# Patient Record
Sex: Female | Born: 1960 | Race: Black or African American | Hispanic: No | Marital: Married | Smoking: Never smoker
Health system: Southern US, Community
[De-identification: ages and names within clinical notes are randomized; demographics above are authoritative.]

## PROBLEM LIST (undated history)

## (undated) DIAGNOSIS — J45909 Unspecified asthma, uncomplicated: Secondary | ICD-10-CM

## (undated) DIAGNOSIS — K219 Gastro-esophageal reflux disease without esophagitis: Secondary | ICD-10-CM

## (undated) DIAGNOSIS — M1990 Unspecified osteoarthritis, unspecified site: Secondary | ICD-10-CM

## (undated) DIAGNOSIS — I10 Essential (primary) hypertension: Secondary | ICD-10-CM

## (undated) HISTORY — PX: ABDOMINAL HYSTERECTOMY: SHX81

## (undated) HISTORY — DX: Unspecified osteoarthritis, unspecified site: M19.90

---

## 1998-09-07 ENCOUNTER — Emergency Department (HOSPITAL_COMMUNITY): Admission: EM | Admit: 1998-09-07 | Discharge: 1998-09-07 | Payer: Self-pay | Admitting: Emergency Medicine

## 1999-07-11 ENCOUNTER — Inpatient Hospital Stay (HOSPITAL_COMMUNITY): Admission: AD | Admit: 1999-07-11 | Discharge: 1999-07-11 | Payer: Self-pay | Admitting: Obstetrics

## 1999-07-14 ENCOUNTER — Inpatient Hospital Stay (HOSPITAL_COMMUNITY): Admission: AD | Admit: 1999-07-14 | Discharge: 1999-07-14 | Payer: Self-pay | Admitting: *Deleted

## 1999-07-20 ENCOUNTER — Other Ambulatory Visit: Admission: RE | Admit: 1999-07-20 | Discharge: 1999-07-20 | Payer: Self-pay | Admitting: Obstetrics

## 1999-11-20 ENCOUNTER — Inpatient Hospital Stay (HOSPITAL_COMMUNITY): Admission: EM | Admit: 1999-11-20 | Discharge: 1999-11-20 | Payer: Self-pay | Admitting: Obstetrics

## 2000-02-20 ENCOUNTER — Inpatient Hospital Stay (HOSPITAL_COMMUNITY): Admission: AD | Admit: 2000-02-20 | Discharge: 2000-02-22 | Payer: Self-pay | Admitting: Obstetrics

## 2003-07-29 ENCOUNTER — Emergency Department (HOSPITAL_COMMUNITY): Admission: EM | Admit: 2003-07-29 | Discharge: 2003-07-29 | Payer: Self-pay | Admitting: Emergency Medicine

## 2006-04-25 ENCOUNTER — Emergency Department (HOSPITAL_COMMUNITY): Admission: EM | Admit: 2006-04-25 | Discharge: 2006-04-25 | Payer: Self-pay | Admitting: *Deleted

## 2006-12-01 ENCOUNTER — Ambulatory Visit (HOSPITAL_COMMUNITY): Admission: RE | Admit: 2006-12-01 | Discharge: 2006-12-01 | Payer: Self-pay | Admitting: Obstetrics

## 2007-02-10 ENCOUNTER — Emergency Department (HOSPITAL_COMMUNITY): Admission: EM | Admit: 2007-02-10 | Discharge: 2007-02-10 | Payer: Self-pay | Admitting: Emergency Medicine

## 2008-10-03 ENCOUNTER — Emergency Department (HOSPITAL_COMMUNITY): Admission: EM | Admit: 2008-10-03 | Discharge: 2008-10-03 | Payer: Self-pay | Admitting: Emergency Medicine

## 2008-12-27 ENCOUNTER — Emergency Department (HOSPITAL_BASED_OUTPATIENT_CLINIC_OR_DEPARTMENT_OTHER): Admission: EM | Admit: 2008-12-27 | Discharge: 2008-12-27 | Payer: Self-pay | Admitting: Emergency Medicine

## 2009-01-30 ENCOUNTER — Emergency Department (HOSPITAL_COMMUNITY): Admission: EM | Admit: 2009-01-30 | Discharge: 2009-01-30 | Payer: Self-pay | Admitting: Emergency Medicine

## 2009-08-25 ENCOUNTER — Emergency Department (HOSPITAL_BASED_OUTPATIENT_CLINIC_OR_DEPARTMENT_OTHER): Admission: EM | Admit: 2009-08-25 | Discharge: 2009-08-25 | Payer: Self-pay | Admitting: Emergency Medicine

## 2009-09-23 ENCOUNTER — Emergency Department (HOSPITAL_COMMUNITY): Admission: EM | Admit: 2009-09-23 | Discharge: 2009-09-23 | Payer: Self-pay | Admitting: Emergency Medicine

## 2009-10-14 ENCOUNTER — Emergency Department (HOSPITAL_BASED_OUTPATIENT_CLINIC_OR_DEPARTMENT_OTHER): Admission: EM | Admit: 2009-10-14 | Discharge: 2009-10-14 | Payer: Self-pay | Admitting: Emergency Medicine

## 2009-10-18 ENCOUNTER — Emergency Department (HOSPITAL_BASED_OUTPATIENT_CLINIC_OR_DEPARTMENT_OTHER): Admission: EM | Admit: 2009-10-18 | Discharge: 2009-10-18 | Payer: Self-pay | Admitting: Emergency Medicine

## 2009-10-25 ENCOUNTER — Encounter: Payer: Self-pay | Admitting: Emergency Medicine

## 2009-10-25 ENCOUNTER — Ambulatory Visit: Payer: Self-pay | Admitting: Diagnostic Radiology

## 2009-10-25 ENCOUNTER — Inpatient Hospital Stay (HOSPITAL_COMMUNITY): Admission: AD | Admit: 2009-10-25 | Discharge: 2009-10-25 | Payer: Self-pay | Admitting: Obstetrics & Gynecology

## 2009-10-28 ENCOUNTER — Emergency Department (HOSPITAL_COMMUNITY): Admission: EM | Admit: 2009-10-28 | Discharge: 2009-10-28 | Payer: Self-pay | Admitting: Family Medicine

## 2009-10-30 ENCOUNTER — Ambulatory Visit: Payer: Self-pay | Admitting: Obstetrics & Gynecology

## 2009-10-30 LAB — CONVERTED CEMR LAB
CA 125: 10.5 units/mL (ref 0.0–30.2)
Pap Smear: NEGATIVE

## 2009-12-15 ENCOUNTER — Ambulatory Visit: Payer: Self-pay | Admitting: Obstetrics & Gynecology

## 2009-12-15 ENCOUNTER — Inpatient Hospital Stay (HOSPITAL_COMMUNITY): Admission: RE | Admit: 2009-12-15 | Discharge: 2009-12-17 | Payer: Self-pay | Admitting: Obstetrics & Gynecology

## 2009-12-15 ENCOUNTER — Encounter: Payer: Self-pay | Admitting: Obstetrics & Gynecology

## 2010-01-02 ENCOUNTER — Inpatient Hospital Stay (HOSPITAL_COMMUNITY): Admission: AD | Admit: 2010-01-02 | Discharge: 2010-01-02 | Payer: Self-pay | Admitting: Obstetrics & Gynecology

## 2010-01-06 ENCOUNTER — Emergency Department (HOSPITAL_COMMUNITY): Admission: EM | Admit: 2010-01-06 | Discharge: 2010-01-06 | Payer: Self-pay | Admitting: Emergency Medicine

## 2010-02-13 ENCOUNTER — Inpatient Hospital Stay (HOSPITAL_COMMUNITY): Admission: AD | Admit: 2010-02-13 | Discharge: 2010-02-13 | Payer: Self-pay | Admitting: Obstetrics & Gynecology

## 2010-02-17 ENCOUNTER — Emergency Department (HOSPITAL_BASED_OUTPATIENT_CLINIC_OR_DEPARTMENT_OTHER): Admission: EM | Admit: 2010-02-17 | Discharge: 2010-02-17 | Payer: Self-pay | Admitting: Emergency Medicine

## 2010-05-26 ENCOUNTER — Ambulatory Visit: Payer: Self-pay | Admitting: Internal Medicine

## 2010-05-26 ENCOUNTER — Encounter (INDEPENDENT_AMBULATORY_CARE_PROVIDER_SITE_OTHER): Payer: Self-pay | Admitting: Family Medicine

## 2010-05-26 LAB — CONVERTED CEMR LAB: Microalb, Ur: 3.64 mg/dL — ABNORMAL HIGH (ref 0.00–1.89)

## 2010-06-16 ENCOUNTER — Encounter (INDEPENDENT_AMBULATORY_CARE_PROVIDER_SITE_OTHER): Payer: Self-pay | Admitting: Family Medicine

## 2010-06-16 LAB — CONVERTED CEMR LAB
ALT: 8 units/L (ref 0–35)
AST: 14 units/L (ref 0–37)
Albumin: 4 g/dL (ref 3.5–5.2)
Alkaline Phosphatase: 104 units/L (ref 39–117)
BUN: 11 mg/dL (ref 6–23)
Basophils Absolute: 0 10*3/uL (ref 0.0–0.1)
Basophils Relative: 0 % (ref 0–1)
CO2: 26 meq/L (ref 19–32)
Calcium: 9.5 mg/dL (ref 8.4–10.5)
Chloride: 103 meq/L (ref 96–112)
Cholesterol: 181 mg/dL (ref 0–200)
Creatinine, Ser: 0.55 mg/dL (ref 0.40–1.20)
Eosinophils Absolute: 0.5 10*3/uL (ref 0.0–0.7)
Eosinophils Relative: 6 % — ABNORMAL HIGH (ref 0–5)
Glucose, Bld: 84 mg/dL (ref 70–99)
HCT: 36.7 % (ref 36.0–46.0)
HDL: 51 mg/dL (ref >39)
Hemoglobin: 11.2 g/dL — ABNORMAL LOW (ref 12.0–15.0)
LDL Cholesterol: 103 mg/dL — ABNORMAL HIGH (ref 0–99)
Lymphocytes Relative: 50 % — ABNORMAL HIGH (ref 12–46)
Lymphs Abs: 4.2 10*3/uL — ABNORMAL HIGH (ref 0.7–4.0)
MCHC: 30.5 g/dL (ref 30.0–36.0)
MCV: 80.3 fL (ref 78.0–100.0)
Monocytes Absolute: 0.4 10*3/uL (ref 0.1–1.0)
Monocytes Relative: 5 % (ref 3–12)
Neutro Abs: 3.2 10*3/uL (ref 1.7–7.7)
Neutrophils Relative %: 39 % — ABNORMAL LOW (ref 43–77)
Platelets: 282 10*3/uL (ref 150–400)
Potassium: 4.1 meq/L (ref 3.5–5.3)
RBC: 4.57 M/uL (ref 3.87–5.11)
RDW: 19 % — ABNORMAL HIGH (ref 11.5–15.5)
Sodium: 143 meq/L (ref 135–145)
Total Bilirubin: 0.5 mg/dL (ref 0.3–1.2)
Total CHOL/HDL Ratio: 3.5
Total Protein: 7.4 g/dL (ref 6.0–8.3)
Triglycerides: 137 mg/dL (ref <150)
VLDL: 27 mg/dL (ref 0–40)
WBC: 8.3 10*3/uL (ref 4.0–10.5)

## 2010-07-01 ENCOUNTER — Encounter (INDEPENDENT_AMBULATORY_CARE_PROVIDER_SITE_OTHER): Payer: Self-pay | Admitting: *Deleted

## 2010-07-01 LAB — CONVERTED CEMR LAB: Ferritin: 7 ng/mL — ABNORMAL LOW (ref 10–291)

## 2010-07-21 ENCOUNTER — Encounter (INDEPENDENT_AMBULATORY_CARE_PROVIDER_SITE_OTHER): Payer: Self-pay | Admitting: Family Medicine

## 2010-07-21 LAB — CONVERTED CEMR LAB
BUN: 10 mg/dL (ref 6–23)
CO2: 29 meq/L (ref 19–32)
Calcium: 9.8 mg/dL (ref 8.4–10.5)
Chloride: 103 meq/L (ref 96–112)
Creatinine, Ser: 0.57 mg/dL (ref 0.40–1.20)
Glucose, Bld: 104 mg/dL — ABNORMAL HIGH (ref 70–99)
Potassium: 4 meq/L (ref 3.5–5.3)
Sodium: 142 meq/L (ref 135–145)

## 2010-08-05 ENCOUNTER — Ambulatory Visit: Payer: Self-pay | Admitting: Internal Medicine

## 2010-09-04 ENCOUNTER — Inpatient Hospital Stay (HOSPITAL_COMMUNITY)
Admission: AD | Admit: 2010-09-04 | Discharge: 2010-09-04 | Payer: Self-pay | Source: Home / Self Care | Attending: Obstetrics & Gynecology | Admitting: Obstetrics & Gynecology

## 2010-09-29 ENCOUNTER — Encounter
Admission: RE | Admit: 2010-09-29 | Discharge: 2010-09-29 | Payer: Self-pay | Source: Home / Self Care | Attending: Family Medicine | Admitting: Family Medicine

## 2010-10-01 ENCOUNTER — Encounter (INDEPENDENT_AMBULATORY_CARE_PROVIDER_SITE_OTHER): Payer: Self-pay | Admitting: Family Medicine

## 2010-10-01 ENCOUNTER — Ambulatory Visit (HOSPITAL_COMMUNITY)
Admission: RE | Admit: 2010-10-01 | Discharge: 2010-10-01 | Payer: Self-pay | Source: Home / Self Care | Attending: Family Medicine | Admitting: Family Medicine

## 2010-11-24 ENCOUNTER — Encounter (INDEPENDENT_AMBULATORY_CARE_PROVIDER_SITE_OTHER): Payer: Self-pay | Admitting: *Deleted

## 2010-11-24 LAB — CONVERTED CEMR LAB
BUN: 12 mg/dL (ref 6–23)
CO2: 30 meq/L (ref 19–32)
Calcium: 9.5 mg/dL (ref 8.4–10.5)
Chloride: 101 meq/L (ref 96–112)
Creatinine, Ser: 0.67 mg/dL (ref 0.40–1.20)
Glucose, Bld: 124 mg/dL — ABNORMAL HIGH (ref 70–99)
Potassium: 3.1 meq/L — ABNORMAL LOW (ref 3.5–5.3)
Sodium: 142 meq/L (ref 135–145)

## 2010-11-26 ENCOUNTER — Inpatient Hospital Stay (HOSPITAL_COMMUNITY)
Admission: AD | Admit: 2010-11-26 | Discharge: 2010-11-26 | Disposition: A | Discharge status: Home / Self Care | Payer: Self-pay | Source: Ambulatory Visit | Attending: Obstetrics & Gynecology | Admitting: Obstetrics & Gynecology

## 2010-11-26 DIAGNOSIS — N644 Mastodynia: Secondary | ICD-10-CM

## 2010-12-06 LAB — STOOL CULTURE

## 2010-12-06 LAB — COMPREHENSIVE METABOLIC PANEL
ALT: 9 U/L (ref 0–35)
Alkaline Phosphatase: 111 U/L (ref 39–117)
CO2: 31 mEq/L (ref 19–32)
Chloride: 101 mEq/L (ref 96–112)
GFR calc non Af Amer: >60 mL/min (ref >60)
Glucose, Bld: 95 mg/dL (ref 70–99)
Potassium: 3.2 mEq/L — ABNORMAL LOW (ref 3.5–5.1)
Sodium: 144 mEq/L (ref 135–145)
Total Bilirubin: 0.4 mg/dL (ref 0.3–1.2)

## 2010-12-06 LAB — CBC
HCT: 35.2 % — ABNORMAL LOW (ref 36.0–46.0)
Hemoglobin: 11.3 g/dL — ABNORMAL LOW (ref 12.0–15.0)
MCHC: 32 g/dL (ref 30.0–36.0)
MCV: 80.4 fL (ref 78.0–100.0)
Platelets: 327 10*3/uL (ref 150–400)
RBC: 4.38 MIL/uL (ref 3.87–5.11)
RDW: 16.5 % — ABNORMAL HIGH (ref 11.5–15.5)
WBC: 10.4 10*3/uL (ref 4.0–10.5)

## 2010-12-06 LAB — DIFFERENTIAL
Basophils Absolute: 0 10*3/uL (ref 0.0–0.1)
Basophils Relative: 0 % (ref 0–1)
Eosinophils Absolute: 0.6 10*3/uL (ref 0.0–0.7)
Monocytes Relative: 4 % (ref 3–12)
Neutrophils Relative %: 47 % (ref 43–77)

## 2010-12-06 LAB — CLOSTRIDIUM DIFFICILE EIA

## 2010-12-07 LAB — URINE MICROSCOPIC-ADD ON

## 2010-12-07 LAB — COMPREHENSIVE METABOLIC PANEL
AST: 17 U/L (ref 0–37)
Albumin: 3.6 g/dL (ref 3.5–5.2)
Calcium: 9 mg/dL (ref 8.4–10.5)
Creatinine, Ser: 0.56 mg/dL (ref 0.4–1.2)
GFR calc Af Amer: >60 mL/min (ref >60)
Total Protein: 7.2 g/dL (ref 6.0–8.3)

## 2010-12-07 LAB — URINALYSIS, ROUTINE W REFLEX MICROSCOPIC
Glucose, UA: NEGATIVE mg/dL
Leukocytes, UA: NEGATIVE
Protein, ur: NEGATIVE mg/dL
Urobilinogen, UA: 0.2 mg/dL (ref 0.0–1.0)

## 2010-12-07 LAB — CBC
MCHC: 32.7 g/dL (ref 30.0–36.0)
MCV: 79.3 fL (ref 78.0–100.0)
Platelets: 256 10*3/uL (ref 150–400)
WBC: 9.7 10*3/uL (ref 4.0–10.5)

## 2010-12-07 LAB — URINE CULTURE: Colony Count: 35000

## 2010-12-08 LAB — URINALYSIS, ROUTINE W REFLEX MICROSCOPIC
Ketones, ur: NEGATIVE mg/dL
Leukocytes, UA: NEGATIVE
Nitrite: NEGATIVE
Protein, ur: NEGATIVE mg/dL

## 2010-12-08 LAB — CBC
HCT: 32.7 % — ABNORMAL LOW (ref 36.0–46.0)
MCHC: 31.9 g/dL (ref 30.0–36.0)
MCV: 79.2 fL (ref 78.0–100.0)
RBC: 4.13 MIL/uL (ref 3.87–5.11)

## 2010-12-09 LAB — DIFFERENTIAL
Eosinophils Absolute: 0.6 10*3/uL (ref 0.0–0.7)
Eosinophils Relative: 7 % — ABNORMAL HIGH (ref 0–5)
Lymphocytes Relative: 41 % (ref 12–46)
Lymphs Abs: 3.7 10*3/uL (ref 0.7–4.0)
Monocytes Absolute: 0.6 10*3/uL (ref 0.1–1.0)
Monocytes Relative: 7 % (ref 3–12)

## 2010-12-09 LAB — BASIC METABOLIC PANEL
BUN: 7 mg/dL (ref 6–23)
Chloride: 103 mEq/L (ref 96–112)
GFR calc Af Amer: >60 mL/min (ref >60)
GFR calc non Af Amer: >60 mL/min (ref >60)
Potassium: 3.8 mEq/L (ref 3.5–5.1)
Sodium: 145 mEq/L (ref 135–145)

## 2010-12-09 LAB — CBC
HCT: 33.3 % — ABNORMAL LOW (ref 36.0–46.0)
Hemoglobin: 11 g/dL — ABNORMAL LOW (ref 12.0–15.0)
MCV: 79.6 fL (ref 78.0–100.0)
RBC: 4.18 MIL/uL (ref 3.87–5.11)
WBC: 8.9 10*3/uL (ref 4.0–10.5)

## 2010-12-09 LAB — URINALYSIS, ROUTINE W REFLEX MICROSCOPIC
Bilirubin Urine: NEGATIVE
Glucose, UA: NEGATIVE mg/dL
Ketones, ur: NEGATIVE mg/dL
Leukocytes, UA: NEGATIVE
Protein, ur: NEGATIVE mg/dL

## 2010-12-09 LAB — URINE MICROSCOPIC-ADD ON

## 2010-12-11 LAB — WET PREP, GENITAL
Clue Cells Wet Prep HPF POC: NONE SEEN
Trich, Wet Prep: NONE SEEN

## 2010-12-11 LAB — POCT PREGNANCY, URINE: Preg Test, Ur: NEGATIVE

## 2010-12-14 LAB — COMPREHENSIVE METABOLIC PANEL
AST: 16 U/L (ref 0–37)
Albumin: 3.7 g/dL (ref 3.5–5.2)
Alkaline Phosphatase: 102 U/L (ref 39–117)
BUN: 5 mg/dL — ABNORMAL LOW (ref 6–23)
Chloride: 101 mEq/L (ref 96–112)
Creatinine, Ser: 0.57 mg/dL (ref 0.4–1.2)
GFR calc Af Amer: >60 mL/min (ref >60)
Potassium: 3.5 mEq/L (ref 3.5–5.1)
Total Bilirubin: 0.4 mg/dL (ref 0.3–1.2)
Total Protein: 8.1 g/dL (ref 6.0–8.3)

## 2010-12-14 LAB — CBC
HCT: 27.3 % — ABNORMAL LOW (ref 36.0–46.0)
HCT: 34.5 % — ABNORMAL LOW (ref 36.0–46.0)
Hemoglobin: 8.9 g/dL — ABNORMAL LOW (ref 12.0–15.0)
MCV: 79.7 fL (ref 78.0–100.0)
MCV: 80.5 fL (ref 78.0–100.0)
Platelets: 260 10*3/uL (ref 150–400)
Platelets: 295 10*3/uL (ref 150–400)
RBC: 3.39 MIL/uL — ABNORMAL LOW (ref 3.87–5.11)
RBC: 3.58 MIL/uL — ABNORMAL LOW (ref 3.87–5.11)
RDW: 18.1 % — ABNORMAL HIGH (ref 11.5–15.5)
WBC: 15.1 10*3/uL — ABNORMAL HIGH (ref 4.0–10.5)
WBC: 9.4 10*3/uL (ref 4.0–10.5)
WBC: 9.7 10*3/uL (ref 4.0–10.5)

## 2010-12-14 LAB — TYPE AND SCREEN
ABO/RH(D): O NEG
Antibody Screen: NEGATIVE

## 2010-12-14 LAB — ABO/RH: ABO/RH(D): O NEG

## 2010-12-17 ENCOUNTER — Encounter: Payer: Self-pay | Admitting: Internal Medicine

## 2010-12-17 LAB — CONVERTED CEMR LAB
Potassium: 3.4 meq/L — ABNORMAL LOW (ref 3.5–5.3)
Sodium: 142 meq/L (ref 135–145)

## 2010-12-24 ENCOUNTER — Other Ambulatory Visit (HOSPITAL_COMMUNITY): Payer: Self-pay | Admitting: *Deleted

## 2010-12-24 ENCOUNTER — Other Ambulatory Visit (HOSPITAL_COMMUNITY): Payer: Self-pay | Admitting: Cardiology

## 2010-12-24 DIAGNOSIS — Z0389 Encounter for observation for other suspected diseases and conditions ruled out: Secondary | ICD-10-CM

## 2010-12-24 DIAGNOSIS — R079 Chest pain, unspecified: Secondary | ICD-10-CM

## 2010-12-24 DIAGNOSIS — I10 Essential (primary) hypertension: Secondary | ICD-10-CM

## 2010-12-24 DIAGNOSIS — C762 Malignant neoplasm of abdomen: Secondary | ICD-10-CM

## 2010-12-29 LAB — URINALYSIS, ROUTINE W REFLEX MICROSCOPIC
Glucose, UA: NEGATIVE mg/dL
Leukocytes, UA: NEGATIVE
Protein, ur: NEGATIVE mg/dL
pH: 5.5 (ref 5.0–8.0)

## 2010-12-29 LAB — CBC
HCT: 37 % (ref 36.0–46.0)
MCHC: 32.1 g/dL (ref 30.0–36.0)
Platelets: 262 10*3/uL (ref 150–400)
RDW: 17.4 % — ABNORMAL HIGH (ref 11.5–15.5)

## 2010-12-29 LAB — COMPREHENSIVE METABOLIC PANEL
CO2: 28 mEq/L (ref 19–32)
Calcium: 9.5 mg/dL (ref 8.4–10.5)
Creatinine, Ser: 0.61 mg/dL (ref 0.4–1.2)
GFR calc non Af Amer: >60 mL/min (ref >60)
Glucose, Bld: 105 mg/dL — ABNORMAL HIGH (ref 70–99)

## 2010-12-29 LAB — POCT PREGNANCY, URINE: Preg Test, Ur: NEGATIVE

## 2010-12-29 LAB — URINE MICROSCOPIC-ADD ON

## 2010-12-29 LAB — LIPASE, BLOOD: Lipase: 21 U/L (ref 11–59)

## 2011-01-04 LAB — URINALYSIS, ROUTINE W REFLEX MICROSCOPIC
Leukocytes, UA: NEGATIVE
Protein, ur: NEGATIVE mg/dL
Urobilinogen, UA: 0.2 mg/dL (ref 0.0–1.0)

## 2011-01-04 LAB — URINE MICROSCOPIC-ADD ON

## 2011-01-07 ENCOUNTER — Encounter (HOSPITAL_COMMUNITY)
Admission: RE | Admit: 2011-01-07 | Discharge: 2011-01-07 | Disposition: A | Discharge status: Home / Self Care | Payer: Self-pay | Source: Ambulatory Visit | Attending: Cardiology | Admitting: Cardiology

## 2011-01-07 ENCOUNTER — Ambulatory Visit (HOSPITAL_COMMUNITY): Payer: Self-pay

## 2011-01-07 DIAGNOSIS — E669 Obesity, unspecified: Secondary | ICD-10-CM | POA: Insufficient documentation

## 2011-01-07 DIAGNOSIS — Z0389 Encounter for observation for other suspected diseases and conditions ruled out: Secondary | ICD-10-CM

## 2011-01-07 DIAGNOSIS — I10 Essential (primary) hypertension: Secondary | ICD-10-CM | POA: Insufficient documentation

## 2011-01-07 DIAGNOSIS — R011 Cardiac murmur, unspecified: Secondary | ICD-10-CM | POA: Insufficient documentation

## 2011-01-07 DIAGNOSIS — R079 Chest pain, unspecified: Secondary | ICD-10-CM | POA: Insufficient documentation

## 2011-01-07 MED ORDER — TECHNETIUM TC 99M TETROFOSMIN IV KIT
10.0000 | PACK | Freq: Once | INTRAVENOUS | Status: AC | PRN
  Administered 2011-01-07: 10 via INTRAVENOUS

## 2011-01-07 MED ORDER — TECHNETIUM TC 99M TETROFOSMIN IV KIT
30.0000 | PACK | Freq: Once | INTRAVENOUS | Status: AC | PRN
  Administered 2011-01-07: 30 via INTRAVENOUS

## 2011-08-23 ENCOUNTER — Other Ambulatory Visit (HOSPITAL_COMMUNITY): Payer: Self-pay | Admitting: Family Medicine

## 2011-08-23 DIAGNOSIS — Z1231 Encounter for screening mammogram for malignant neoplasm of breast: Secondary | ICD-10-CM

## 2011-09-04 IMAGING — CR DG NECK SOFT TISSUE
1 series · 1 of 1 positions shown · non-contrast
Comparison: None

CLINICAL DATA: Sore throat.

NECK SOFT TISSUES - 1+ VIEW

[w soft tissue neck *]
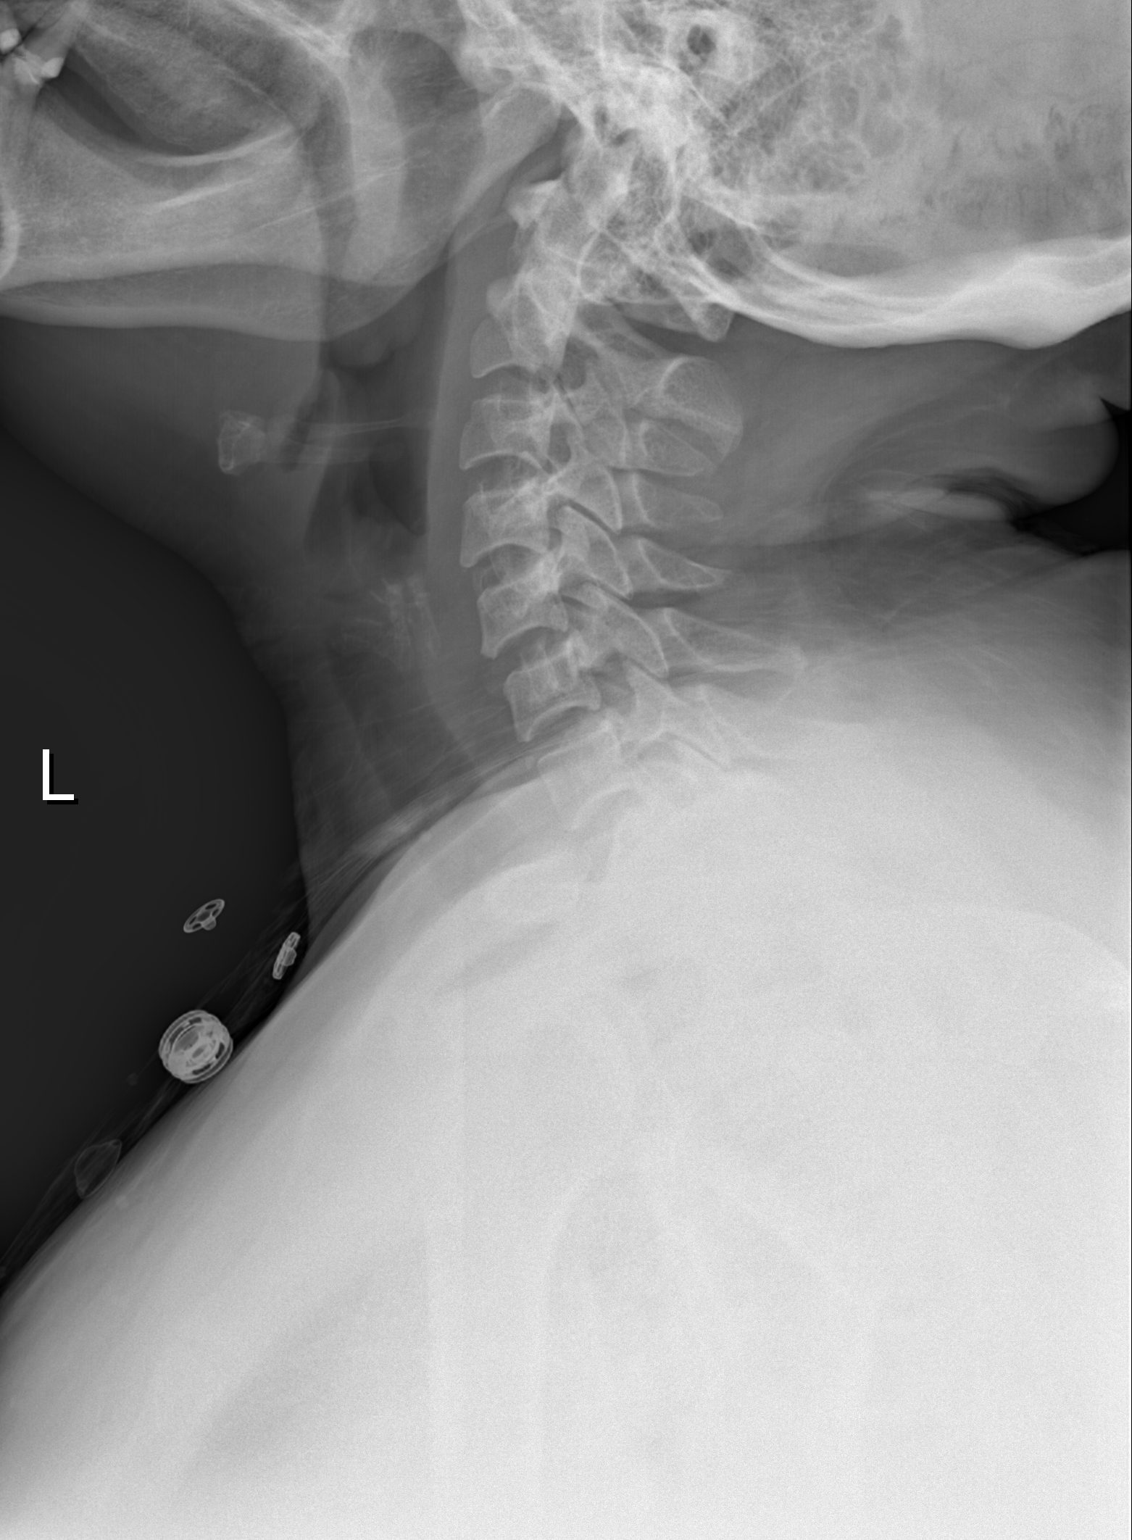

[1 of 1 positions shown; findings below may reference images not displayed]

FINDINGS: There is no abnormal prevertebral soft tissue swelling.
The epiglottis and base of the tongue are normal.  There is slight
prominence of the tonsils.

The bony structures of the cervical spine appear normal.
IMPRESSION: Slightly prominent tonsils.  Otherwise normal exam.

## 2011-10-04 ENCOUNTER — Ambulatory Visit (HOSPITAL_COMMUNITY)
Admission: RE | Admit: 2011-10-04 | Discharge: 2011-10-04 | Disposition: A | Discharge status: Home / Self Care | Payer: Self-pay | Source: Ambulatory Visit | Attending: Family Medicine | Admitting: Family Medicine

## 2011-10-04 DIAGNOSIS — Z1231 Encounter for screening mammogram for malignant neoplasm of breast: Secondary | ICD-10-CM | POA: Insufficient documentation

## 2012-02-16 ENCOUNTER — Other Ambulatory Visit: Payer: Self-pay | Admitting: Family Medicine

## 2012-02-16 ENCOUNTER — Ambulatory Visit (HOSPITAL_COMMUNITY)
Admission: RE | Admit: 2012-02-16 | Discharge: 2012-02-16 | Disposition: A | Discharge status: Home / Self Care | Payer: Self-pay | Source: Ambulatory Visit | Attending: Family Medicine | Admitting: Family Medicine

## 2012-02-16 DIAGNOSIS — M25579 Pain in unspecified ankle and joints of unspecified foot: Secondary | ICD-10-CM | POA: Insufficient documentation

## 2012-02-16 DIAGNOSIS — R200 Anesthesia of skin: Secondary | ICD-10-CM

## 2012-02-16 DIAGNOSIS — R52 Pain, unspecified: Secondary | ICD-10-CM

## 2012-02-16 DIAGNOSIS — M4120 Other idiopathic scoliosis, site unspecified: Secondary | ICD-10-CM | POA: Insufficient documentation

## 2012-02-16 DIAGNOSIS — R209 Unspecified disturbances of skin sensation: Secondary | ICD-10-CM | POA: Insufficient documentation

## 2012-02-16 DIAGNOSIS — M546 Pain in thoracic spine: Secondary | ICD-10-CM | POA: Insufficient documentation

## 2012-02-16 DIAGNOSIS — M7730 Calcaneal spur, unspecified foot: Secondary | ICD-10-CM | POA: Insufficient documentation

## 2012-05-25 ENCOUNTER — Encounter (HOSPITAL_BASED_OUTPATIENT_CLINIC_OR_DEPARTMENT_OTHER): Payer: Self-pay | Admitting: Emergency Medicine

## 2012-05-25 ENCOUNTER — Emergency Department (HOSPITAL_BASED_OUTPATIENT_CLINIC_OR_DEPARTMENT_OTHER)
Admission: EM | Admit: 2012-05-25 | Discharge: 2012-05-25 | Disposition: A | Discharge status: Home / Self Care | Payer: Self-pay | Attending: Emergency Medicine | Admitting: Emergency Medicine

## 2012-05-25 DIAGNOSIS — I10 Essential (primary) hypertension: Secondary | ICD-10-CM | POA: Insufficient documentation

## 2012-05-25 DIAGNOSIS — Z760 Encounter for issue of repeat prescription: Secondary | ICD-10-CM | POA: Insufficient documentation

## 2012-05-25 DIAGNOSIS — E119 Type 2 diabetes mellitus without complications: Secondary | ICD-10-CM | POA: Insufficient documentation

## 2012-05-25 DIAGNOSIS — R51 Headache: Secondary | ICD-10-CM | POA: Insufficient documentation

## 2012-05-25 HISTORY — DX: Essential (primary) hypertension: I10

## 2012-05-25 MED ORDER — IBUPROFEN 200 MG PO TABS
600.0000 mg | ORAL_TABLET | Freq: Once | ORAL | Status: AC
  Administered 2012-05-25: 600 mg via ORAL
  Filled 2012-05-25: qty 1

## 2012-05-25 MED ORDER — ATENOLOL-CHLORTHALIDONE 50-25 MG PO TABS: 1.0000 | ORAL_TABLET | Freq: Every day | ORAL | Status: DC

## 2012-05-25 MED ORDER — METFORMIN HCL 500 MG PO TABS: 500.0000 mg | ORAL_TABLET | Freq: Two times a day (BID) | ORAL | Status: DC

## 2012-05-25 NOTE — ED Notes (Addendum)
Pt c/o HA since yesterday- out of BP meds x 2 days- primary care was w/ [facility]

## 2012-05-27 NOTE — ED Provider Notes (Signed)
History     CSN: [id number]  Arrival date & time 05/25/12  1616   First MD Initiated Contact with Patient 05/25/12 1641      Chief Complaint  Patient presents with  . Headache    (Consider location/radiation/quality/duration/timing/severity/associated sxs/prior treatment) HPI Patient is a 51 year old female who presents today for evaluation of headache which began yesterday as well as refill of her chronic medications for diabetes and blood pressure. Patient reports her pain is only mild. She denies any numbness, tingling, paresthesias, nausea, vomiting, photophobia. She has not taken anything for her headache. Patient denies any head injury. Blood pressure is not significantly elevated today to suggest this being a result of hypertensive emergency. Patient mainly appears to be her today for medication refill. Headache is reported as a 1/10 on a pain level. It is an ache, bilateral, frontal, without associated fevers, rash, or neck stiffness. There no other associated or modifying factors. Past Medical History  Diagnosis Date  . Hypertension   . Diabetes mellitus     History reviewed. No pertinent past surgical history.  History reviewed. No pertinent family history.  History  Substance Use Topics  . Smoking status: Never Smoker   . Smokeless tobacco: Not on file  . Alcohol Use: No    OB History    Grav Para Term Preterm Abortions TAB SAB Ect Mult Living                  Review of Systems  Constitutional: Negative.   Eyes: Negative.   Respiratory: Negative.   Cardiovascular: Negative.   Gastrointestinal: Negative.   Genitourinary: Negative.   Musculoskeletal: Negative.   Skin: Negative.   Neurological: Positive for headaches.  Hematological: Negative.   Psychiatric/Behavioral: Negative.   All other systems reviewed and are negative.    Allergies  Review of patient's allergies indicates no known allergies.  Home Medications   Current Outpatient Rx  Name  Route Sig Dispense Refill  . ATENOLOL-CHLORTHALIDONE 50-25 MG PO TABS Oral Take 1 tablet by mouth daily.    Marland Kitchen METFORMIN HCL 500 MG PO TABS Oral Take 500 mg by mouth 2 (two) times daily with a meal.    . ATENOLOL-CHLORTHALIDONE 50-25 MG PO TABS Oral Take 1 tablet by mouth daily. 30 tablet 2  . METFORMIN HCL 500 MG PO TABS Oral Take 1 tablet (500 mg total) by mouth 2 (two) times daily with a meal. 60 tablet 2    BP 126/80  Pulse 62  Temp 98.6 F (37 C) (Oral)  Resp 16  SpO2 100%  Physical Exam  Nursing note and vitals reviewed. GEN: Well-developed, well-nourished female in no distress HEENT: Atraumatic, normocephalic. Oropharynx clear without erythema EYES: PERRLA BL, no scleral icterus. NECK: Trachea midline, no meningismus CV: regular rate and rhythm. No murmurs, rubs, or gallops PULM: No respiratory distress.  No crackles, wheezes, or rales. GI: soft, non-tender. No guarding, rebound, or tenderness. + bowel sounds  GU: deferred Neuro: cranial nerves grossly 2-12 intact, no abnormalities of strength or sensation, A and O x 3, no pronator drift, no dysmetria on finger to nose task bilaterally MSK: Patient moves all 4 extremities symmetrically, no deformity, edema, or injury noted Skin: No rashes petechiae, purpura, or jaundice Psych: no abnormality of mood   ED Course  Procedures (including critical care time)  Labs Reviewed - No data to display No results found.   1. Cephalgia   2. Medication refill       MDM  Patient  was evaluated by myself. Based on completely normal neurologic exam and very mild headache patient was treated with ibuprofen. She was given a prescription for both her metformin as well as her Tenoretic. 2 months of refills are provided as the [facility]. Resource list was provided for patient to seek out a new primary care physician. Patient was discharged in good condition. I no concern for any other life-threatening  or more serious cause of headache today including meningitis, intracranial process such as mass or bleeding, or migraine.        Cyndra Numbers, MD 05/27/12 [phone]

## 2012-07-30 ENCOUNTER — Emergency Department (HOSPITAL_BASED_OUTPATIENT_CLINIC_OR_DEPARTMENT_OTHER)
Admission: EM | Admit: 2012-07-30 | Discharge: 2012-07-30 | Disposition: A | Discharge status: Home / Self Care | Payer: Self-pay | Attending: Emergency Medicine | Admitting: Emergency Medicine

## 2012-07-30 ENCOUNTER — Emergency Department (HOSPITAL_BASED_OUTPATIENT_CLINIC_OR_DEPARTMENT_OTHER): Payer: Self-pay

## 2012-07-30 ENCOUNTER — Encounter (HOSPITAL_BASED_OUTPATIENT_CLINIC_OR_DEPARTMENT_OTHER): Payer: Self-pay | Admitting: *Deleted

## 2012-07-30 DIAGNOSIS — R0789 Other chest pain: Secondary | ICD-10-CM

## 2012-07-30 DIAGNOSIS — I10 Essential (primary) hypertension: Secondary | ICD-10-CM | POA: Insufficient documentation

## 2012-07-30 DIAGNOSIS — Z79899 Other long term (current) drug therapy: Secondary | ICD-10-CM | POA: Insufficient documentation

## 2012-07-30 DIAGNOSIS — R071 Chest pain on breathing: Secondary | ICD-10-CM | POA: Insufficient documentation

## 2012-07-30 DIAGNOSIS — E119 Type 2 diabetes mellitus without complications: Secondary | ICD-10-CM | POA: Insufficient documentation

## 2012-07-30 LAB — COMPREHENSIVE METABOLIC PANEL
Albumin: 4.1 g/dL (ref 3.5–5.2)
BUN: 11 mg/dL (ref 6–23)
Chloride: 99 mEq/L (ref 96–112)
Creatinine, Ser: 0.6 mg/dL (ref 0.50–1.10)
GFR calc Af Amer: >90 mL/min (ref >90)
GFR calc non Af Amer: >90 mL/min (ref >90)
Glucose, Bld: 101 mg/dL — ABNORMAL HIGH (ref 70–99)
Total Bilirubin: 0.3 mg/dL (ref 0.3–1.2)

## 2012-07-30 LAB — CBC
HCT: 39.1 % (ref 36.0–46.0)
Hemoglobin: 13.2 g/dL (ref 12.0–15.0)
MCV: 84.4 fL (ref 78.0–100.0)
RDW: 14.3 % (ref 11.5–15.5)
WBC: 9.8 10*3/uL (ref 4.0–10.5)

## 2012-07-30 LAB — TROPONIN I: Troponin I: <0.3 ng/mL (ref <0.30)

## 2012-07-30 MED ORDER — IBUPROFEN 800 MG PO TABS: 800.0000 mg | ORAL_TABLET | Freq: Three times a day (TID) | ORAL | Status: DC

## 2012-07-30 MED ORDER — HYDROCODONE-ACETAMINOPHEN 5-325 MG PO TABS
2.0000 | ORAL_TABLET | Freq: Once | ORAL | Status: AC
  Administered 2012-07-30: 2 via ORAL
  Filled 2012-07-30: qty 2

## 2012-07-30 MED ORDER — HYDROCODONE-ACETAMINOPHEN 5-325 MG PO TABS: 2.0000 | ORAL_TABLET | ORAL | Status: DC | PRN

## 2012-07-30 NOTE — ED Notes (Signed)
Pt presents to ED today with left sided chest pain that started over a month ago.  Pt does not speak english and husband translates stating that pain has worsened over the last 2 days.

## 2012-07-30 NOTE — ED Provider Notes (Signed)
History     CSN: [id number]  Arrival date & time 07/30/12  1147   First MD Initiated Contact with Patient 07/30/12 1207      Chief Complaint  Patient presents with  . Chest Pain    (Consider location/radiation/quality/duration/timing/severity/associated sxs/prior treatment) Patient is a 51 y.o. female presenting with chest pain. The history is provided by the patient and a relative. No language interpreter was used.  Chest Pain The chest pain began more  than 1 month ago. Chest pain occurs constantly. The chest pain is unchanged. The pain is associated with exertion. The severity of the pain is moderate. The quality of the pain is described as aching. The pain does not radiate. Chest pain is worsened by deep breathing and exertion.  Pertinent negatives for associated symptoms include no diaphoresis, no lower extremity edema, no numbness and no weakness. She tried nothing for the symptoms. There are no known risk factors.  Her past medical history is significant for diabetes and hypertension.   Pt reports chest pain   Past Medical History  Diagnosis Date  . Hypertension   . Diabetes mellitus     History reviewed. No pertinent past surgical history.  History reviewed. No pertinent family history.  History  Substance Use Topics  . Smoking status: Never Smoker   . Smokeless tobacco: Not on file  . Alcohol Use: No    OB History    Grav Para Term Preterm Abortions TAB SAB Ect Mult Living                  Review of Systems  Constitutional: Negative for diaphoresis.  Cardiovascular: Positive for chest pain.  Neurological: Negative for weakness and numbness.  All other systems reviewed and are negative.    Allergies  Review of patient's allergies indicates no known allergies.  Home Medications   Current Outpatient Rx  Name  Route  Sig  Dispense  Refill  . ATENOLOL-CHLORTHALIDONE 50-25 MG PO TABS   Oral   Take 1 tablet by mouth daily.         .  ATENOLOL-CHLORTHALIDONE 50-25 MG PO TABS   Oral   Take 1 tablet by mouth daily.   30 tablet   2   . METFORMIN HCL 500 MG PO TABS   Oral   Take 500 mg by mouth 2 (two) times daily with a meal.         . METFORMIN HCL 500 MG PO TABS   Oral   Take 1 tablet (500 mg total) by mouth 2 (two) times daily with a meal.   60 tablet   2     BP 156/79  Pulse 66  Temp 98.1 F (36.7 C) (Oral)  Resp 18  SpO2 100%  Physical Exam  Nursing note and vitals reviewed. Constitutional: She is oriented to person, place, and time. She appears well-developed and well-nourished.  HENT:  Head: Normocephalic and atraumatic.  Right Ear: External ear normal.  Left Ear: External ear normal.  Nose: Nose normal.  Mouth/Throat: Oropharynx is clear and moist.  Eyes: Pupils are equal, round, and reactive to light.  Neck: Normal range of motion. Neck supple.  Cardiovascular: Normal rate.   Pulmonary/Chest: Effort normal.  Abdominal: Soft.  Musculoskeletal: Normal range of motion.  Neurological: She is alert and oriented to person, place, and time. She has normal reflexes.  Skin: Skin is warm.  Psychiatric: She has a normal mood and affect.    ED Course  Procedures (including critical  care time)   Labs Reviewed  CBC  COMPREHENSIVE METABOLIC PANEL  TROPONIN I   No results found.   No diagnosis found.    MDM   Results for orders placed during the hospital encounter of 07/30/12  CBC      Component Value Range   WBC 9.8  4.0 - 10.5 K/uL   RBC 4.63  3.87 - 5.11 MIL/uL   Hemoglobin 13.2  12.0 - 15.0 g/dL   HCT 16.1  09.6 - 04.5 %   MCV 84.4  78.0 - 100.0 fL   MCH 28.5  26.0 - 34.0 pg   MCHC 33.8  30.0 - 36.0 g/dL   RDW 40.9  81.1 - 91.4 %   Platelets 260  150 - 400 K/uL  COMPREHENSIVE METABOLIC PANEL      Component Value Range   Sodium 141  135 - 145 mEq/L   Potassium 3.1 (*) 3.5 - 5.1 mEq/L   Chloride 99  96 - 112 mEq/L   CO2 29  19 - 32 mEq/L   Glucose, Bld 101 (*) 70 - 99  mg/dL   BUN 11  6 - 23 mg/dL   Creatinine, Ser 7.82  0.50 - 1.10 mg/dL   Calcium 95.6  8.4 - 21.3 mg/dL   Total Protein 8.6 (*) 6.0 - 8.3 g/dL   Albumin 4.1  3.5 - 5.2 g/dL   AST 23  0 - 37 U/L   ALT 16  0 - 35 U/L   Alkaline Phosphatase 114  39 - 117 U/L   Total Bilirubin 0.3  0.3 - 1.2 mg/dL   GFR calc non Af Amer >90  >90 mL/min   GFR calc Af Amer >90  >90 mL/min  TROPONIN I      Component Value Range   Troponin I <0.30  <0.30 ng/mL   Dg Chest 2 View  07/30/2012  *RADIOLOGY REPORT*  Clinical Data: Left-sided chest pain for 1 month.  History of hypertension and diabetes.  CHEST - 2 VIEW  Comparison: Thoracic spine study 02/16/2012  Findings: Heart is enlarged.  The lungs are free of focal consolidations and pleural effusions.  Degenerative changes are seen in the spine.  IMPRESSION: Cardiomegaly without pulmonary edema.   Original Report Authenticated By: Norva Pavlov, M.D.     Date: 07/30/2012  Rate: 64  Rhythm: normal sinus rhythm  QRS Axis: normal  Intervals: normal  ST/T Wave abnormalities: normal and nonspecific ST changes  Conduction Disutrbances:none  Narrative Interpretation:   Old EKG Reviewed: unchanged    Pt given 2 hydrocodone for pain.   Pain is reproduced by using arm and with pressure on her chest.   I will treat with ibuprofen and hydrocodone.   Pt advised to see her primary care MD for recheck.      Lonia Skinner [place], [place] 07/30/12 [phone]

## 2012-10-04 ENCOUNTER — Encounter (HOSPITAL_BASED_OUTPATIENT_CLINIC_OR_DEPARTMENT_OTHER): Payer: Self-pay | Admitting: Family Medicine

## 2012-10-04 ENCOUNTER — Emergency Department (HOSPITAL_BASED_OUTPATIENT_CLINIC_OR_DEPARTMENT_OTHER)
Admission: EM | Admit: 2012-10-04 | Discharge: 2012-10-04 | Disposition: A | Discharge status: Home / Self Care | Payer: No Typology Code available for payment source | Attending: Emergency Medicine | Admitting: Emergency Medicine

## 2012-10-04 DIAGNOSIS — Z760 Encounter for issue of repeat prescription: Secondary | ICD-10-CM | POA: Insufficient documentation

## 2012-10-04 DIAGNOSIS — E119 Type 2 diabetes mellitus without complications: Secondary | ICD-10-CM | POA: Insufficient documentation

## 2012-10-04 DIAGNOSIS — I10 Essential (primary) hypertension: Secondary | ICD-10-CM | POA: Insufficient documentation

## 2012-10-04 LAB — RPR: RPR Ser Ql: NONREACTIVE

## 2012-10-04 MED ORDER — METFORMIN HCL 500 MG PO TABS: 500.0000 mg | ORAL_TABLET | Freq: Two times a day (BID) | ORAL | Status: DC

## 2012-10-04 MED ORDER — ATENOLOL-CHLORTHALIDONE 50-25 MG PO TABS: 1.0000 | ORAL_TABLET | Freq: Every day | ORAL | Status: DC

## 2012-10-04 NOTE — ED Notes (Addendum)
Pt sts she needs refill for BP and DM meds. Pt sts she has been out of med x 2 days. Pt sts she checked [facility] last night and it was it 150.

## 2012-10-04 NOTE — ED Provider Notes (Signed)
History     CSN: [id number]  Arrival date & time 10/04/12  1047   First MD Initiated Contact with Patient 10/04/12 1257      Chief Complaint  Patient presents with  . Medication Refill    (Consider location/radiation/quality/duration/timing/severity/associated sxs/prior treatment) The history is provided by the patient.   patient presents with the need for medication refill. She's been out of her blood pressure medicines and metformin for a few days. She's been seen at all [facility], but was not one particular doctor since she left. She is without symptoms. No chest pain. No urinary frequency.  Past Medical History  Diagnosis Date  . Hypertension   . Diabetes mellitus     Past Surgical History  Procedure Date  . Abdominal hysterectomy     No family history on file.  History  Substance Use Topics  . Smoking status: Never Smoker   . Smokeless tobacco: Not on file  . Alcohol Use: No    OB History    Grav Para Term Preterm Abortions TAB SAB Ect Mult Living                  Review of Systems  Constitutional: Negative for fever, chills and appetite change.  Respiratory: Negative for chest tightness and shortness of breath.   Cardiovascular: Negative for leg swelling.  Genitourinary: Negative for frequency.  Musculoskeletal: Negative for back pain.    Allergies  Review of patient's allergies indicates no known allergies.  Home Medications   Current Outpatient Rx  Name  Route  Sig  Dispense  Refill  . ATENOLOL-CHLORTHALIDONE 50-25 MG PO TABS   Oral   Take 1 tablet by mouth daily.         . ATENOLOL-CHLORTHALIDONE 50-25 MG PO TABS   Oral   Take 1 tablet by mouth daily.   30 tablet   2   . HYDROCODONE-ACETAMINOPHEN 5-325 MG PO TABS   Oral   Take 2 tablets by mouth every 4 (four) hours as needed for pain.   10 tablet   0   . IBUPROFEN 800 MG PO TABS   Oral   Take 1 tablet (800 mg total) by mouth 3 (three) times daily.   21 tablet   0   .  METFORMIN HCL 500 MG PO TABS   Oral   Take 500 mg by mouth 2 (two) times daily with a meal.         . METFORMIN HCL 500 MG PO TABS   Oral   Take 1 tablet (500 mg total) by mouth 2 (two) times daily with a meal.   60 tablet   2     BP 117/68  Pulse 72  Temp 97.7 F (36.5 C) (Oral)  Resp 16  Ht 5\' 6"  (1.676 m)  Wt 225 lb (102.059 kg)  BMI 36.32 kg/m2  SpO2 100%  Physical Exam  Constitutional: She appears well-developed.  HENT:  Head: Normocephalic.  Cardiovascular: Normal rate and regular rhythm.   Pulmonary/Chest: No respiratory distress.  Abdominal: There is no tenderness.    ED Course  Procedures (including critical care time)   Labs Reviewed  GLUCOSE, CAPILLARY  RPR   No results found.   1. Encounter for medication refill       MDM  Patient shows up for medication refill. Sugar is normal. She be discharged home.        Juliet Rude. Rubin Payor, MD 10/04/12 1308

## 2012-11-14 NOTE — ED Provider Notes (Signed)
Medical screening examination/treatment/procedure(s) were performed by non-physician practitioner and as supervising physician I was immediately available for consultation/collaboration.   Loren Racer, MD 11/14/12 [phone]

## 2012-11-16 ENCOUNTER — Other Ambulatory Visit (HOSPITAL_COMMUNITY): Payer: Self-pay | Admitting: Internal Medicine

## 2012-11-24 ENCOUNTER — Ambulatory Visit (HOSPITAL_COMMUNITY): Payer: No Typology Code available for payment source

## 2012-11-30 ENCOUNTER — Ambulatory Visit (HOSPITAL_COMMUNITY)
Admission: RE | Admit: 2012-11-30 | Discharge: 2012-11-30 | Disposition: A | Discharge status: Home / Self Care | Payer: No Typology Code available for payment source | Source: Ambulatory Visit | Attending: Internal Medicine | Admitting: Internal Medicine

## 2012-11-30 DIAGNOSIS — Z1231 Encounter for screening mammogram for malignant neoplasm of breast: Secondary | ICD-10-CM | POA: Insufficient documentation

## 2013-10-11 ENCOUNTER — Other Ambulatory Visit (HOSPITAL_COMMUNITY): Payer: Self-pay | Admitting: Internal Medicine

## 2013-10-11 ENCOUNTER — Ambulatory Visit (HOSPITAL_COMMUNITY)
Admission: RE | Admit: 2013-10-11 | Discharge: 2013-10-11 | Disposition: A | Discharge status: Home / Self Care | Payer: No Typology Code available for payment source | Source: Ambulatory Visit | Attending: Internal Medicine | Admitting: Internal Medicine

## 2013-10-11 DIAGNOSIS — R079 Chest pain, unspecified: Secondary | ICD-10-CM | POA: Insufficient documentation

## 2013-10-11 DIAGNOSIS — R52 Pain, unspecified: Secondary | ICD-10-CM

## 2013-10-20 ENCOUNTER — Encounter: Payer: Self-pay | Admitting: *Deleted

## 2013-10-20 DIAGNOSIS — I10 Essential (primary) hypertension: Secondary | ICD-10-CM | POA: Insufficient documentation

## 2013-10-30 ENCOUNTER — Other Ambulatory Visit (HOSPITAL_COMMUNITY): Payer: Self-pay | Admitting: Internal Medicine

## 2014-01-30 ENCOUNTER — Other Ambulatory Visit (HOSPITAL_COMMUNITY): Payer: Self-pay | Admitting: Nurse Practitioner

## 2014-01-30 DIAGNOSIS — Z1231 Encounter for screening mammogram for malignant neoplasm of breast: Secondary | ICD-10-CM

## 2014-02-05 ENCOUNTER — Ambulatory Visit (HOSPITAL_COMMUNITY): Payer: Self-pay

## 2014-02-06 ENCOUNTER — Ambulatory Visit (HOSPITAL_COMMUNITY): Payer: Self-pay

## 2014-02-08 ENCOUNTER — Ambulatory Visit (HOSPITAL_COMMUNITY)
Admission: RE | Admit: 2014-02-08 | Discharge: 2014-02-08 | Disposition: A | Discharge status: Home / Self Care | Payer: No Typology Code available for payment source | Source: Ambulatory Visit | Attending: Nurse Practitioner | Admitting: Nurse Practitioner

## 2014-02-08 DIAGNOSIS — Z1231 Encounter for screening mammogram for malignant neoplasm of breast: Secondary | ICD-10-CM | POA: Insufficient documentation

## 2014-05-17 ENCOUNTER — Emergency Department (HOSPITAL_BASED_OUTPATIENT_CLINIC_OR_DEPARTMENT_OTHER)
Admission: EM | Admit: 2014-05-17 | Discharge: 2014-05-17 | Disposition: A | Discharge status: Home / Self Care | Payer: No Typology Code available for payment source | Attending: Emergency Medicine | Admitting: Emergency Medicine

## 2014-05-17 ENCOUNTER — Emergency Department (HOSPITAL_BASED_OUTPATIENT_CLINIC_OR_DEPARTMENT_OTHER): Payer: No Typology Code available for payment source

## 2014-05-17 ENCOUNTER — Encounter (HOSPITAL_BASED_OUTPATIENT_CLINIC_OR_DEPARTMENT_OTHER): Payer: Self-pay | Admitting: Emergency Medicine

## 2014-05-17 DIAGNOSIS — Z79899 Other long term (current) drug therapy: Secondary | ICD-10-CM | POA: Insufficient documentation

## 2014-05-17 DIAGNOSIS — R1084 Generalized abdominal pain: Secondary | ICD-10-CM

## 2014-05-17 DIAGNOSIS — Z791 Long term (current) use of non-steroidal anti-inflammatories (NSAID): Secondary | ICD-10-CM | POA: Insufficient documentation

## 2014-05-17 DIAGNOSIS — Z90710 Acquired absence of both cervix and uterus: Secondary | ICD-10-CM | POA: Insufficient documentation

## 2014-05-17 DIAGNOSIS — E876 Hypokalemia: Secondary | ICD-10-CM

## 2014-05-17 DIAGNOSIS — K1370 Unspecified lesions of oral mucosa: Secondary | ICD-10-CM | POA: Insufficient documentation

## 2014-05-17 DIAGNOSIS — E119 Type 2 diabetes mellitus without complications: Secondary | ICD-10-CM | POA: Insufficient documentation

## 2014-05-17 DIAGNOSIS — I10 Essential (primary) hypertension: Secondary | ICD-10-CM | POA: Insufficient documentation

## 2014-05-17 DIAGNOSIS — J029 Acute pharyngitis, unspecified: Secondary | ICD-10-CM | POA: Insufficient documentation

## 2014-05-17 LAB — COMPREHENSIVE METABOLIC PANEL
ALBUMIN: 3.9 g/dL (ref 3.5–5.2)
ALT: 21 U/L (ref 0–35)
ANION GAP: 15 (ref 5–15)
AST: 26 U/L (ref 0–37)
Alkaline Phosphatase: 105 U/L (ref 39–117)
BUN: 9 mg/dL (ref 6–23)
CO2: 29 mEq/L (ref 19–32)
CREATININE: 0.6 mg/dL (ref 0.50–1.10)
Calcium: 10.7 mg/dL — ABNORMAL HIGH (ref 8.4–10.5)
Chloride: 99 mEq/L (ref 96–112)
GFR calc Af Amer: >90 mL/min (ref >90)
GFR calc non Af Amer: >90 mL/min (ref >90)
Glucose, Bld: 135 mg/dL — ABNORMAL HIGH (ref 70–99)
Potassium: 2.8 mEq/L — CL (ref 3.7–5.3)
Sodium: 143 mEq/L (ref 137–147)
TOTAL PROTEIN: 8.6 g/dL — AB (ref 6.0–8.3)
Total Bilirubin: 0.4 mg/dL (ref 0.3–1.2)

## 2014-05-17 LAB — URINE MICROSCOPIC-ADD ON

## 2014-05-17 LAB — CBC WITH DIFFERENTIAL/PLATELET
BASOS PCT: 0 % (ref 0–1)
Basophils Absolute: 0 10*3/uL (ref 0.0–0.1)
EOS ABS: 0.5 10*3/uL (ref 0.0–0.7)
Eosinophils Relative: 4 % (ref 0–5)
HEMATOCRIT: 38.9 % (ref 36.0–46.0)
HEMOGLOBIN: 13.2 g/dL (ref 12.0–15.0)
Lymphocytes Relative: 41 % (ref 12–46)
Lymphs Abs: 4.6 10*3/uL — ABNORMAL HIGH (ref 0.7–4.0)
MCH: 29.1 pg (ref 26.0–34.0)
MCHC: 33.9 g/dL (ref 30.0–36.0)
MCV: 85.7 fL (ref 78.0–100.0)
MONO ABS: 0.5 10*3/uL (ref 0.1–1.0)
MONOS PCT: 5 % (ref 3–12)
NEUTROS PCT: 50 % (ref 43–77)
Neutro Abs: 5.6 10*3/uL (ref 1.7–7.7)
Platelets: 242 10*3/uL (ref 150–400)
RBC: 4.54 MIL/uL (ref 3.87–5.11)
RDW: 14.4 % (ref 11.5–15.5)
WBC: 11.2 10*3/uL — ABNORMAL HIGH (ref 4.0–10.5)

## 2014-05-17 LAB — URINALYSIS, ROUTINE W REFLEX MICROSCOPIC
BILIRUBIN URINE: NEGATIVE
Glucose, UA: NEGATIVE mg/dL
Ketones, ur: NEGATIVE mg/dL
Leukocytes, UA: NEGATIVE
NITRITE: NEGATIVE
Protein, ur: NEGATIVE mg/dL
SPECIFIC GRAVITY, URINE: 1.007 (ref 1.005–1.030)
UROBILINOGEN UA: 0.2 mg/dL (ref 0.0–1.0)
pH: 7 (ref 5.0–8.0)

## 2014-05-17 LAB — LIPASE, BLOOD: LIPASE: 29 U/L (ref 11–59)

## 2014-05-17 MED ORDER — IOHEXOL 300 MG/ML  SOLN
50.0000 mL | Freq: Once | INTRAMUSCULAR | Status: AC | PRN
  Administered 2014-05-17: 50 mL via ORAL

## 2014-05-17 MED ORDER — POTASSIUM CHLORIDE CRYS ER 20 MEQ PO TBCR: 20.0000 meq | EXTENDED_RELEASE_TABLET | Freq: Two times a day (BID) | ORAL | Status: DC

## 2014-05-17 MED ORDER — IOHEXOL 300 MG/ML  SOLN
100.0000 mL | Freq: Once | INTRAMUSCULAR | Status: AC | PRN
  Administered 2014-05-17: 100 mL via INTRAVENOUS

## 2014-05-17 MED ORDER — ONDANSETRON HCL 4 MG/2ML IJ SOLN
4.0000 mg | Freq: Once | INTRAMUSCULAR | Status: DC
  Filled 2014-05-17: qty 2

## 2014-05-17 MED ORDER — POTASSIUM CHLORIDE 20 MEQ/15ML (10%) PO LIQD
60.0000 meq | Freq: Once | ORAL | Status: AC
  Administered 2014-05-17: 60 meq via ORAL
  Filled 2014-05-17: qty 45

## 2014-05-17 MED ORDER — SODIUM CHLORIDE 0.9 % IV BOLUS (SEPSIS)
1000.0000 mL | Freq: Once | INTRAVENOUS | Status: AC
Start: 2014-05-17 — End: 2014-05-17
  Administered 2014-05-17: 1000 mL via INTRAVENOUS

## 2014-05-17 NOTE — Discharge Instructions (Signed)
Please recheck her potassium and her primary care provider next week. There are also  multiple minor abnormalities seen on CT scan, please discuss this with your provider to decide if more assessment is needed as an outpatient  Abdominal Pain Many things can cause abdominal pain. Usually, abdominal pain is not caused by a disease and will improve without treatment. It can often be observed and treated at home. Your health care provider will do a physical exam and possibly order blood tests and X-rays to help determine the seriousness of your pain. However, in many cases, more time must pass before a clear cause of the pain can be found. Before that point, your health care provider may not know if you need more testing or further treatment. HOME CARE INSTRUCTIONS  Monitor your abdominal pain for any changes. The following actions may help to alleviate any discomfort you are experiencing:  Only take over-the-counter or prescription medicines as directed by your health care provider.  Do not take laxatives unless directed to do so by your health care provider.  Try a clear liquid diet (broth, tea, or water) as directed by your health care provider. Slowly move to a bland diet as tolerated. SEEK MEDICAL CARE IF:  You have unexplained abdominal pain.  You have abdominal pain associated with nausea or diarrhea.  You have pain when you urinate or have a bowel movement.  You experience abdominal pain that wakes you in the night.  You have abdominal pain that is worsened or improved by eating food.  You have abdominal pain that is worsened with eating fatty foods.  You have a fever. SEEK IMMEDIATE MEDICAL CARE IF:   Your pain does not go away within 2 hours.  You keep throwing up (vomiting).  Your pain is felt only in portions of the abdomen, such as the right side or the left lower portion of the abdomen.  You pass bloody or black tarry stools. MAKE SURE YOU:  Understand these  instructions.   Will watch your condition.   Will get help right away if you are not doing well or get worse.  Document Released: 06/16/2005 Document Revised: 09/11/2013 Document Reviewed: 05/16/2013 [facility] Patient Information 2015 [place], [place]. This information is not intended to replace advice given to you by your health care provider. Make sure you discuss any questions you have with your health care provider.

## 2014-05-17 NOTE — ED Notes (Signed)
Critical Value called from lab.  Potassium 2.8 Dr Jeanell Sparrow notified

## 2014-05-17 NOTE — ED Provider Notes (Addendum)
CSN: [id number]     Arrival date & time 05/17/14  1058 History   First MD Initiated Contact with Patient 05/17/14 1123     Chief Complaint  Patient presents with  . Abdominal Pain    x 2weeks  . Mouth Lesions    top of mouth is red x 3 days     (Consider location/radiation/quality/duration/timing/severity/associated sxs/prior Treatment) HPI 53 y.o. Female with history of hypertension, diabetes mellitus, who presents with complaints of abdominal pain for one week.  Pain is crampy and diffuse.  Pain comes and goes but patient is unable to cite factors causing or relieving pain.  She reports normal bm, appetite, and urination.  She has not had similar pain before.  She has previously had an abdominal hysterectomy.  Her bs have been normal.  Past Medical History  Diagnosis Date  . Hypertension   . Diabetes mellitus    Past Surgical History  Procedure Laterality Date  . Abdominal hysterectomy     History reviewed. No pertinent family history. History  Substance Use Topics  . Smoking status: Never Smoker   . Smokeless tobacco: Not on file  . Alcohol Use: No   OB History   Grav Para Term Preterm Abortions TAB SAB Ect Mult Living                 Review of Systems  All other systems reviewed and are negative.     Allergies  Review of patient's allergies indicates no known allergies.  Home Medications   Prior to Admission medications   Medication Sig Start Date End Date Taking? Authorizing Provider  atenolol-chlorthalidone (TENORETIC) 50-25 MG per tablet Take 1 tablet by mouth daily.    Historical Provider, MD  HYDROcodone-acetaminophen (NORCO/VICODIN) 5-325 MG per tablet Take 2 tablets by mouth every 4 (four) hours as needed for pain. 07/30/12   Fransico Meadow, PA-C  ibuprofen (ADVIL,MOTRIN) 800 MG tablet Take 1 tablet (800 mg total) by mouth 3 (three) times daily. 07/30/12   Fransico Meadow, PA-C  metFORMIN (GLUCOPHAGE) 500 MG tablet Take 500 mg by mouth 2 (two) times daily  with a meal.    Historical Provider, MD  metFORMIN (GLUCOPHAGE) 500 MG tablet Take 1 tablet (500 mg total) by mouth 2 (two) times daily with a meal. 10/04/12 10/04/13  Jasper Riling. Pickering, MD   BP 143/88  Pulse 65  Temp(Src) 98.8 F (37.1 C) (Oral)  Resp 18  Ht 5\' 4"  (1.626 m)  Wt 220 lb (99.791 kg)  BMI 37.74 kg/m2  SpO2 100% Physical Exam  Nursing note and vitals reviewed. Constitutional: She is oriented to person, place, and time. She appears well-developed and well-nourished.  HENT:  Head: Normocephalic and atraumatic.  Right Ear: External ear normal.  Left Ear: External ear normal.  Nose: Nose normal.  Erythema pharynx  Eyes: Conjunctivae and EOM are normal. Pupils are equal, round, and reactive to light.  Neck: Normal range of motion. Neck supple.  Cardiovascular: Normal rate, regular rhythm, normal heart sounds and intact distal pulses.   Pulmonary/Chest: Effort normal and breath sounds normal.  Abdominal: Soft. Bowel sounds are normal. There is tenderness.  Mild diffuse ttp  Musculoskeletal: Normal range of motion. She exhibits no edema.  Neurological: She is alert and oriented to person, place, and time. She has normal reflexes.  Skin: Skin is warm and dry.  Psychiatric: She has a normal mood and affect. Her behavior is normal. Judgment and thought content normal.    ED  Course  Procedures (including critical care time) Labs Review Labs Reviewed  CBC WITH DIFFERENTIAL - Abnormal; Notable for the following:    WBC 11.2 (*)    Lymphs Abs 4.6 (*)    All other components within normal limits  COMPREHENSIVE METABOLIC PANEL - Abnormal; Notable for the following:    Potassium 2.8 (*)    Glucose, Bld 135 (*)    Calcium 10.7 (*)    Total Protein 8.6 (*)    All other components within normal limits  LIPASE, BLOOD  URINALYSIS, ROUTINE W REFLEX MICROSCOPIC    Imaging Review Ct Abdomen Pelvis W Contrast  05/17/2014   CLINICAL DATA:  Abdominal pain  EXAM: CT ABDOMEN AND  PELVIS WITH CONTRAST  TECHNIQUE: Multidetector CT imaging of the abdomen and pelvis was performed using the standard protocol following bolus administration of intravenous contrast.  CONTRAST:  33mL OMNIPAQUE IOHEXOL 300 MG/ML SOLN, 121mL OMNIPAQUE IOHEXOL 300 MG/ML SOLN  COMPARISON:  CT 02/13/2010  FINDINGS: Visualization the lower thorax demonstrates minimal dependent atelectasis. Normal heart size.  Liver is diffusely low in attenuation, compatible with hepatic steatosis. Portal vein is patent. Stable probable flash filling hemangioma within the right hepatic lobe. Gallbladder is unremarkable.  Spleen, pancreas and bilateral adrenal glands are unremarkable. Grossly stable bilateral low-attenuation renal lesions. Simple cyst within the inferior pole of the right kidney. No hydronephrosis.  Normal caliber abdominal aorta. No retroperitoneal lymphadenopathy urinary bladder is unremarkable. Uterus is unremarkable. No abnormal bowel wall thickening or evidence for bowel obstruction. No free fluid or free intraperitoneal air. Mild nonspecific thickening of the distal esophagus.  Lower lumbar spine degenerative change. No aggressive or acute appearing osseous lesions.  IMPRESSION: No cause for acute abdominal pain identified.  Hepatic steatosis.  Nonspecific distal esophageal wall thickening, potentially secondary to reflux and/or esophagitis. Consider correlation with endoscopy as clinically indicated.   Electronically Signed   By: Lovey Newcomer M.D.   On: 05/17/2014 13:13     EKG Interpretation   Date/Time:  Friday May 17 2014 13:00:59 EDT Ventricular Rate:  61 PR Interval:  172 QRS Duration: 94 QT Interval:  412 QTC Calculation: 414 R Axis:   12 Text Interpretation:  Normal sinus rhythm Normal ECG Confirmed by Jody Aguinaga MD,  Andee Poles [phone]) on 05/17/2014 2:49:23 PM      MDM   53 year old woman presents with nonspecific abdominal pain and pharyngitis. No specific cause abdominal pain is seen the patient  appears stable here. I discussed the findings with her and need for followup. The pharyngitis appears to likely be viral in etiology. She was hypokalemic here for potassium of 2.8 and asymptomatic. EKG reviewed does not show any acute changes. She is having oral potassium repletion. This will continue to palpation she is advised regarding followup to  1 abdominal pain nonspecific pain and no definitive etiology found 2 hypokalemia oral repletion psyllium patient have a oral repletion outpatient and have it rechecked for primary care physician. 3 distal esophageal l wall thickening seen on distal esophagus on CT. Patient referred to followup with GI. 4 hepatic steatosis 5 cyst right kidney    Shaune Pollack, MD 05/17/14 [place], MD 05/17/14 1452

## 2014-05-17 NOTE — ED Notes (Signed)
Pt returned from CT °

## 2014-05-17 NOTE — ED Notes (Signed)
Pt transported to CT ?

## 2014-07-03 ENCOUNTER — Other Ambulatory Visit: Payer: Self-pay | Admitting: *Deleted

## 2014-07-05 ENCOUNTER — Ambulatory Visit
Admission: RE | Admit: 2014-07-05 | Discharge: 2014-07-05 | Disposition: A | Discharge status: Home / Self Care | Payer: No Typology Code available for payment source | Source: Ambulatory Visit | Attending: *Deleted | Admitting: *Deleted

## 2014-07-05 ENCOUNTER — Other Ambulatory Visit: Payer: Self-pay

## 2014-07-05 ENCOUNTER — Other Ambulatory Visit: Payer: Self-pay | Admitting: *Deleted

## 2014-07-05 DIAGNOSIS — M549 Dorsalgia, unspecified: Secondary | ICD-10-CM

## 2014-11-05 ENCOUNTER — Emergency Department (HOSPITAL_BASED_OUTPATIENT_CLINIC_OR_DEPARTMENT_OTHER)
Admission: EM | Admit: 2014-11-05 | Discharge: 2014-11-05 | Disposition: A | Discharge status: Home / Self Care | Payer: No Typology Code available for payment source | Attending: Emergency Medicine | Admitting: Emergency Medicine

## 2014-11-05 ENCOUNTER — Encounter (HOSPITAL_BASED_OUTPATIENT_CLINIC_OR_DEPARTMENT_OTHER): Payer: Self-pay | Admitting: *Deleted

## 2014-11-05 DIAGNOSIS — I10 Essential (primary) hypertension: Secondary | ICD-10-CM | POA: Insufficient documentation

## 2014-11-05 DIAGNOSIS — E119 Type 2 diabetes mellitus without complications: Secondary | ICD-10-CM | POA: Insufficient documentation

## 2014-11-05 DIAGNOSIS — Z90710 Acquired absence of both cervix and uterus: Secondary | ICD-10-CM | POA: Insufficient documentation

## 2014-11-05 DIAGNOSIS — R1084 Generalized abdominal pain: Secondary | ICD-10-CM | POA: Insufficient documentation

## 2014-11-05 DIAGNOSIS — Z79899 Other long term (current) drug therapy: Secondary | ICD-10-CM | POA: Insufficient documentation

## 2014-11-05 LAB — COMPREHENSIVE METABOLIC PANEL
ALBUMIN: 4.2 g/dL (ref 3.5–5.2)
ALK PHOS: 98 U/L (ref 39–117)
ALT: 25 U/L (ref 0–35)
AST: 30 U/L (ref 0–37)
Anion gap: 9 (ref 5–15)
BILIRUBIN TOTAL: 0.5 mg/dL (ref 0.3–1.2)
BUN: 11 mg/dL (ref 6–23)
CHLORIDE: 101 mmol/L (ref 96–112)
CO2: 31 mmol/L (ref 19–32)
Calcium: 9.7 mg/dL (ref 8.4–10.5)
Creatinine, Ser: 0.55 mg/dL (ref 0.50–1.10)
GFR calc non Af Amer: >90 mL/min (ref >90)
GLUCOSE: 116 mg/dL — AB (ref 70–99)
Potassium: 2.9 mmol/L — ABNORMAL LOW (ref 3.5–5.1)
SODIUM: 141 mmol/L (ref 135–145)
TOTAL PROTEIN: 8.6 g/dL — AB (ref 6.0–8.3)

## 2014-11-05 LAB — URINALYSIS, ROUTINE W REFLEX MICROSCOPIC
BILIRUBIN URINE: NEGATIVE
GLUCOSE, UA: NEGATIVE mg/dL
Ketones, ur: NEGATIVE mg/dL
Leukocytes, UA: NEGATIVE
Nitrite: NEGATIVE
Protein, ur: NEGATIVE mg/dL
SPECIFIC GRAVITY, URINE: 1.013 (ref 1.005–1.030)
Urobilinogen, UA: 1 mg/dL (ref 0.0–1.0)
pH: 6 (ref 5.0–8.0)

## 2014-11-05 LAB — CBC WITH DIFFERENTIAL/PLATELET
Basophils Absolute: 0 10*3/uL (ref 0.0–0.1)
Basophils Relative: 0 % (ref 0–1)
EOS ABS: 0.2 10*3/uL (ref 0.0–0.7)
Eosinophils Relative: 2 % (ref 0–5)
HCT: 39.8 % (ref 36.0–46.0)
HEMOGLOBIN: 13.2 g/dL (ref 12.0–15.0)
LYMPHS ABS: 4.4 10*3/uL — AB (ref 0.7–4.0)
LYMPHS PCT: 44 % (ref 12–46)
MCH: 28.1 pg (ref 26.0–34.0)
MCHC: 33.2 g/dL (ref 30.0–36.0)
MCV: 84.9 fL (ref 78.0–100.0)
Monocytes Absolute: 0.4 10*3/uL (ref 0.1–1.0)
Monocytes Relative: 4 % (ref 3–12)
NEUTROS ABS: 5.1 10*3/uL (ref 1.7–7.7)
NEUTROS PCT: 50 % (ref 43–77)
Platelets: 256 10*3/uL (ref 150–400)
RBC: 4.69 MIL/uL (ref 3.87–5.11)
RDW: 14.5 % (ref 11.5–15.5)
WBC: 10.1 10*3/uL (ref 4.0–10.5)

## 2014-11-05 LAB — LIPASE, BLOOD: Lipase: 29 U/L (ref 11–59)

## 2014-11-05 LAB — URINE MICROSCOPIC-ADD ON

## 2014-11-05 MED ORDER — POTASSIUM CHLORIDE CRYS ER 20 MEQ PO TBCR
40.0000 meq | EXTENDED_RELEASE_TABLET | Freq: Once | ORAL | Status: AC
  Administered 2014-11-05: 40 meq via ORAL
  Filled 2014-11-05: qty 2

## 2014-11-05 MED ORDER — OMEPRAZOLE 20 MG PO CPDR: 20.0000 mg | DELAYED_RELEASE_CAPSULE | Freq: Every day | ORAL | Status: DC

## 2014-11-05 MED ORDER — GI COCKTAIL ~~LOC~~
30.0000 mL | Freq: Once | ORAL | Status: AC
  Administered 2014-11-05: 30 mL via ORAL
  Filled 2014-11-05: qty 30

## 2014-11-05 NOTE — ED Notes (Signed)
Abdominal pain for 2 weeks. Feels like something is moving in her abdomen especially at night.

## 2014-11-05 NOTE — ED Provider Notes (Signed)
CSN: [id number]     Arrival date & time 11/05/14  1248 History   First MD Initiated Contact with Patient 11/05/14 1504     Chief Complaint  Patient presents with  . Abdominal Pain     HPI  Patient presents with ongoing abdominal pain. Pain is present for a long time, the past weeks has been more frequently present. The pain is diffuse, with a sensation of movement inside her abdomen, pressure. There are no clear precipitating, relieving, exacerbating factors. No nausea, vomiting, but diarrhea or changes in bowel movements. No fever, chills, chest pain, dyspnea. Patient was seen last year, has not followed up with outpatient providers since that visit. History is provided by the patient with assistance of a translator.  Patient speaks Arabic.  Past Medical History  Diagnosis Date  . Hypertension   . Diabetes mellitus    Past Surgical History  Procedure Laterality Date  . Abdominal hysterectomy     No family history on file. History  Substance Use Topics  . Smoking status: Never Smoker   . Smokeless tobacco: Not on file  . Alcohol Use: No   OB History    No data available     Review of Systems  Constitutional:       Per HPI, otherwise negative  HENT:       Per HPI, otherwise negative  Respiratory:       Per HPI, otherwise negative  Cardiovascular:       Per HPI, otherwise negative  Gastrointestinal: Negative for vomiting.  Endocrine:       Negative aside from HPI  Genitourinary:       Neg aside from HPI   Musculoskeletal:       Per HPI, otherwise negative  Skin: Negative.   Neurological: Negative for syncope.      Allergies  Review of patient's allergies indicates no known allergies.  Home Medications   Prior to Admission medications   Medication Sig Start Date End Date Taking? Authorizing Provider  atenolol-chlorthalidone (TENORETIC) 50-25 MG per tablet Take 1 tablet by mouth daily.    Historical Provider, MD  HYDROcodone-acetaminophen  (NORCO/VICODIN) 5-325 MG per tablet Take 2 tablets by mouth every 4 (four) hours as needed for pain. 07/30/12   Fransico Meadow, PA-C  ibuprofen (ADVIL,MOTRIN) 800 MG tablet Take 1 tablet (800 mg total) by mouth 3 (three) times daily. 07/30/12   Fransico Meadow, PA-C  metFORMIN (GLUCOPHAGE) 500 MG tablet Take 500 mg by mouth 2 (two) times daily with a meal.    Historical Provider, MD  metFORMIN (GLUCOPHAGE) 500 MG tablet Take 1 tablet (500 mg total) by mouth 2 (two) times daily with a meal. 10/04/12 10/04/13  Jasper Riling. Pickering, MD  potassium chloride SA (K-DUR,KLOR-CON) 20 MEQ tablet Take 1 tablet (20 mEq total) by mouth 2 (two) times daily. 05/17/14   Shaune Pollack, MD   BP 147/86 mmHg  Pulse 75  Temp(Src) 98.4 F (36.9 C) (Oral)  Resp 18  Ht 5\' 4"  (1.626 m)  Wt 220 lb 12.8 oz (100.154 kg)  BMI 37.88 kg/m2  SpO2 99% Physical Exam  Constitutional: She is oriented to person, place, and time. She appears well-developed and well-nourished. No distress.  Patient resting in gurney, no distress, smiling, interacting appropriately  HENT:  Head: Normocephalic and atraumatic.  Eyes: Conjunctivae and EOM are normal.  Cardiovascular: Normal rate and regular rhythm.   Pulmonary/Chest: Effort normal and breath sounds normal. No stridor. No respiratory distress.  Abdominal: She exhibits no distension. There is no tenderness. There is no rigidity and no guarding.  Patient denies current pain  Musculoskeletal: She exhibits no edema.  Neurological: She is alert and oriented to person, place, and time. No cranial nerve deficit.  Skin: Skin is warm and dry.  Psychiatric: She has a normal mood and affect.  Nursing note and vitals reviewed.   ED Course  Procedures (including critical care time) Labs Review Labs Reviewed  URINALYSIS, ROUTINE W REFLEX MICROSCOPIC - Abnormal; Notable for the following:    Hgb urine dipstick SMALL (*)    All other components within normal limits  URINE MICROSCOPIC-ADD ON  - Abnormal; Notable for the following:    Bacteria, UA FEW (*)    All other components within normal limits  COMPREHENSIVE METABOLIC PANEL - Abnormal; Notable for the following:    Potassium 2.9 (*)    Glucose, Bld 116 (*)    Total Protein 8.6 (*)    All other components within normal limits  CBC WITH DIFFERENTIAL/PLATELET - Abnormal; Notable for the following:    Lymphs Abs 4.4 (*)    All other components within normal limits  LIPASE, BLOOD       I reviewed the patient's chart, including visit to the emergency department August 2 015.  Patient was diagnosed with hypokalemia, distal esophageal changes, told to follow-up with GI.  Patient has not followed up with gastrointestinal physician  On repeat exam the patient is in no distress.  I discussed all findings with her. Patient will start PPI therapy, was encouraged to follow-up with GI.   MDM   Patient presents with ongoing abdominal pain, though none in the emergency department. Patient's labs here are largely reassuring though there is mild hypokalemia. Patient received repletion for this. Review of her chart demonstrates prior evidence for esophageal irritation, which the patient has not had evaluated by a gastrin relatives.  Nor has she started PPI or H2 blockers. Patient has no evidence for acute processes currently, no peritoneal findings, no evidence for bacteremia.  Patient was started on PPI, provided follow-up information, discharged in stable condition.     Carmin Muskrat, MD 11/05/14 [phone]

## 2014-11-05 NOTE — Discharge Instructions (Signed)
As discussed, your evaluation today has been largely reassuring.  But, it is important that you monitor your condition carefully, and do not hesitate to return to the ED if you develop new, or concerning changes in your condition.  Please be sure to take all medication as directed, follow up with both a gastroenterologist and your primary care physician within the week for further evaluation and management.   Abdominal Pain Many things can cause abdominal pain. Usually, abdominal pain is not caused by a disease and will improve without treatment. It can often be observed and treated at home. Your health care provider will do a physical exam and possibly order blood tests and X-rays to help determine the seriousness of your pain. However, in many cases, more time must pass before a clear cause of the pain can be found. Before that point, your health care provider may not know if you need more testing or further treatment. HOME CARE INSTRUCTIONS  Monitor your abdominal pain for any changes. The following actions may help to alleviate any discomfort you are experiencing:  Only take over-the-counter or prescription medicines as directed by your health care provider.  Do not take laxatives unless directed to do so by your health care provider.  Try a clear liquid diet (broth, tea, or water) as directed by your health care provider. Slowly move to a bland diet as tolerated. SEEK MEDICAL CARE IF:  You have unexplained abdominal pain.  You have abdominal pain associated with nausea or diarrhea.  You have pain when you urinate or have a bowel movement.  You experience abdominal pain that wakes you in the night.  You have abdominal pain that is worsened or improved by eating food.  You have abdominal pain that is worsened with eating fatty foods.  You have a fever. SEEK IMMEDIATE MEDICAL CARE IF:   Your pain does not go away within 2 hours.  You keep throwing up (vomiting).  Your pain is  felt only in portions of the abdomen, such as the right side or the left lower portion of the abdomen.  You pass bloody or black tarry stools. MAKE SURE YOU:  Understand these instructions.   Will watch your condition.   Will get help right away if you are not doing well or get worse.  Document Released: 06/16/2005 Document Revised: 09/11/2013 Document Reviewed: 05/16/2013 [facility] Patient Information 2015 [place], [place]. This information is not intended to replace advice given to you by your health care provider. Make sure you discuss any questions you have with your health care provider.

## 2015-11-03 ENCOUNTER — Encounter (HOSPITAL_COMMUNITY): Payer: Self-pay | Admitting: *Deleted

## 2015-11-05 NOTE — Progress Notes (Signed)
Confirmed email  Received -that Heidi Davis to interpret for pt 11-06-15 procedure.

## 2015-11-06 ENCOUNTER — Encounter (HOSPITAL_COMMUNITY): Payer: Self-pay

## 2015-11-06 ENCOUNTER — Ambulatory Visit (HOSPITAL_COMMUNITY)
Admission: RE | Admit: 2015-11-06 | Discharge: 2015-11-06 | Disposition: A | Discharge status: Home / Self Care | Payer: Self-pay | Source: Ambulatory Visit | Attending: Gastroenterology | Admitting: Gastroenterology

## 2015-11-06 ENCOUNTER — Ambulatory Visit (HOSPITAL_COMMUNITY): Payer: Self-pay | Admitting: Anesthesiology

## 2015-11-06 ENCOUNTER — Encounter (HOSPITAL_COMMUNITY)
Admission: RE | Disposition: A | Discharge status: Home / Self Care | Payer: Self-pay | Source: Ambulatory Visit | Attending: Gastroenterology

## 2015-11-06 ENCOUNTER — Other Ambulatory Visit: Payer: Self-pay | Admitting: Gastroenterology

## 2015-11-06 DIAGNOSIS — Z1211 Encounter for screening for malignant neoplasm of colon: Secondary | ICD-10-CM | POA: Insufficient documentation

## 2015-11-06 DIAGNOSIS — K219 Gastro-esophageal reflux disease without esophagitis: Secondary | ICD-10-CM | POA: Insufficient documentation

## 2015-11-06 DIAGNOSIS — Z7984 Long term (current) use of oral hypoglycemic drugs: Secondary | ICD-10-CM | POA: Insufficient documentation

## 2015-11-06 DIAGNOSIS — I10 Essential (primary) hypertension: Secondary | ICD-10-CM | POA: Insufficient documentation

## 2015-11-06 DIAGNOSIS — E119 Type 2 diabetes mellitus without complications: Secondary | ICD-10-CM | POA: Insufficient documentation

## 2015-11-06 DIAGNOSIS — Z79899 Other long term (current) drug therapy: Secondary | ICD-10-CM | POA: Insufficient documentation

## 2015-11-06 DIAGNOSIS — Z79891 Long term (current) use of opiate analgesic: Secondary | ICD-10-CM | POA: Insufficient documentation

## 2015-11-06 DIAGNOSIS — Z6837 Body mass index (BMI) 37.0-37.9, adult: Secondary | ICD-10-CM | POA: Insufficient documentation

## 2015-11-06 DIAGNOSIS — J45909 Unspecified asthma, uncomplicated: Secondary | ICD-10-CM | POA: Insufficient documentation

## 2015-11-06 DIAGNOSIS — K581 Irritable bowel syndrome with constipation: Secondary | ICD-10-CM | POA: Insufficient documentation

## 2015-11-06 DIAGNOSIS — E669 Obesity, unspecified: Secondary | ICD-10-CM | POA: Insufficient documentation

## 2015-11-06 HISTORY — DX: Gastro-esophageal reflux disease without esophagitis: K21.9

## 2015-11-06 HISTORY — PX: COLONOSCOPY WITH PROPOFOL: SHX5780

## 2015-11-06 HISTORY — DX: Unspecified asthma, uncomplicated: J45.909

## 2015-11-06 LAB — GLUCOSE, CAPILLARY: Glucose-Capillary: 109 mg/dL — ABNORMAL HIGH (ref 65–99)

## 2015-11-06 SURGERY — COLONOSCOPY WITH PROPOFOL
Anesthesia: Monitor Anesthesia Care

## 2015-11-06 MED ORDER — PROPOFOL 10 MG/ML IV BOLUS
INTRAVENOUS | Status: AC
Start: 2015-11-06 — End: 2015-11-06
  Filled 2015-11-06: qty 40

## 2015-11-06 MED ORDER — SODIUM CHLORIDE 0.9 % IV SOLN: INTRAVENOUS | Status: DC

## 2015-11-06 MED ORDER — PROPOFOL 10 MG/ML IV BOLUS
INTRAVENOUS | Status: DC | PRN
  Administered 2015-11-06 (×2): 20 mg via INTRAVENOUS

## 2015-11-06 MED ORDER — PROPOFOL 500 MG/50ML IV EMUL
INTRAVENOUS | Status: DC | PRN
  Administered 2015-11-06: 140 ug/kg/min via INTRAVENOUS

## 2015-11-06 MED ORDER — LACTATED RINGERS IV SOLN
INTRAVENOUS | Status: DC
Start: 2015-11-06 — End: 2015-11-06
  Administered 2015-11-06: 1000 mL via INTRAVENOUS

## 2015-11-06 SURGICAL SUPPLY — 21 items

## 2015-11-06 NOTE — Anesthesia Preprocedure Evaluation (Addendum)
Anesthesia Evaluation  Patient identified by MRN, date of birth, ID band Patient awake    Reviewed: Allergy & Precautions, NPO status , Patient's Chart, lab work & pertinent test results  Airway Mallampati: II  TM Distance: >3 FB Neck ROM: Full    Dental no notable dental hx.    Pulmonary neg pulmonary ROS,    Pulmonary exam normal breath sounds clear to auscultation       Cardiovascular hypertension, Pt. on medications and Pt. on home beta blockers Normal cardiovascular exam Rhythm:Regular Rate:Normal     Neuro/Psych negative neurological ROS  negative psych ROS   GI/Hepatic Neg liver ROS, GERD  ,  Endo/Other  diabetes, Type 2, Oral Hypoglycemic Agents  Renal/GU negative Renal ROS  negative genitourinary   Musculoskeletal negative musculoskeletal ROS (+)   Abdominal (+) + obese,   Peds negative pediatric ROS (+)  Hematology negative hematology ROS (+)   Anesthesia Other Findings   Reproductive/Obstetrics negative OB ROS                             Anesthesia Physical Anesthesia Plan  ASA: II  Anesthesia Plan: MAC   Post-op Pain Management:    Induction: Intravenous  Airway Management Planned: Natural Airway  Additional Equipment:   Intra-op Plan:   Post-operative Plan:   Informed Consent: I have reviewed the patients History and Physical, chart, labs and discussed the procedure including the risks, benefits and alternatives for the proposed anesthesia with the patient or authorized representative who has indicated his/her understanding and acceptance.   Dental advisory given  Plan Discussed with: CRNA  Anesthesia Plan Comments:         Anesthesia Quick Evaluation

## 2015-11-06 NOTE — Op Note (Signed)
[facility] [place], [address]   COLONOSCOPY PROCEDURE REPORT  PATIENT: Heidi Davis, Heidi Davis  MR#: [id number] BIRTHDATE: [birth year] , 67  yrs. old GENDER: female ENDOSCOPIST: Ronald Lobo, MD REFERRED BY:  [facility] PROCEDURE DATE:  Nov 20, 2015 PROCEDURE:   colonoscopy ASA CLASS:   II INDICATIONS:initial screening exam in this 55 year old black immigrant (speaks Arabic) who also has constipation predominant IBS MEDICATIONS: propofol, with Mac per anesthesia  DESCRIPTION OF PROCEDURE:   After the risks and benefits and of the procedure were explained, informed consent was obtained. The patient came as an outpatient to the [facility] long endoscopy unit. Digital exam was unremarkable.         The [id number] adult colonoscope was introduced through the anus and advanced to the cecum, as confirmed by visualization of the ileal cecal valve, the absence of further lumen, and a typical "crow's foot" confluence of folds .Advancement was not difficult except getting the tip of the scope to enter the base of the cecum, which required placing the patient into the supine position and applying external abdominal pressure.  The quality of the prep was very good      . The instrument was then slowly withdrawn as the colon was fully examined. Estimated blood loss is zero unless otherwise noted in this procedure report.   This was a normal exam.  Specifically, there was no evidence of polyps, cancer, colitis, vascular malformations, or diverticulosis.  Retroflexion in the rectum and reinspection of the rectum were unremarkable. No biopsies were obtained.  the patient tolerated the procedure well.          The scope was then withdrawn from the patient and the procedure completed.  WITHDRAWAL TIME: 13 minutes  COMPLICATIONS: There were no immediate complications.  ENDOSCOPIC IMPRESSION: normal initial screening  colonoscopy.  RECOMMENDATIONS: repeat colonoscopy for screening in 10 years Continue current management for IBS symptoms  REPEAT EXAM: 10 years  cc:  _______________________________ eSignedRonald Lobo, MD 11/20/15 11:49 AM   CPT CODES: ICD CODES:  The ICD and CPT codes recommended by this software are interpretations from the data that the clinical staff has captured with the software.  The verification of the translation of this report to the ICD and CPT codes and modifiers is the sole responsibility of the health care institution and practicing physician where this report was generated.  [employer] will not be held responsible for the validity of the ICD and CPT codes included on this report.  AMA assumes no liability for data contained or not contained herein. CPT is a Designer, television/film set of the [employer].

## 2015-11-06 NOTE — Anesthesia Postprocedure Evaluation (Signed)
Anesthesia Post Note  Patient: Heidi Davis  Procedure(s) Performed: Procedure(s) (LRB): COLONOSCOPY WITH PROPOFOL (N/A)  Patient location during evaluation: PACU Anesthesia Type: MAC Level of consciousness: awake and alert Pain management: pain level controlled Vital Signs Assessment: post-procedure vital signs reviewed and stable Respiratory status: spontaneous breathing, nonlabored ventilation, respiratory function stable and patient connected to nasal cannula oxygen Cardiovascular status: stable and blood pressure returned to baseline Anesthetic complications: no    Last Vitals:  Filed Vitals:   11/06/15 1150 11/06/15 1210  BP: 112/76 124/86  Pulse:    Temp:    Resp: 26 20    Last Pain: There were no vitals filed for this visit.               Katherinne Mofield J

## 2015-11-06 NOTE — Discharge Instructions (Addendum)
Resume previous medicines.  Call us if pain or fever following procedure.   Colonoscopy, Care After These instructions give you information on caring for yourself after your procedure. Your doctor may also give you more specific instructions. Call your doctor if you have any problems or questions after your procedure. HOME CARE  Do not drive for 24 hours.  Do not sign important papers or use machinery for 24 hours.  You may shower.  You may go back to your usual activities, but go slower for the first 24 hours.  Take rest breaks often during the first 24 hours.  Walk around or use warm packs on your belly (abdomen) if you have belly cramping or gas.  Drink enough fluids to keep your pee (urine) clear or pale yellow.  Resume your normal diet. Avoid heavy or fried foods.  Avoid drinking alcohol for 24 hours or as told by your doctor.  Only take medicines as told by your doctor. If a tissue sample (biopsy) was taken during the procedure:   Do not take aspirin or blood thinners for 7 days, or as told by your doctor.  Do not drink alcohol for 7 days, or as told by your doctor.  Eat soft foods for the first 24 hours. GET HELP IF: You still have a small amount of blood in your poop (stool) 2-3 days after the procedure. GET HELP RIGHT AWAY IF:  You have more than a small amount of blood in your poop.  You see clumps of tissue (blood clots) in your poop.  Your belly is puffy (swollen).  You feel sick to your stomach (nauseous) or throw up (vomit).  You have a fever.  You have belly pain that gets worse and medicine does not help. MAKE SURE YOU:  Understand these instructions.  Will watch your condition  Will get help right away if you are not doing well or get worse.   This information is not intended to replace advice given to you by your health care provider. Make sure you discuss any questions you have with your health care provider.   Document Released:  10/09/2010 Document Revised: 09/11/2013 Document Reviewed: 05/14/2013 Elsevier Interactive Patient Education 2016 [place] Monitored anesthesia care is an anesthesia service for a medical procedure. Anesthesia is the loss of the ability to feel pain. It is produced by medicines called anesthetics. It may affect a small area of your body (local anesthesia), a large area of your body (regional anesthesia), or your entire body (general anesthesia). The need for monitored anesthesia care depends your procedure, your condition, and the potential need for regional or general anesthesia. It is often provided during procedures where:   General anesthesia may be needed if there are complications. This is because you need special care when you are under general anesthesia.   You will be under local or regional anesthesia. This is so that you are able to have higher levels of anesthesia if needed.   You will receive calming medicines (sedatives). This is especially the case if sedatives are given to put you in a semi-conscious state of relaxation (deep sedation). This is because the amount of sedative needed to produce this state can be hard to predict. Too much of a sedative can produce general anesthesia. Monitored anesthesia care is performed by one or more health care providers who have special training in all types of anesthesia. You will need to meet with these health care providers before your  procedure. During this meeting, they will ask you about your medical history. They will also give you instructions to follow. (For example, you will need to stop eating and drinking before your procedure. You may also need to stop or change medicines you are taking.) During your procedure, your health care providers will stay with you. They will:   Watch your condition. This includes watching your blood pressure, breathing, and level of pain.   Diagnose and treat problems that  occur.   Give medicines if they are needed. These may include calming medicines (sedatives) and anesthetics.   Make sure you are comfortable.  Having monitored anesthesia care does not necessarily mean that you will be under anesthesia. It does mean that your health care providers will be able to manage anesthesia if you need it or if it occurs. It also means that you will be able to have a different type of anesthesia than you are having if you need it. When your procedure is complete, your health care providers will continue to watch your condition. They will make sure any medicines wear off before you are allowed to go home.    This information is not intended to replace advice given to you by your health care provider. Make sure you discuss any questions you have with your health care provider.   Document Released: 06/02/2005 Document Revised: 09/27/2014 Document Reviewed: 10/18/2012 Elsevier Interactive Patient Education [employer].

## 2015-11-06 NOTE — Transfer of Care (Signed)
Immediate Anesthesia Transfer of Care Note  Patient: Heidi Davis  Procedure(s) Performed: Procedure(s): COLONOSCOPY WITH PROPOFOL (N/A)  Patient Location: Endoscopy Unit  Anesthesia Type:MAC  Level of Consciousness: awake and alert   Airway & Oxygen Therapy: Patient Spontanous Breathing and Patient connected to face mask oxygen  Post-op Assessment: Report given to RN and Post -op Vital signs reviewed and stable  Post vital signs: Reviewed and stable  Last Vitals:  Filed Vitals:   11/06/15 1027  BP: 150/99  Pulse: 62  Temp: 37.3 C  Resp: 20    Complications: No apparent anesthesia complications

## 2015-11-06 NOTE — H&P (Signed)
Heidi Davis is an 55 y.o. female.   Chief Complaint: Need for colon cancer screening HPI: This patient, who only speaks Arabic, has a history of constipation predominant IBS with some lower abdominal discomfort and irregularity of bowel habit. She has had a negative CT with respect to any lower abdominal processes. She has never had screening colonoscopy.  Past Medical History  Diagnosis Date  . Hypertension   . Diabetes mellitus   . Asthma     long times ago- 12 yrs ago. no meds  . GERD (gastroesophageal reflux disease)     Past Surgical History  Procedure Laterality Date  . Abdominal hysterectomy      History reviewed. No pertinent family history. Social History:  reports that she has never smoked. She does not have any smokeless tobacco history on file. She reports that she does not drink alcohol or use illicit drugs.  Allergies: No Known Allergies  Medications Prior to Admission  Medication Sig Dispense Refill  . atenolol-chlorthalidone (TENORETIC) 50-25 MG per tablet Take 1 tablet by mouth daily.    Marland Kitchen HYDROcodone-acetaminophen (NORCO/VICODIN) 5-325 MG per tablet Take 2 tablets by mouth every 4 (four) hours as needed for pain. 10 tablet 0  . metFORMIN (GLUCOPHAGE) 500 MG tablet Take 500 mg by mouth 2 (two) times daily with a meal.    . omeprazole (PRILOSEC) 20 MG capsule Take 1 capsule (20 mg total) by mouth daily. 30 capsule 0  . potassium chloride SA (K-DUR,KLOR-CON) 20 MEQ tablet Take 1 tablet (20 mEq total) by mouth 2 (two) times daily. 10 tablet 0  . metFORMIN (GLUCOPHAGE) 500 MG tablet Take 1 tablet (500 mg total) by mouth 2 (two) times daily with a meal. 60 tablet 0    No results found for this or any previous visit (from the past 48 hour(s)). No results found.  ROS negative for asthma, COPD sleep apnea, or history of MI  Blood pressure 150/99, pulse 62, temperature 99.1 F (37.3 C), temperature source Oral, resp. rate 20, height 5\' 4"  (1.626 m), weight 99.791  kg (220 lb), SpO2 97 %. Physical Exam  Pleasant, comfortable appearing, fairly severely overweight black female. Chest clear. Heart without murmur or arrhythmia. Abdomen obese, but nontender. Oropharynx benign.   Assessment/Plan 1. Need for colon cancer screening. Patient at "standard risk", with no family history of colon cancer and no worrisome symptoms. 2. Constipation predominant IBS  Will proceed to colonoscopic evaluation, the nature, purpose, and risks of which had been reviewed with the patient and her daughter as an outpatient.  [name], MD 11/06/2015, 10:51 AM

## 2015-11-07 ENCOUNTER — Encounter (HOSPITAL_COMMUNITY): Payer: Self-pay | Admitting: Gastroenterology

## 2016-01-04 ENCOUNTER — Emergency Department (HOSPITAL_BASED_OUTPATIENT_CLINIC_OR_DEPARTMENT_OTHER): Payer: No Typology Code available for payment source

## 2016-01-04 ENCOUNTER — Encounter (HOSPITAL_BASED_OUTPATIENT_CLINIC_OR_DEPARTMENT_OTHER): Payer: Self-pay | Admitting: Emergency Medicine

## 2016-01-04 ENCOUNTER — Emergency Department (HOSPITAL_BASED_OUTPATIENT_CLINIC_OR_DEPARTMENT_OTHER)
Admission: EM | Admit: 2016-01-04 | Discharge: 2016-01-04 | Disposition: A | Discharge status: Home / Self Care | Payer: No Typology Code available for payment source | Attending: Emergency Medicine | Admitting: Emergency Medicine

## 2016-01-04 DIAGNOSIS — Z7984 Long term (current) use of oral hypoglycemic drugs: Secondary | ICD-10-CM | POA: Insufficient documentation

## 2016-01-04 DIAGNOSIS — J45909 Unspecified asthma, uncomplicated: Secondary | ICD-10-CM | POA: Insufficient documentation

## 2016-01-04 DIAGNOSIS — K219 Gastro-esophageal reflux disease without esophagitis: Secondary | ICD-10-CM | POA: Insufficient documentation

## 2016-01-04 DIAGNOSIS — E119 Type 2 diabetes mellitus without complications: Secondary | ICD-10-CM | POA: Insufficient documentation

## 2016-01-04 DIAGNOSIS — J069 Acute upper respiratory infection, unspecified: Secondary | ICD-10-CM

## 2016-01-04 DIAGNOSIS — Z79899 Other long term (current) drug therapy: Secondary | ICD-10-CM | POA: Insufficient documentation

## 2016-01-04 DIAGNOSIS — I10 Essential (primary) hypertension: Secondary | ICD-10-CM | POA: Insufficient documentation

## 2016-01-04 MED ORDER — AZITHROMYCIN 250 MG PO TABS: 250.0000 mg | ORAL_TABLET | Freq: Every day | ORAL | Status: DC

## 2016-01-04 MED ORDER — HYDROCODONE-HOMATROPINE 5-1.5 MG/5ML PO SYRP: 5.0000 mL | ORAL_SOLUTION | Freq: Four times a day (QID) | ORAL | Status: DC | PRN

## 2016-01-04 NOTE — ED Notes (Signed)
Interpreter phone line declined by pt and pt's son. Pt received discharge instructions and responded appropriately during discharge teaching. Son at bedside to clarify instructions if necessary.

## 2016-01-04 NOTE — Discharge Instructions (Signed)
Upper Respiratory Infection, Adult Most upper respiratory infections (URIs) are a viral infection of the air passages leading to the lungs. A URI affects the nose, throat, and upper air passages. The most common type of URI is nasopharyngitis and is typically referred to as "the common cold." URIs run their course and usually go away on their own. Most of the time, a URI does not require medical attention, but sometimes a bacterial infection in the upper airways can follow a viral infection. This is called a secondary infection. Sinus and middle ear infections are common types of secondary upper respiratory infections. Bacterial pneumonia can also complicate a URI. A URI can worsen asthma and chronic obstructive pulmonary disease (COPD). Sometimes, these complications can require emergency medical care and may be life threatening.  CAUSES Almost all URIs are caused by viruses. A virus is a type of germ and can spread from one person to another.  RISKS FACTORS You may be at risk for a URI if:   You smoke.   You have chronic heart or lung disease.  You have a weakened defense (immune) system.   You are very young or very old.   You have nasal allergies or asthma.  You work in crowded or poorly ventilated areas.  You work in health care facilities or schools. SIGNS AND SYMPTOMS  Symptoms typically develop 2-3 days after you come in contact with a cold virus. Most viral URIs last 7-10 days. However, viral URIs from the influenza virus (flu virus) can last 14-18 days and are typically more severe. Symptoms may include:   Runny or stuffy (congested) nose.   Sneezing.   Cough.   Sore throat.   Headache.   Fatigue.   Fever.   Loss of appetite.   Pain in your forehead, behind your eyes, and over your cheekbones (sinus pain).  Muscle aches.  DIAGNOSIS  Your health care provider may diagnose a URI by:  Physical exam.  Tests to check that your symptoms are not due to  another condition such as:  Strep throat.  Sinusitis.  Pneumonia.  Asthma. TREATMENT  A URI goes away on its own with time. It cannot be cured with medicines, but medicines may be prescribed or recommended to relieve symptoms. Medicines may help:  Reduce your fever.  Reduce your cough.  Relieve nasal congestion. HOME CARE INSTRUCTIONS   Take medicines only as directed by your health care provider.   Gargle warm saltwater or take cough drops to comfort your throat as directed by your health care provider.  Use a warm mist humidifier or inhale steam from a shower to increase air moisture. This may make it easier to breathe.  Drink enough fluid to keep your urine clear or pale yellow.   Eat soups and other clear broths and maintain good nutrition.   Rest as needed.   Return to work when your temperature has returned to normal or as your health care provider advises. You may need to stay home longer to avoid infecting others. You can also use a face mask and careful hand washing to prevent spread of the virus.  Increase the usage of your inhaler if you have asthma.   Do not use any tobacco products, including cigarettes, chewing tobacco, or electronic cigarettes. If you need help quitting, ask your health care provider. PREVENTION  The best way to protect yourself from getting a cold is to practice good hygiene.   Avoid oral or hand contact with people with cold   symptoms.   Wash your hands often if contact occurs.  There is no clear evidence that vitamin C, vitamin E, echinacea, or exercise reduces the chance of developing a cold. However, it is always recommended to get plenty of rest, exercise, and practice good nutrition.  SEEK MEDICAL CARE IF:   You are getting worse rather than better.   Your symptoms are not controlled by medicine.   You have chills.  You have worsening shortness of breath.  You have brown or red mucus.  You have yellow or brown nasal  discharge.  You have pain in your face, especially when you bend forward.  You have a fever.  You have swollen neck glands.  You have pain while swallowing.  You have white areas in the back of your throat. SEEK IMMEDIATE MEDICAL CARE IF:   You have severe or persistent:  Headache.  Ear pain.  Sinus pain.  Chest pain.  You have chronic lung disease and any of the following:  Wheezing.  Prolonged cough.  Coughing up blood.  A change in your usual mucus.  You have a stiff neck.  You have changes in your:  Vision.  Hearing.  Thinking.  Mood. MAKE SURE YOU:   Understand these instructions.  Will watch your condition.  Will get help right away if you are not doing well or get worse.   This information is not intended to replace advice given to you by your health care provider. Make sure you discuss any questions you have with your health care provider.   Document Released: 03/02/2001 Document Revised: 01/21/2015 Document Reviewed: 12/12/2013 Elsevier Interactive Patient Education 2016 [employer]  

## 2016-01-04 NOTE — ED Provider Notes (Signed)
CSN: [id number]     Arrival date & time 01/04/16  1405 History   By signing my name below, I, Nicole Kindred, attest that this documentation has been prepared under the direction and in the presence of Blanchie Dessert, MD.   Electronically Signed: Nicole Kindred, ED Scribe. 01/04/2016. 3:02 PM  Chief Complaint  Patient presents with  . Cough  . Fever    The history is provided by the patient and the spouse. No language interpreter was used.   HPI Comments: Heidi Davis is a 55 y.o. female who presents to the Emergency Department complaining of gradual onset, nonproductive cough, ongoing for four days. Pt's relative reports associated fever, and chills. No sick contact noted. No other associated symptoms noted. Pt took mucinex PTA with minimal relief to symptoms. No other worsening or alleviating factors noted. Relative denies shortness of breath, recent travel, vomiting, diarrhea, sore throat, ear pain, or any other pertinent symptoms. Her PCP is Kevan Ny.   Past Medical History  Diagnosis Date  . Hypertension   . Diabetes mellitus   . Asthma     long times ago- 12 yrs ago. no meds  . GERD (gastroesophageal reflux disease)    Past Surgical History  Procedure Laterality Date  . Abdominal hysterectomy    . Colonoscopy with propofol N/A 11/06/2015    Procedure: COLONOSCOPY WITH PROPOFOL;  Surgeon: Ronald Lobo, MD;  Location: [facility];  Service: Endoscopy;  Laterality: N/A;   History reviewed. No pertinent family history. Social History  Substance Use Topics  . Smoking status: Never Smoker   . Smokeless tobacco: None  . Alcohol Use: No   OB History    No data available     Review of Systems A complete 10 system review of systems was obtained and all systems are negative except as noted in the HPI and PMH.   Allergies  Review of patient's allergies indicates no known allergies.  Home Medications   Prior to Admission medications   Medication Sig Start  Date End Date Taking? Authorizing Provider  atenolol-chlorthalidone (TENORETIC) 50-25 MG per tablet Take 1 tablet by mouth daily.    Historical Provider, MD  HYDROcodone-acetaminophen (NORCO/VICODIN) 5-325 MG per tablet Take 2 tablets by mouth every 4 (four) hours as needed for pain. 07/30/12   Fransico Meadow, PA-C  metFORMIN (GLUCOPHAGE) 500 MG tablet Take 500 mg by mouth 2 (two) times daily with a meal.    Historical Provider, MD  metFORMIN (GLUCOPHAGE) 500 MG tablet Take 1 tablet (500 mg total) by mouth 2 (two) times daily with a meal. 10/04/12 10/04/13  Davonna Belling, MD  omeprazole (PRILOSEC) 20 MG capsule Take 1 capsule (20 mg total) by mouth daily. 11/05/14   Carmin Muskrat, MD  potassium chloride SA (K-DUR,KLOR-CON) 20 MEQ tablet Take 1 tablet (20 mEq total) by mouth 2 (two) times daily. 05/17/14   Pattricia Boss, MD   BP 147/85 mmHg  Pulse 65  Temp(Src) 98.5 F (36.9 C) (Oral)  Resp 18  Ht 5\' 4"  (1.626 m)  Wt 220 lb (99.791 kg)  BMI 37.74 kg/m2  SpO2 96% Physical Exam  Constitutional: She is oriented to person, place, and time. She appears well-developed and well-nourished. No distress.  HENT:  Head: Normocephalic and atraumatic.  Right Ear: Tympanic membrane normal.  Left Ear: Tympanic membrane normal.  Nose: Nose normal.  Mouth/Throat: Uvula is midline, oropharynx is clear and moist and mucous membranes are normal.  Eyes: EOM are normal.  Neck: Normal range  of motion.  Cardiovascular: Normal rate, regular rhythm and normal heart sounds.  Exam reveals no friction rub.   No murmur heard. Pulmonary/Chest: Effort normal. No respiratory distress. She has no wheezes. She has no rales.  Mildly decreased breath sounds throughout.   Abdominal: Soft. Bowel sounds are normal. She exhibits no distension and no mass. There is no tenderness. There is no rebound and no guarding.  Musculoskeletal: Normal range of motion.  Neurological: She is alert and oriented to person, place, and time.   Skin: Skin is warm and dry.  Psychiatric: She has a normal mood and affect. Judgment normal.  Nursing note and vitals reviewed.   ED Course  Procedures (including critical care time) DIAGNOSTIC STUDIES: Oxygen Saturation is 96% on RA, normal by my interpretation.    COORDINATION OF CARE: 3:05 PM Discussed treatment plan which includes CXR, zithromax, and hycodan with pt at bedside and pt agreed to plan.  Labs Review Labs Reviewed - No data to display  Imaging Review Dg Chest 2 View  01/04/2016  CLINICAL DATA:  Cough and congestion with fever for 4 days EXAM: CHEST  2 VIEW COMPARISON:  10/11/2013 chest radiograph. FINDINGS: Stable cardiomediastinal silhouette with normal heart size. No pneumothorax. No pleural effusion. Lungs appear clear, with no acute consolidative airspace disease and no pulmonary edema. IMPRESSION: No active cardiopulmonary disease. Electronically Signed   By: Ilona Sorrel M.D.   On: 01/04/2016 14:44   I have personally reviewed and evaluated these images as part of my medical decision-making.   EKG Interpretation None      MDM   Final diagnoses:  URI (upper respiratory infection)   Pt with symptoms consistent with viral URI.  Well appearing here.  No signs of breathing difficulty  No signs of pharyngitis, otitis or abnormal abdominal findings.   CXR wnl and pt to return with any further problems.   I personally performed the services described in this documentation, which was scribed in my presence.  The recorded information has been reviewed and considered.    Blanchie Dessert, MD 01/04/16 [phone]

## 2016-01-04 NOTE — ED Notes (Signed)
Pt in c/o cough and fever x 4 days. Alert, interactive, NAD, cough appreciated.

## 2016-07-23 LAB — GLUCOSE, POCT (MANUAL RESULT ENTRY): POC Glucose: 104 mg/dl — AB (ref 70–99)

## 2017-05-24 ENCOUNTER — Emergency Department (HOSPITAL_BASED_OUTPATIENT_CLINIC_OR_DEPARTMENT_OTHER)
Admission: EM | Admit: 2017-05-24 | Discharge: 2017-05-24 | Disposition: A | Discharge status: Home / Self Care | Payer: Self-pay | Attending: Emergency Medicine | Admitting: Emergency Medicine

## 2017-05-24 ENCOUNTER — Emergency Department (HOSPITAL_BASED_OUTPATIENT_CLINIC_OR_DEPARTMENT_OTHER): Payer: Self-pay

## 2017-05-24 ENCOUNTER — Encounter (HOSPITAL_BASED_OUTPATIENT_CLINIC_OR_DEPARTMENT_OTHER): Payer: Self-pay

## 2017-05-24 DIAGNOSIS — E119 Type 2 diabetes mellitus without complications: Secondary | ICD-10-CM | POA: Insufficient documentation

## 2017-05-24 DIAGNOSIS — Z79899 Other long term (current) drug therapy: Secondary | ICD-10-CM | POA: Insufficient documentation

## 2017-05-24 DIAGNOSIS — Z7984 Long term (current) use of oral hypoglycemic drugs: Secondary | ICD-10-CM | POA: Insufficient documentation

## 2017-05-24 DIAGNOSIS — R1084 Generalized abdominal pain: Secondary | ICD-10-CM

## 2017-05-24 DIAGNOSIS — J45909 Unspecified asthma, uncomplicated: Secondary | ICD-10-CM | POA: Insufficient documentation

## 2017-05-24 DIAGNOSIS — N3000 Acute cystitis without hematuria: Secondary | ICD-10-CM | POA: Insufficient documentation

## 2017-05-24 DIAGNOSIS — I10 Essential (primary) hypertension: Secondary | ICD-10-CM | POA: Insufficient documentation

## 2017-05-24 LAB — URINALYSIS, ROUTINE W REFLEX MICROSCOPIC
BILIRUBIN URINE: NEGATIVE
GLUCOSE, UA: NEGATIVE mg/dL
KETONES UR: 15 mg/dL — AB
Leukocytes, UA: NEGATIVE
Nitrite: NEGATIVE
PH: 6 (ref 5.0–8.0)
Protein, ur: 30 mg/dL — AB
Specific Gravity, Urine: >1.03 — ABNORMAL HIGH (ref 1.005–1.030)

## 2017-05-24 LAB — CBC WITH DIFFERENTIAL/PLATELET
BASOS ABS: 0 10*3/uL (ref 0.0–0.1)
Basophils Relative: 0 %
Eosinophils Absolute: 0.4 10*3/uL (ref 0.0–0.7)
Eosinophils Relative: 4 %
HEMATOCRIT: 38.4 % (ref 36.0–46.0)
HEMOGLOBIN: 12.5 g/dL (ref 12.0–15.0)
LYMPHS PCT: 37 %
Lymphs Abs: 3.7 10*3/uL (ref 0.7–4.0)
MCH: 27.8 pg (ref 26.0–34.0)
MCHC: 32.6 g/dL (ref 30.0–36.0)
MCV: 85.3 fL (ref 78.0–100.0)
MONO ABS: 0.5 10*3/uL (ref 0.1–1.0)
Monocytes Relative: 5 %
NEUTROS ABS: 5.4 10*3/uL (ref 1.7–7.7)
NEUTROS PCT: 54 %
Platelets: 227 10*3/uL (ref 150–400)
RBC: 4.5 MIL/uL (ref 3.87–5.11)
RDW: 15.2 % (ref 11.5–15.5)
WBC: 10 10*3/uL (ref 4.0–10.5)

## 2017-05-24 LAB — COMPREHENSIVE METABOLIC PANEL
ALK PHOS: 113 U/L (ref 38–126)
ALT: 15 U/L (ref 14–54)
AST: 24 U/L (ref 15–41)
Albumin: 3.9 g/dL (ref 3.5–5.0)
Anion gap: 8 (ref 5–15)
BILIRUBIN TOTAL: 0.4 mg/dL (ref 0.3–1.2)
BUN: 14 mg/dL (ref 6–20)
CALCIUM: 9.6 mg/dL (ref 8.9–10.3)
CO2: 28 mmol/L (ref 22–32)
CREATININE: 0.56 mg/dL (ref 0.44–1.00)
Chloride: 103 mmol/L (ref 101–111)
GFR calc Af Amer: >60 mL/min (ref >60)
Glucose, Bld: 233 mg/dL — ABNORMAL HIGH (ref 65–99)
POTASSIUM: 3.1 mmol/L — AB (ref 3.5–5.1)
Sodium: 139 mmol/L (ref 135–145)
TOTAL PROTEIN: 8 g/dL (ref 6.5–8.1)

## 2017-05-24 LAB — URINALYSIS, MICROSCOPIC (REFLEX)

## 2017-05-24 LAB — LIPASE, BLOOD: LIPASE: 30 U/L (ref 11–51)

## 2017-05-24 MED ORDER — HYDROCODONE-ACETAMINOPHEN 5-325 MG PO TABS: 1.0000 | ORAL_TABLET | ORAL | 0 refills | Status: DC | PRN

## 2017-05-24 MED ORDER — SULFAMETHOXAZOLE-TRIMETHOPRIM 800-160 MG PO TABS
1.0000 | ORAL_TABLET | Freq: Once | ORAL | Status: AC
  Administered 2017-05-24: 1 via ORAL
  Filled 2017-05-24: qty 1

## 2017-05-24 MED ORDER — ONDANSETRON HCL 4 MG/2ML IJ SOLN
4.0000 mg | Freq: Once | INTRAMUSCULAR | Status: AC
  Administered 2017-05-24: 4 mg via INTRAVENOUS
  Filled 2017-05-24: qty 2

## 2017-05-24 MED ORDER — SULFAMETHOXAZOLE-TRIMETHOPRIM 800-160 MG PO TABS: 1.0000 | ORAL_TABLET | Freq: Two times a day (BID) | ORAL | 0 refills | Status: AC

## 2017-05-24 MED ORDER — SODIUM CHLORIDE 0.9 % IV BOLUS (SEPSIS)
1000.0000 mL | Freq: Once | INTRAVENOUS | Status: AC
  Administered 2017-05-24: 1000 mL via INTRAVENOUS

## 2017-05-24 MED ORDER — MORPHINE SULFATE (PF) 4 MG/ML IV SOLN
4.0000 mg | Freq: Once | INTRAVENOUS | Status: AC
  Administered 2017-05-24: 4 mg via INTRAVENOUS
  Filled 2017-05-24: qty 1

## 2017-05-24 MED ORDER — IOPAMIDOL (ISOVUE-300) INJECTION 61%
100.0000 mL | Freq: Once | INTRAVENOUS | Status: AC | PRN
  Administered 2017-05-24: 100 mL via INTRAVENOUS

## 2017-05-24 NOTE — ED Triage Notes (Signed)
C/o abd pain since May-blood in urine prompted her to be seen was seen by PCP last week- dx with "abd swelling and given abx that she hasn't started"-NAD-steady gait-daughter interpreting for pt

## 2017-05-24 NOTE — ED Provider Notes (Signed)
[place] [facility] Provider Note   CSN: [id number] Arrival date & time: 05/24/17  1113     History   Chief Complaint Chief Complaint  Patient presents with  . Abdominal Pain    HPI Heidi Davis is a 56 y.o. female.  Pt presents to the ED today with abdominal pain.  Pt has had it intermittently since May.  She has been to the ED for this in the past as well.  The pt denies any n/v.  No f/c.  No diarrhea or constipation.  Pt did see her pcp last week and was rx'd an abx for blood in her urine.  Pt has not taken it.  Pt does speak Arabic.  Her daughter is translating.       Past Medical History:  Diagnosis Date  . Asthma    long times ago- 12 yrs ago. no meds  . Diabetes mellitus   . GERD (gastroesophageal reflux disease)   . Hypertension     Patient Active Problem List   Diagnosis Date Noted  . HTN (hypertension) 10/20/2013  . Morbid obesity ([place]) 10/20/2013    Past Surgical History:  Procedure Laterality Date  . ABDOMINAL HYSTERECTOMY    . COLONOSCOPY WITH PROPOFOL N/A 11/06/2015   Procedure: COLONOSCOPY WITH PROPOFOL;  Surgeon: Ronald Lobo, MD;  Location: [facility];  Service: Endoscopy;  Laterality: N/A;    OB History    No data available       Home Medications    Prior to Admission medications   Medication Sig Start Date End Date Taking? Authorizing Provider  atenolol-chlorthalidone (TENORETIC) 50-25 MG per tablet Take 1 tablet by mouth daily.    [provider]  azithromycin (ZITHROMAX) 250 MG tablet Take 1 tablet (250 mg total) by mouth daily. Take first 2 tablets together, then 1 every day until finished. 01/04/16   Blanchie Dessert, MD  HYDROcodone-acetaminophen (NORCO/VICODIN) 5-325 MG tablet Take 1 tablet by mouth every 4 (four) hours as needed. 05/24/17   Isla Pence, MD  HYDROcodone-homatropine [facility]) 5-1.5 MG/5ML syrup Take 5 mLs by mouth every 6 (six) hours as needed for cough. 01/04/16   Blanchie Dessert, MD    metFORMIN (GLUCOPHAGE) 500 MG tablet Take 500 mg by mouth 2 (two) times daily with a meal.    [provider]  metFORMIN (GLUCOPHAGE) 500 MG tablet Take 1 tablet (500 mg total) by mouth 2 (two) times daily with a meal. 10/04/12 10/04/13  Davonna Belling, MD  omeprazole (PRILOSEC) 20 MG capsule Take 1 capsule (20 mg total) by mouth daily. 11/05/14   Carmin Muskrat, MD  potassium chloride SA (K-DUR,KLOR-CON) 20 MEQ tablet Take 1 tablet (20 mEq total) by mouth 2 (two) times daily. 05/17/14   Pattricia Boss, MD  sulfamethoxazole-trimethoprim (BACTRIM DS,SEPTRA DS) 800-160 MG tablet Take 1 tablet by mouth 2 (two) times daily. 05/24/17 05/31/17  Isla Pence, MD    Family History No family history on file.  Social History Social History  Substance Use Topics  . Smoking status: Never Smoker  . Smokeless tobacco: Never Used  . Alcohol use No     Allergies   Patient has no known allergies.   Review of Systems Review of Systems  Gastrointestinal: Positive for abdominal pain.  All other systems reviewed and are negative.    Physical Exam Updated Vital Signs BP 112/66   Pulse 64   Temp 98.4 F (36.9 C) (Oral)   Resp 20   Ht 5\' 4"  (1.626 m)  Wt 97.1 kg (214 lb)   SpO2 99%   BMI 36.73 kg/m   Physical Exam  Constitutional: She is oriented to person, place, and time. She appears well-developed and well-nourished.  HENT:  Head: Normocephalic and atraumatic.  Right Ear: External ear normal.  Left Ear: External ear normal.  Nose: Nose normal.  Mouth/Throat: Oropharynx is clear and moist.  Eyes: Pupils are equal, round, and reactive to light. Conjunctivae and EOM are normal.  Neck: Normal range of motion. Neck supple.  Cardiovascular: Normal rate, regular rhythm, normal heart sounds and intact distal pulses.   Pulmonary/Chest: Effort normal and breath sounds normal.  Abdominal: There is tenderness in the right lower quadrant.  Musculoskeletal: Normal range of motion.   Neurological: She is alert and oriented to person, place, and time.  Skin: Skin is warm. Capillary refill takes less than 2 seconds.  Psychiatric: She has a normal mood and affect. Her behavior is normal. Judgment and thought content normal.  Nursing note and vitals reviewed.    ED Treatments / Results  Labs (all labs ordered are listed, but only abnormal results are displayed) Labs Reviewed  COMPREHENSIVE METABOLIC PANEL - Abnormal; Notable for the following:       Result Value   Potassium 3.1 (*)    Glucose, Bld 233 (*)    All other components within normal limits  URINALYSIS, ROUTINE W REFLEX MICROSCOPIC - Abnormal; Notable for the following:    APPearance CLOUDY (*)    Specific Gravity, Urine >1.030 (*)    Hgb urine dipstick SMALL (*)    Ketones, ur 15 (*)    Protein, ur 30 (*)    All other components within normal limits  URINALYSIS, MICROSCOPIC (REFLEX) - Abnormal; Notable for the following:    Bacteria, UA MANY (*)    Squamous Epithelial / LPF TOO NUMEROUS TO COUNT (*)    All other components within normal limits  CBC WITH DIFFERENTIAL/PLATELET  LIPASE, BLOOD    EKG  EKG Interpretation None       Radiology Ct Abdomen Pelvis W Contrast  Result Date: 05/24/2017 CLINICAL DATA:  Chronic right-sided abdominal pain. EXAM: CT ABDOMEN AND PELVIS WITH CONTRAST TECHNIQUE: Multidetector CT imaging of the abdomen and pelvis was performed using the standard protocol following bolus administration of intravenous contrast. CONTRAST:  195mL ISOVUE-300 IOPAMIDOL (ISOVUE-300) INJECTION 61% COMPARISON:  CT scan of May 17, 2014. FINDINGS: Lower chest: No acute abnormality. Hepatobiliary: No focal liver abnormality is seen. No gallstones, gallbladder wall thickening, or biliary dilatation. Pancreas: Unremarkable. No pancreatic ductal dilatation or surrounding inflammatory changes. Spleen: Normal in size without focal abnormality. Adrenals/Urinary Tract: Adrenal glands appear normal.  Bilateral renal cysts are noted. No hydronephrosis or renal obstruction is noted. No renal or ureteral calculi are noted. Urinary bladder appears normal. Stomach/Bowel: Stomach is within normal limits. Appendix appears normal. No evidence of bowel wall thickening, distention, or inflammatory changes. Vascular/Lymphatic: No significant vascular findings are present. No enlarged abdominal or pelvic lymph nodes. Reproductive: Uterus and bilateral adnexa are unremarkable. Other: No abdominal wall hernia or abnormality. No abdominopelvic ascites. Musculoskeletal: No acute or significant osseous findings. IMPRESSION: No acute abnormality is noted in the abdomen or pelvis. Electronically Signed   By: Marijo Conception, M.D.   On: 05/24/2017 13:45    Procedures Procedures (including critical care time)  Medications Ordered in ED Medications  sulfamethoxazole-trimethoprim (BACTRIM DS,SEPTRA DS) 800-160 MG per tablet 1 tablet (not administered)  morphine 4 MG/ML injection 4 mg (4 mg Intravenous  Given 05/24/17 1217)  ondansetron (ZOFRAN) injection 4 mg (4 mg Intravenous Given 05/24/17 1210)  sodium chloride 0.9 % bolus 1,000 mL (0 mLs Intravenous Stopped 05/24/17 1309)  iopamidol (ISOVUE-300) 61 % injection 100 mL (100 mLs Intravenous Contrast Given 05/24/17 1315)     Initial Impression / Assessment and Plan / ED Course  I have reviewed the triage vital signs and the nursing notes.  Pertinent labs & imaging results that were available during my care of the patient were reviewed by me and considered in my medical decision making (see chart for details).    Pt is feeling much better.  She will be started on bactrim.  She knows to return if worse.  F/u with pcp.  Final Clinical Impressions(s) / ED Diagnoses   Final diagnoses:  Generalized abdominal pain  Acute cystitis without hematuria    New Prescriptions New Prescriptions   HYDROCODONE-ACETAMINOPHEN (NORCO/VICODIN) 5-325 MG TABLET    Take 1 tablet by  mouth every 4 (four) hours as needed.   SULFAMETHOXAZOLE-TRIMETHOPRIM (BACTRIM DS,SEPTRA DS) 800-160 MG TABLET    Take 1 tablet by mouth 2 (two) times daily.     Isla Pence, MD 05/24/17 1355

## 2017-05-24 NOTE — ED Notes (Signed)
ED Provider at bedside. 

## 2017-05-24 NOTE — ED Notes (Signed)
Patient transported to CT 

## 2017-05-24 NOTE — ED Notes (Signed)
Pt educated about not driving or performing other critical tasks (such as operating heavy machinery, caring for infant/toddler/child) due to sedative nature of medications received in ED. Also warned about risks of consuming alcohol or taking other medications with sedative properties. Pt/caregiver verbalized understanding.

## 2019-06-06 ENCOUNTER — Ambulatory Visit
Admission: RE | Admit: 2019-06-06 | Discharge: 2019-06-06 | Disposition: A | Discharge status: Home / Self Care | Payer: No Typology Code available for payment source | Source: Ambulatory Visit | Attending: Family Medicine | Admitting: Family Medicine

## 2019-06-06 ENCOUNTER — Other Ambulatory Visit: Payer: Self-pay | Admitting: Family Medicine

## 2019-06-06 ENCOUNTER — Other Ambulatory Visit: Payer: Self-pay

## 2019-06-06 DIAGNOSIS — M545 Low back pain, unspecified: Secondary | ICD-10-CM

## 2019-06-06 DIAGNOSIS — R200 Anesthesia of skin: Secondary | ICD-10-CM

## 2019-07-02 ENCOUNTER — Other Ambulatory Visit: Payer: Self-pay

## 2019-07-02 DIAGNOSIS — Z20822 Contact with and (suspected) exposure to covid-19: Secondary | ICD-10-CM

## 2019-07-03 LAB — NOVEL CORONAVIRUS, NAA: SARS-CoV-2, NAA: NOT DETECTED

## 2020-03-15 ENCOUNTER — Other Ambulatory Visit: Payer: Self-pay

## 2020-03-15 DIAGNOSIS — I10 Essential (primary) hypertension: Secondary | ICD-10-CM | POA: Insufficient documentation

## 2020-03-15 DIAGNOSIS — Z79899 Other long term (current) drug therapy: Secondary | ICD-10-CM | POA: Insufficient documentation

## 2020-03-15 DIAGNOSIS — E119 Type 2 diabetes mellitus without complications: Secondary | ICD-10-CM | POA: Insufficient documentation

## 2020-03-15 DIAGNOSIS — R1013 Epigastric pain: Secondary | ICD-10-CM | POA: Insufficient documentation

## 2020-03-15 DIAGNOSIS — Z7984 Long term (current) use of oral hypoglycemic drugs: Secondary | ICD-10-CM | POA: Insufficient documentation

## 2020-03-15 DIAGNOSIS — R1012 Left upper quadrant pain: Secondary | ICD-10-CM | POA: Insufficient documentation

## 2020-03-15 DIAGNOSIS — J45909 Unspecified asthma, uncomplicated: Secondary | ICD-10-CM | POA: Insufficient documentation

## 2020-03-16 ENCOUNTER — Encounter (HOSPITAL_BASED_OUTPATIENT_CLINIC_OR_DEPARTMENT_OTHER): Payer: Self-pay | Admitting: Emergency Medicine

## 2020-03-16 ENCOUNTER — Emergency Department (HOSPITAL_BASED_OUTPATIENT_CLINIC_OR_DEPARTMENT_OTHER)
Admission: EM | Admit: 2020-03-16 | Discharge: 2020-03-16 | Disposition: A | Discharge status: Home / Self Care | Payer: No Typology Code available for payment source | Attending: Emergency Medicine | Admitting: Emergency Medicine

## 2020-03-16 DIAGNOSIS — R1010 Upper abdominal pain, unspecified: Secondary | ICD-10-CM

## 2020-03-16 LAB — COMPREHENSIVE METABOLIC PANEL
ALT: 13 U/L (ref 0–44)
AST: 16 U/L (ref 15–41)
Albumin: 3.8 g/dL (ref 3.5–5.0)
Alkaline Phosphatase: 94 U/L (ref 38–126)
Anion gap: 9 (ref 5–15)
BUN: 12 mg/dL (ref 6–20)
CO2: 26 mmol/L (ref 22–32)
Calcium: 9.2 mg/dL (ref 8.9–10.3)
Chloride: 104 mmol/L (ref 98–111)
Creatinine, Ser: 0.5 mg/dL (ref 0.44–1.00)
GFR calc Af Amer: >60 mL/min (ref >60)
GFR calc non Af Amer: >60 mL/min (ref >60)
Glucose, Bld: 145 mg/dL — ABNORMAL HIGH (ref 70–99)
Potassium: 3.5 mmol/L (ref 3.5–5.1)
Sodium: 139 mmol/L (ref 135–145)
Total Bilirubin: 0.5 mg/dL (ref 0.3–1.2)
Total Protein: 7.7 g/dL (ref 6.5–8.1)

## 2020-03-16 LAB — LIPASE, BLOOD: Lipase: 28 U/L (ref 11–51)

## 2020-03-16 LAB — URINALYSIS, MICROSCOPIC (REFLEX)

## 2020-03-16 LAB — URINALYSIS, ROUTINE W REFLEX MICROSCOPIC
Bilirubin Urine: NEGATIVE
Glucose, UA: NEGATIVE mg/dL
Ketones, ur: NEGATIVE mg/dL
Leukocytes,Ua: NEGATIVE
Nitrite: NEGATIVE
Protein, ur: NEGATIVE mg/dL
Specific Gravity, Urine: >1.03 — ABNORMAL HIGH (ref 1.005–1.030)
pH: 5.5 (ref 5.0–8.0)

## 2020-03-16 LAB — CBC
HCT: 39.7 % (ref 36.0–46.0)
Hemoglobin: 12.8 g/dL (ref 12.0–15.0)
MCH: 28.6 pg (ref 26.0–34.0)
MCHC: 32.2 g/dL (ref 30.0–36.0)
MCV: 88.8 fL (ref 80.0–100.0)
Platelets: 230 10*3/uL (ref 150–400)
RBC: 4.47 MIL/uL (ref 3.87–5.11)
RDW: 14.4 % (ref 11.5–15.5)
WBC: 10.1 10*3/uL (ref 4.0–10.5)
nRBC: 0 % (ref 0.0–0.2)

## 2020-03-16 MED ORDER — ALUM & MAG HYDROXIDE-SIMETH 200-200-20 MG/5ML PO SUSP
30.0000 mL | Freq: Once | ORAL | Status: AC
  Administered 2020-03-16: 30 mL via ORAL
  Filled 2020-03-16: qty 30

## 2020-03-16 MED ORDER — HYOSCYAMINE SULFATE 0.125 MG SL SUBL
0.2500 mg | SUBLINGUAL_TABLET | Freq: Once | SUBLINGUAL | Status: AC
  Administered 2020-03-16: 0.25 mg via SUBLINGUAL
  Filled 2020-03-16: qty 2

## 2020-03-16 MED ORDER — LIDOCAINE VISCOUS HCL 2 % MT SOLN
15.0000 mL | Freq: Once | OROMUCOSAL | Status: AC
  Administered 2020-03-16: 15 mL via ORAL
  Filled 2020-03-16: qty 15

## 2020-03-16 NOTE — ED Provider Notes (Signed)
[facility] EMERGENCY DEPARTMENT Provider Note  CSN: [id number] Arrival date & time: 03/15/20 2356  Chief Complaint(s) Abdominal Pain and Back Pain  HPI Heidi Davis is a 59 y.o. female   The history is provided by the patient.    CC: abd pain  Onset/Duration: 4 days Timing: Intermittent,  Location: Mid and upper abdomen, radiating to the back Quality: Achy/cramping Severity: mild to severe, currently mild Modifying Factors:  Improved by: Resolved spontaneously  Worsened by: Eating Associated Signs/Symptoms:  Pertinent (+): None  Pertinent (-): Fevers, chills, nausea, vomiting, diarrhea, urinary symptoms.  No chest pain or shortness of breath. Context:   Past Medical History Past Medical History:  Diagnosis Date  . Asthma    long times ago- 12 yrs ago. no meds  . Diabetes mellitus   . GERD (gastroesophageal reflux disease)   . Hypertension    Patient Active Problem List   Diagnosis Date Noted  . HTN (hypertension) 10/20/2013  . Morbid obesity ([place]) 10/20/2013   Home Medication(s) Prior to Admission medications   Medication Sig Start Date End Date Taking? Authorizing Provider  atenolol-chlorthalidone (TENORETIC) 50-25 MG per tablet Take 1 tablet by mouth daily.    [provider]  azithromycin (ZITHROMAX) 250 MG tablet Take 1 tablet (250 mg total) by mouth daily. Take first 2 tablets together, then 1 every day until finished. 01/04/16   Blanchie Dessert, MD  HYDROcodone-acetaminophen (NORCO/VICODIN) 5-325 MG tablet Take 1 tablet by mouth every 4 (four) hours as needed. 05/24/17   Isla Pence, MD  HYDROcodone-homatropine [facility]) 5-1.5 MG/5ML syrup Take 5 mLs by mouth every 6 (six) hours as needed for cough. 01/04/16   Blanchie Dessert, MD  metFORMIN (GLUCOPHAGE) 500 MG tablet Take 500 mg by mouth 2 (two) times daily with a meal.    [provider]  metFORMIN (GLUCOPHAGE) 500 MG tablet Take 1 tablet (500 mg total) by mouth 2 (two)  times daily with a meal. 10/04/12 10/04/13  Davonna Belling, MD  omeprazole (PRILOSEC) 20 MG capsule Take 1 capsule (20 mg total) by mouth daily. 11/05/14   Carmin Muskrat, MD  potassium chloride SA (K-DUR,KLOR-CON) 20 MEQ tablet Take 1 tablet (20 mEq total) by mouth 2 (two) times daily. 05/17/14   Pattricia Boss, MD                                                                                                                                    Past Surgical History Past Surgical History:  Procedure Laterality Date  . ABDOMINAL HYSTERECTOMY    . COLONOSCOPY WITH PROPOFOL N/A 11/06/2015   Procedure: COLONOSCOPY WITH PROPOFOL;  Surgeon: Ronald Lobo, MD;  Location: [facility];  Service: Endoscopy;  Laterality: N/A;   Family History History reviewed. No pertinent family history.  Social History Social History   Tobacco Use  . Smoking status: Never Smoker  . Smokeless tobacco: Never Used  Substance Use Topics  .  Alcohol use: No  . Drug use: No   Allergies Patient has no known allergies.  Review of Systems Review of Systems All other systems are reviewed and are negative for acute change except as noted in the HPI  Physical Exam Vital Signs  I have reviewed the triage vital signs BP (!) 153/96 (BP Location: Right Arm)   Pulse (!) 53   Temp 98.8 F (37.1 C) (Oral)   Resp 19   Wt 96.8 kg   SpO2 100%   BMI 36.63 kg/m   Physical Exam Vitals reviewed.  Constitutional:      General: She is not in acute distress.    Appearance: She is well-developed. She is not diaphoretic.  HENT:     Head: Normocephalic and atraumatic.     Right Ear: External ear normal.     Left Ear: External ear normal.     Nose: Nose normal.  Eyes:     General: No scleral icterus.    Conjunctiva/sclera: Conjunctivae normal.  Neck:     Trachea: Phonation normal.  Cardiovascular:     Rate and Rhythm: Normal rate and regular rhythm.  Pulmonary:     Effort: Pulmonary effort is normal. No  respiratory distress.     Breath sounds: No stridor.  Abdominal:     General: There is no distension.     Tenderness: There is abdominal tenderness (mild discomfort) in the epigastric area and left upper quadrant. There is no guarding or rebound.  Musculoskeletal:        General: Normal range of motion.     Cervical back: Normal range of motion.  Neurological:     Mental Status: She is alert and oriented to person, place, and time.  Psychiatric:        Behavior: Behavior normal.     ED Results and Treatments Labs (all labs ordered are listed, but only abnormal results are displayed) Labs Reviewed  URINALYSIS, ROUTINE W REFLEX MICROSCOPIC - Abnormal; Notable for the following components:      Result Value   APPearance HAZY (*)    Specific Gravity, Urine >1.030 (*)    Hgb urine dipstick TRACE (*)    All other components within normal limits  URINALYSIS, MICROSCOPIC (REFLEX) - Abnormal; Notable for the following components:   Bacteria, UA MANY (*)    All other components within normal limits  COMPREHENSIVE METABOLIC PANEL - Abnormal; Notable for the following components:   Glucose, Bld 145 (*)    All other components within normal limits  LIPASE, BLOOD  CBC                                                                                                                         EKG  EKG Interpretation  Date/Time:    Ventricular Rate:    PR Interval:    QRS Duration:   QT Interval:    QTC Calculation:   R Axis:     Text Interpretation:  Radiology No results found.  Pertinent labs & imaging results that were available during my care of the patient were reviewed by me and considered in my medical decision making (see chart for details).  Medications Ordered in ED Medications  alum & mag hydroxide-simeth (MAALOX/MYLANTA) 200-200-20 MG/5ML suspension 30 mL (30 mLs Oral Given 03/16/20 0355)    And  lidocaine (XYLOCAINE) 2 % viscous mouth solution 15 mL (15 mLs Oral  Given 03/16/20 0355)  hyoscyamine (LEVSIN SL) SL tablet 0.25 mg (0.25 mg Sublingual Given 03/16/20 0355)                                                                                                                                    Procedures Procedures  (including critical care time)  Medical Decision Making / ED Course I have reviewed the nursing notes for this encounter and the patient's prior records (if available in EHR or on provided paperwork).   Analena M Dolata was evaluated in Emergency Department on 03/16/2020 for the symptoms described in the history of present illness. She was evaluated in the context of the global COVID-19 pandemic, which necessitated consideration that the patient might be at risk for infection with the SARS-CoV-2 virus that causes COVID-19. Institutional protocols and algorithms that pertain to the evaluation of patients at risk for COVID-19 are in a state of rapid change based on information released by regulatory bodies including the CDC and federal and state organizations. These policies and algorithms were followed during the patient's care in the ED.  Intermittent mid and upper abdominal cramping. Currently mild symptoms Mild discomfort to palpation in the upper abdomen without evidence of peritonitis Labs grossly reassuring without leukocytosis or anemia.  No significant electrolyte derangement or renal sufficiency.  No evidence of bili obstruction or pancreatitis. Mild hyperglycemia without evidence of DKA. Low suspicion for serious intra-abdominal inflammatory/infectious process or bowel obstruction.  Likely functional abdominal pain versus gastritis Provided with GI cocktail. resolved.      Final Clinical Impression(s) / ED Diagnoses Final diagnoses:  Intermittent upper abdominal pain   The patient appears reasonably screened and/or stabilized for discharge and I doubt any other medical condition or other [facility] requiring further screening,  evaluation, or treatment in the ED at this time prior to discharge. Safe for discharge with strict return precautions.  Disposition: Discharge  Condition: Good  I have discussed the results, Dx and Tx plan with the patient/family who expressed understanding and agree(s) with the plan. Discharge instructions discussed at length. The patient/family was given strict return precautions who verbalized understanding of the instructions. No further questions at time of discharge.    ED Discharge Orders    None       Follow Up: Vassie Moment, MD [place] [id number] [phone]  Schedule an appointment as soon as possible for a visit  As needed      This chart was dictated  using voice recognition software.  Despite best efforts to proofread,  errors can occur which can change the documentation meaning.   Fatima Blank, MD 03/16/20 [phone]

## 2020-03-16 NOTE — ED Triage Notes (Signed)
Intermittent abd pain and back pain for three days. Denies dysuria.

## 2020-10-04 ENCOUNTER — Other Ambulatory Visit: Payer: No Typology Code available for payment source

## 2020-12-19 ENCOUNTER — Other Ambulatory Visit: Payer: Self-pay | Admitting: Family Medicine

## 2020-12-19 DIAGNOSIS — Z1231 Encounter for screening mammogram for malignant neoplasm of breast: Secondary | ICD-10-CM

## 2020-12-23 ENCOUNTER — Ambulatory Visit: Payer: No Typology Code available for payment source

## 2021-09-28 ENCOUNTER — Emergency Department (HOSPITAL_BASED_OUTPATIENT_CLINIC_OR_DEPARTMENT_OTHER)
Admission: EM | Admit: 2021-09-28 | Discharge: 2021-09-28 | Disposition: A | Discharge status: Home / Self Care | Payer: No Typology Code available for payment source | Attending: Emergency Medicine | Admitting: Emergency Medicine

## 2021-09-28 ENCOUNTER — Emergency Department (HOSPITAL_BASED_OUTPATIENT_CLINIC_OR_DEPARTMENT_OTHER): Payer: No Typology Code available for payment source

## 2021-09-28 ENCOUNTER — Other Ambulatory Visit: Payer: Self-pay

## 2021-09-28 ENCOUNTER — Encounter (HOSPITAL_BASED_OUTPATIENT_CLINIC_OR_DEPARTMENT_OTHER): Payer: Self-pay

## 2021-09-28 DIAGNOSIS — J45909 Unspecified asthma, uncomplicated: Secondary | ICD-10-CM | POA: Insufficient documentation

## 2021-09-28 DIAGNOSIS — Z7984 Long term (current) use of oral hypoglycemic drugs: Secondary | ICD-10-CM | POA: Insufficient documentation

## 2021-09-28 DIAGNOSIS — Z79899 Other long term (current) drug therapy: Secondary | ICD-10-CM | POA: Insufficient documentation

## 2021-09-28 DIAGNOSIS — U071 COVID-19: Secondary | ICD-10-CM | POA: Insufficient documentation

## 2021-09-28 DIAGNOSIS — E119 Type 2 diabetes mellitus without complications: Secondary | ICD-10-CM | POA: Insufficient documentation

## 2021-09-28 DIAGNOSIS — I10 Essential (primary) hypertension: Secondary | ICD-10-CM | POA: Insufficient documentation

## 2021-09-28 DIAGNOSIS — Z28310 Unvaccinated for covid-19: Secondary | ICD-10-CM | POA: Insufficient documentation

## 2021-09-28 LAB — RESP PANEL BY RT-PCR (FLU A&B, COVID) ARPGX2
Influenza A by PCR: NEGATIVE
Influenza B by PCR: NEGATIVE
SARS Coronavirus 2 by RT PCR: POSITIVE — AB

## 2021-09-28 MED ORDER — AEROCHAMBER PLUS FLO-VU MEDIUM MISC
1.0000 | Freq: Once | Status: AC
  Administered 2021-09-28: 1
  Filled 2021-09-28: qty 1

## 2021-09-28 MED ORDER — ALBUTEROL SULFATE HFA 108 (90 BASE) MCG/ACT IN AERS
2.0000 | INHALATION_SPRAY | Freq: Once | RESPIRATORY_TRACT | Status: AC
  Administered 2021-09-28: 2 via RESPIRATORY_TRACT
  Filled 2021-09-28: qty 6.7

## 2021-09-28 NOTE — Discharge Instructions (Addendum)
Heidi Davis was seen in the ER today for her cough and fatigue. She has tested positive for COVID-19. You may manage her symptoms with tylenol as needed and her cough/shortness of breath with her albuterol inhaler as provided.  She will need to follow up with her primary care doctor for repeat xray to ensure resolution of the changes noted on her xray today.  Return to the ER if she develops any difficulty breathing, chest pain, nausea or vomiting that does not stop, or any other new severe symptoms.

## 2021-09-28 NOTE — ED Provider Notes (Addendum)
[facility] EMERGENCY DEPARTMENT Provider Note   CSN: [id number] Arrival date & time: 09/28/21  1604     History  Chief Complaint  Patient presents with   Nasal Congestion    Heidi Davis is a 61 y.o. female who presents with her daughter at the bedside with concern for 3 days of productive cough, congestion, subjective fever, and headache.  She endorses production of her cough with brownish mucus.  Occasionally has small streak of blood and then after a large coughing spell.  No vomiting or diarrhea.  Personally reviewed her medical records.  She has history of asthma in the past, diabetes, and hypertension.  She is not on any anticoagulation.  She has not had any COVID-19 or influenza vaccinations.  HPI     Home Medications Prior to Admission medications   Medication Sig Start Date End Date Taking? Authorizing Provider  atenolol-chlorthalidone (TENORETIC) 50-25 MG per tablet Take 1 tablet by mouth daily.    [provider]  azithromycin (ZITHROMAX) 250 MG tablet Take 1 tablet (250 mg total) by mouth daily. Take first 2 tablets together, then 1 every day until finished. 01/04/16   Blanchie Dessert, MD  HYDROcodone-acetaminophen (NORCO/VICODIN) 5-325 MG tablet Take 1 tablet by mouth every 4 (four) hours as needed. 05/24/17   Isla Pence, MD  HYDROcodone-homatropine [facility]) 5-1.5 MG/5ML syrup Take 5 mLs by mouth every 6 (six) hours as needed for cough. 01/04/16   Blanchie Dessert, MD  metFORMIN (GLUCOPHAGE) 500 MG tablet Take 500 mg by mouth 2 (two) times daily with a meal.    [provider]  metFORMIN (GLUCOPHAGE) 500 MG tablet Take 1 tablet (500 mg total) by mouth 2 (two) times daily with a meal. 10/04/12 10/04/13  Davonna Belling, MD  omeprazole (PRILOSEC) 20 MG capsule Take 1 capsule (20 mg total) by mouth daily. 11/05/14   Carmin Muskrat, MD  potassium chloride SA (K-DUR,KLOR-CON) 20 MEQ tablet Take 1 tablet (20 mEq total) by mouth 2 (two)  times daily. 05/17/14   Pattricia Boss, MD      Allergies    Patient has no known allergies.    Review of Systems   Review of Systems  Constitutional:  Negative for appetite change, chills, fatigue and fever.  HENT:  Positive for rhinorrhea. Negative for congestion and sore throat.   Eyes: Negative.   Respiratory:  Positive for cough, chest tightness and shortness of breath.   Cardiovascular: Negative.   Gastrointestinal: Negative.   Genitourinary: Negative.   Neurological:  Positive for headaches. Negative for dizziness, syncope and light-headedness.   Physical Exam Updated Vital Signs BP (!) 148/74 (BP Location: Left Arm)    Pulse 61    Temp 98.6 F (37 C) (Oral)    Resp 18    Ht 5\' 4"  (1.626 m)    Wt 97.5 kg    SpO2 98%    BMI 36.90 kg/m  Physical Exam Vitals and nursing note reviewed.  Constitutional:      Appearance: She is not toxic-appearing.  HENT:     Head: Normocephalic and atraumatic.     Nose: Congestion and rhinorrhea present.     Mouth/Throat:     Mouth: Mucous membranes are moist.     Pharynx: No oropharyngeal exudate or posterior oropharyngeal erythema.  Eyes:     General:        Right eye: No discharge.        Left eye: No discharge.     Extraocular Movements: Extraocular  movements intact.     Conjunctiva/sclera: Conjunctivae normal.     Pupils: Pupils are equal, round, and reactive to light.  Cardiovascular:     Rate and Rhythm: Normal rate and regular rhythm.     Pulses: Normal pulses.     Heart sounds: Normal heart sounds.  Pulmonary:     Effort: Pulmonary effort is normal. No tachypnea, accessory muscle usage, prolonged expiration or respiratory distress.     Breath sounds: Examination of the right-middle field reveals wheezing. Wheezing present. No rales.  Abdominal:     General: Bowel sounds are normal. There is no distension.     Palpations: Abdomen is soft.     Tenderness: There is no abdominal tenderness. There is no right CVA tenderness, left  CVA tenderness, guarding or rebound.  Musculoskeletal:        General: No deformity.     Cervical back: Neck supple.     Right lower leg: No edema.     Left lower leg: No edema.  Skin:    General: Skin is warm and dry.     Capillary Refill: Capillary refill takes less than 2 seconds.  Neurological:     General: No focal deficit present.     Mental Status: She is alert and oriented to person, place, and time. Mental status is at baseline.  Psychiatric:        Mood and Affect: Mood normal.    ED Results / Procedures / Treatments   Labs (all labs ordered are listed, but only abnormal results are displayed) Labs Reviewed  RESP PANEL BY RT-PCR (FLU A&B, COVID) ARPGX2 - Abnormal; Notable for the following components:      Result Value   SARS Coronavirus 2 by RT PCR POSITIVE (*)    All other components within normal limits    EKG None  Radiology DG Chest 2 View  Result Date: 09/28/2021 CLINICAL DATA:  cough EXAM: CHEST - 2 VIEW COMPARISON:  Chest x-ray 01/04/2016 FINDINGS: The heart and mediastinal contours are unchanged. Interval development of a right upper lobe peripheral airspace opacity. No pulmonary edema. No pleural effusion. No pneumothorax. No acute osseous abnormality. IMPRESSION: Interval development of a right upper lobe peripheral airspace opacity suggestive of infection/inflammation. Differential diagnosis includes a [place] hump of the pulmonary infarction not fully excluded. If clinically indicated, consider CT PA for further evaluation. Followup PA and lateral chest X-ray is recommended in 3-4 weeks following therapy to ensure resolution and exclude underlying malignancy. Electronically Signed   By: Iven Finn M.D.   On: 09/28/2021 19:47    Procedures Procedures    Medications Ordered in ED Medications  albuterol (VENTOLIN HFA) 108 (90 Base) MCG/ACT inhaler 2 puff (2 puffs Inhalation Given 09/28/21 1923)  AeroChamber Plus Flo-Vu Medium MISC 1 each (1 each Other  Given 09/28/21 1923)    ED Course/ Medical Decision Making/ A&P                           Medical Decision Making 61 year old female presents with concern for 3 days of cough and URI symptoms.  She is hypertensive on intake, but vital signs are otherwise normal.  She is not tachypneic or tachycardic throughout her stay in the emergency department and her oxygen saturation is normal.  Cardiopulmonary exam is normal with the exception of scant wheezing with expiratory phase in the right midlung.  Abdominal exam is benign.  Patient is neurovascularly intact.  She is not  dyspneic with speech.  Chest x-ray was obtained .  Right upper lobe and was reviewed and interpreted by me opacity suggestive of inflammation versus infection.  Radiologist read questioning possible [place] hump.  Clinically, very little concern for PE.  Respiratory pathogen panel is positive for COVID-19, patient has not recently traveled or had any prolonged immobilization or illnesses and has no history of malignancy or clot in the past.  She has not been tachypneic or tachycardic throughout her stay, and is worsening shortness of breath only with long coughing spells.  Albuterol offered with improvement in her chest tightness and resolution of previously identified wheezing.   Chest x-ray findings were discussed with the patient and her daughter at the bedside and I do recommend that she follow-up with her PCP for repeat x-ray in the next 3 to 4 weeks to ensure resolution of this abnormality, however given reassuring work-up and vital signs today I do not feel any further work-up is warranted in the ER at this time.  Patient's symptoms likely secondary to her newly diagnosed COVID-19 infection.  Do not feel admission is warranted given normal vital signs and patient's well appearance.Islay and her daughter voiced understanding of her medical evaluation and treatment plan.  Each of their questions was answered to their expressed  satisfaction.  Return precautions were given.  Patient was well-appearing, stable, and discharged in good condition.  Interview assisted by patient's daughter for translation when patient required it. Video translator offered, patient declined. Patient lives with her family, does not drink or consume illicit drugs.   This chart was dictated using voice recognition software, Dragon. Despite the best efforts of this provider to proofread and correct errors, errors may still occur which can change documentation meaning.  Heidi Davis was evaluated in Emergency Department on 09/29/2021 for the symptoms described in the history of present illness. She was evaluated in the context of the global COVID-19 pandemic, which necessitated consideration that the patient might be at risk for infection with the SARS-CoV-2 virus that causes COVID-19. Institutional protocols and algorithms that pertain to the evaluation of patients at risk for COVID-19 are in a state of rapid change based on information released by regulatory bodies including the CDC and federal and state organizations. These policies and algorithms were followed during the patient's care in the ED.   Final Clinical Impression(s) / ED Diagnoses Final diagnoses:  [id number]    Rx / DC Orders ED Discharge Orders     None         Emeline Darling, PA-C 09/29/21 1704    Karlea Mckibbin, Gypsy Balsam, PA-C 09/29/21 1705    Blanchie Dessert, MD 10/02/21 [phone]

## 2021-09-28 NOTE — ED Notes (Signed)
Called lab and spoke with [facility] about Respiratory panel. Showing in process, however taking longer than expected. States she remembers seeing the swab, looking into why test is delayed.

## 2021-09-28 NOTE — ED Triage Notes (Signed)
Pt arrives with daughter who reports patient has been having cold like symptoms for 3 days, nasal congestion, fever, and headaches.

## 2021-09-28 NOTE — ED Notes (Signed)
RT assessed in triage. Croupy sounding cough, but good air movement. SAT 98%. RT to monitor as needed

## 2021-10-13 ENCOUNTER — Other Ambulatory Visit: Payer: Self-pay

## 2021-10-13 DIAGNOSIS — Z1231 Encounter for screening mammogram for malignant neoplasm of breast: Secondary | ICD-10-CM

## 2021-10-14 ENCOUNTER — Other Ambulatory Visit (HOSPITAL_BASED_OUTPATIENT_CLINIC_OR_DEPARTMENT_OTHER): Payer: Self-pay | Admitting: Family Medicine

## 2021-10-14 DIAGNOSIS — U071 COVID-19: Secondary | ICD-10-CM

## 2021-10-27 ENCOUNTER — Ambulatory Visit (HOSPITAL_BASED_OUTPATIENT_CLINIC_OR_DEPARTMENT_OTHER)
Admission: RE | Admit: 2021-10-27 | Discharge: 2021-10-27 | Disposition: A | Discharge status: Home / Self Care | Payer: No Typology Code available for payment source | Source: Ambulatory Visit | Attending: Family Medicine | Admitting: Family Medicine

## 2021-10-27 ENCOUNTER — Other Ambulatory Visit: Payer: Self-pay

## 2021-10-27 DIAGNOSIS — U071 COVID-19: Secondary | ICD-10-CM | POA: Insufficient documentation

## 2021-12-29 ENCOUNTER — Emergency Department (HOSPITAL_BASED_OUTPATIENT_CLINIC_OR_DEPARTMENT_OTHER): Payer: No Typology Code available for payment source

## 2021-12-29 ENCOUNTER — Encounter (HOSPITAL_COMMUNITY): Payer: Self-pay

## 2021-12-29 ENCOUNTER — Emergency Department (HOSPITAL_COMMUNITY): Payer: No Typology Code available for payment source

## 2021-12-29 ENCOUNTER — Emergency Department (HOSPITAL_COMMUNITY)
Admission: EM | Admit: 2021-12-29 | Discharge: 2021-12-29 | Disposition: A | Discharge status: Home / Self Care | Payer: No Typology Code available for payment source | Attending: Emergency Medicine | Admitting: Emergency Medicine

## 2021-12-29 DIAGNOSIS — M25551 Pain in right hip: Secondary | ICD-10-CM | POA: Insufficient documentation

## 2021-12-29 DIAGNOSIS — Z7984 Long term (current) use of oral hypoglycemic drugs: Secondary | ICD-10-CM | POA: Insufficient documentation

## 2021-12-29 DIAGNOSIS — R600 Localized edema: Secondary | ICD-10-CM | POA: Insufficient documentation

## 2021-12-29 DIAGNOSIS — R609 Edema, unspecified: Secondary | ICD-10-CM

## 2021-12-29 DIAGNOSIS — M7989 Other specified soft tissue disorders: Secondary | ICD-10-CM

## 2021-12-29 DIAGNOSIS — M79604 Pain in right leg: Secondary | ICD-10-CM | POA: Insufficient documentation

## 2021-12-29 DIAGNOSIS — E119 Type 2 diabetes mellitus without complications: Secondary | ICD-10-CM | POA: Insufficient documentation

## 2021-12-29 DIAGNOSIS — M25561 Pain in right knee: Secondary | ICD-10-CM | POA: Insufficient documentation

## 2021-12-29 LAB — COMPREHENSIVE METABOLIC PANEL
ALT: 16 U/L (ref 0–44)
AST: 19 U/L (ref 15–41)
Albumin: 3.9 g/dL (ref 3.5–5.0)
Alkaline Phosphatase: 89 U/L (ref 38–126)
Anion gap: 5 (ref 5–15)
BUN: 9 mg/dL (ref 8–23)
CO2: 31 mmol/L (ref 22–32)
Calcium: 10.3 mg/dL (ref 8.9–10.3)
Chloride: 105 mmol/L (ref 98–111)
Creatinine, Ser: 0.54 mg/dL (ref 0.44–1.00)
GFR, Estimated: >60 mL/min (ref >60)
Glucose, Bld: 132 mg/dL — ABNORMAL HIGH (ref 70–99)
Potassium: 4.2 mmol/L (ref 3.5–5.1)
Sodium: 141 mmol/L (ref 135–145)
Total Bilirubin: 0.5 mg/dL (ref 0.3–1.2)
Total Protein: 8.3 g/dL — ABNORMAL HIGH (ref 6.5–8.1)

## 2021-12-29 LAB — CBC WITH DIFFERENTIAL/PLATELET
Abs Immature Granulocytes: 0.03 10*3/uL (ref 0.00–0.07)
Basophils Absolute: 0.1 10*3/uL (ref 0.0–0.1)
Basophils Relative: 1 %
Eosinophils Absolute: 0.6 10*3/uL — ABNORMAL HIGH (ref 0.0–0.5)
Eosinophils Relative: 6 %
HCT: 40.8 % (ref 36.0–46.0)
Hemoglobin: 13.3 g/dL (ref 12.0–15.0)
Immature Granulocytes: 0 %
Lymphocytes Relative: 48 %
Lymphs Abs: 4.5 10*3/uL — ABNORMAL HIGH (ref 0.7–4.0)
MCH: 28.9 pg (ref 26.0–34.0)
MCHC: 32.6 g/dL (ref 30.0–36.0)
MCV: 88.7 fL (ref 80.0–100.0)
Monocytes Absolute: 0.5 10*3/uL (ref 0.1–1.0)
Monocytes Relative: 6 %
Neutro Abs: 3.6 10*3/uL (ref 1.7–7.7)
Neutrophils Relative %: 39 %
Platelets: 260 10*3/uL (ref 150–400)
RBC: 4.6 MIL/uL (ref 3.87–5.11)
RDW: 14.6 % (ref 11.5–15.5)
WBC: 9.2 10*3/uL (ref 4.0–10.5)
nRBC: 0 % (ref 0.0–0.2)

## 2021-12-29 NOTE — ED Triage Notes (Signed)
Pt reports right upper leg pain X3 days. PCP sent pt over for [place] and xray of right knee.  ? ?A/Ox4 ?Ambulatory in triage  ?

## 2021-12-29 NOTE — Discharge Instructions (Addendum)
It looks as if there is some arthritis in the knee.  Follow-up with your doctor as needed.  There was no blood clot in the leg ?

## 2021-12-29 NOTE — Progress Notes (Signed)
BLE venous duplex has been completed.  Preliminary results messaged to Janeece Fitting, PA-C via secure chat. ? ?Results can be found under chart review under CV PROC. ?12/29/2021 3:54 PM ?Angella Montas RVT, RDMS ? ?

## 2021-12-29 NOTE — ED Provider Notes (Signed)
?[facility] DEPT ?Provider Note ? ? ?CSN: [id number] ?Arrival date & time: 12/29/21  1254 ? ?  ? ?History ? ?Chief Complaint  ?Patient presents with  ? Leg Pain  ?  right  ? ? ?Heidi Davis is a 61 y.o. female. ? ? ?Leg Pain ?Patient sent in by PCP for evaluation of right leg pain and swelling.  Is had for around 3 days.  Reportedly has had pain in the leg however for around a year and a half after a fall.  Pain is in the right knee and goes to the right hip at times.  More swelling.  No chest pain or trouble breathing.  No fevers.  Sent in for x-ray and ultrasound reportedly.  Paperwork did not come with the patient however.  Patient is diabetic.  No fevers or chills.  No chest pain.  Patient speaks Arabic and translated by person with her.  Offered separate medical interpreter and she refused ?  ? ?Home Medications ?Prior to Admission medications   ?Medication Sig Start Date End Date Taking? Authorizing Provider  ?atenolol-chlorthalidone (TENORETIC) 50-25 MG per tablet Take 1 tablet by mouth daily.    [provider]  ?azithromycin (ZITHROMAX) 250 MG tablet Take 1 tablet (250 mg total) by mouth daily. Take first 2 tablets together, then 1 every day until finished. 01/04/16   Blanchie Dessert, MD  ?HYDROcodone-acetaminophen (NORCO/VICODIN) 5-325 MG tablet Take 1 tablet by mouth every 4 (four) hours as needed. 05/24/17   Isla Pence, MD  ?HYDROcodone-homatropine [facility]) 5-1.5 MG/5ML syrup Take 5 mLs by mouth every 6 (six) hours as needed for cough. 01/04/16   Blanchie Dessert, MD  ?metFORMIN (GLUCOPHAGE) 500 MG tablet Take 500 mg by mouth 2 (two) times daily with a meal.    [provider]  ?metFORMIN (GLUCOPHAGE) 500 MG tablet Take 1 tablet (500 mg total) by mouth 2 (two) times daily with a meal. 10/04/12 10/04/13  Davonna Belling, MD  ?omeprazole (PRILOSEC) 20 MG capsule Take 1 capsule (20 mg total) by mouth daily. 11/05/14   Carmin Muskrat, MD  ?potassium  chloride SA (K-DUR,KLOR-CON) 20 MEQ tablet Take 1 tablet (20 mEq total) by mouth 2 (two) times daily. 05/17/14   Pattricia Boss, MD  ?   ? ?Allergies    ?Patient has no known allergies.   ? ?Review of Systems   ?Review of Systems  ?Constitutional:  Negative for appetite change.  ?Cardiovascular:  Positive for leg swelling.  ? ?Physical Exam ?Updated Vital Signs ?BP (!) 146/88   Pulse 66   Temp 98.2 ?F (36.8 ?C) (Oral)   Resp 14   SpO2 98%  ?Physical Exam ?Vitals and nursing note reviewed.  ?HENT:  ?   Head: Atraumatic.  ?Cardiovascular:  ?   Rate and Rhythm: Regular rhythm.  ?Pulmonary:  ?   Breath sounds: Normal breath sounds.  ?Abdominal:  ?   Tenderness: There is no abdominal tenderness.  ?Musculoskeletal:     ?   General: Tenderness present.  ?   Comments: Mild tenderness to right knee.  Mild tenderness right hip.  Good range of motion.  Neurovascular intact distally.  Pulse intact bilateral feet.  Does have some edema on left lower extremity but worse on right lower extremity.  ?Skin: ?   General: Skin is warm.  ?   Capillary Refill: Capillary refill takes less than 2 seconds.  ?Neurological:  ?   Mental Status: She is alert.  ? ? ?ED Results /  Procedures / Treatments   ?Labs ?(all labs ordered are listed, but only abnormal results are displayed) ?Labs Reviewed  ?CBC WITH DIFFERENTIAL/PLATELET - Abnormal; Notable for the following components:  ?    Result Value  ? Lymphs Abs 4.5 (*)   ? Eosinophils Absolute 0.6 (*)   ? All other components within normal limits  ?COMPREHENSIVE METABOLIC PANEL - Abnormal; Notable for the following components:  ? Glucose, Bld 132 (*)   ? Total Protein 8.3 (*)   ? All other components within normal limits  ? ? ?EKG ?None ? ?Radiology ?DG Knee Complete 4 Views Right ? ?Result Date: 12/29/2021 ?CLINICAL DATA:  Pain right upper leg pain for 3 days. EXAM: RIGHT KNEE - COMPLETE 4+ VIEW COMPARISON:  None. FINDINGS: No evidence of fracture or dislocation. Mild medial tibiofemoral and  patellofemoral joint space narrowing with marginal osteophytes. Small suprapatellar joint effusion. IMPRESSION: 1. No evidence of fracture or dislocation. 2. Mild knee osteoarthritis prominent in the medial tibiofemoral and patellofemoral compartment with small suprapatellar joint effusion. Electronically Signed   By: Keane Police D.O.   On: 12/29/2021 16:22  ? ?DG Hip Unilat W or Wo Pelvis 2-3 Views Right ? ?Result Date: 12/29/2021 ?CLINICAL DATA:  Pain, previous fall. EXAM: DG HIP (WITH OR WITHOUT PELVIS) 2-3V RIGHT COMPARISON:  None. FINDINGS: There is no evidence of hip fracture or dislocation. There is no evidence of arthropathy or other focal bone abnormality. IMPRESSION: Negative. Electronically Signed   By: Keane Police D.O.   On: 12/29/2021 16:20  ? ?VAS [place] LOWER EXTREMITY VENOUS (DVT) ? ?Result Date: 12/29/2021 ? Lower Venous DVT Study Patient Name:  Heidi Davis  Date of Exam:   12/29/2021 Medical Rec #: [id number]           Accession #:    [id number] Date of Birth: 10/16/1960            Patient Gender: F Patient Age:   57 years Exam Location:  [facility] Procedure:      VAS [place] LOWER EXTREMITY VENOUS (DVT) Referring Phys: Beverley Fiedler SOTO --------------------------------------------------------------------------------  Indications: Swelling.  Comparison Study: No previous exams Performing Technologist: Jody Hill RVT, RDMS  Examination Guidelines: A complete evaluation includes B-mode imaging, spectral Doppler, color Doppler, and power Doppler as needed of all accessible portions of each vessel. Bilateral testing is considered an integral part of a complete examination. Limited examinations for reoccurring indications may be performed as noted. The reflux portion of the exam is performed with the patient in reverse Trendelenburg.  +---------+---------------+---------+-----------+----------+-------------------+ RIGHT    CompressibilityPhasicitySpontaneityPropertiesThrombus Aging       +---------+---------------+---------+-----------+----------+-------------------+ CFV      Full           Yes      Yes                                      +---------+---------------+---------+-----------+----------+-------------------+ SFJ      Full                                                             +---------+---------------+---------+-----------+----------+-------------------+ FV Prox  Full           Yes  Yes                                      +---------+---------------+---------+-----------+----------+-------------------+ FV Mid   Full           Yes      Yes                                      +---------+---------------+---------+-----------+----------+-------------------+ FV DistalFull           Yes      Yes                                      +---------+---------------+---------+-----------+----------+-------------------+ PFV      Full                                                             +---------+---------------+---------+-----------+----------+-------------------+ POP      Full           Yes      Yes                                      +---------+---------------+---------+-----------+----------+-------------------+ PTV      Full                                                             +---------+---------------+---------+-----------+----------+-------------------+ PERO                                                  Not well visualized +---------+---------------+---------+-----------+----------+-------------------+   +---------+---------------+---------+-----------+----------+-------------------+ LEFT     CompressibilityPhasicitySpontaneityPropertiesThrombus Aging      +---------+---------------+---------+-----------+----------+-------------------+ CFV      Full           Yes      Yes                                      +---------+---------------+---------+-----------+----------+-------------------+  SFJ      Full                                                             +---------+---------------+---------+-----------+----------+-------------------+ FV Prox  Full           Yes      Yes                                      +---------+---------------+---------+-----------+----------+----------------

## 2022-08-30 ENCOUNTER — Ambulatory Visit: Payer: No Typology Code available for payment source | Admitting: Family Medicine

## 2022-09-02 ENCOUNTER — Encounter: Payer: Self-pay | Admitting: *Deleted

## 2022-09-13 ENCOUNTER — Emergency Department (HOSPITAL_BASED_OUTPATIENT_CLINIC_OR_DEPARTMENT_OTHER)
Admission: EM | Admit: 2022-09-13 | Discharge: 2022-09-13 | Disposition: A | Discharge status: Home / Self Care | Payer: Medicaid Other | Attending: Emergency Medicine | Admitting: Emergency Medicine

## 2022-09-13 ENCOUNTER — Other Ambulatory Visit: Payer: Self-pay

## 2022-09-13 ENCOUNTER — Encounter (HOSPITAL_BASED_OUTPATIENT_CLINIC_OR_DEPARTMENT_OTHER): Payer: Self-pay

## 2022-09-13 DIAGNOSIS — Z20822 Contact with and (suspected) exposure to covid-19: Secondary | ICD-10-CM | POA: Insufficient documentation

## 2022-09-13 DIAGNOSIS — Z7984 Long term (current) use of oral hypoglycemic drugs: Secondary | ICD-10-CM | POA: Insufficient documentation

## 2022-09-13 DIAGNOSIS — J101 Influenza due to other identified influenza virus with other respiratory manifestations: Secondary | ICD-10-CM | POA: Insufficient documentation

## 2022-09-13 DIAGNOSIS — E119 Type 2 diabetes mellitus without complications: Secondary | ICD-10-CM | POA: Insufficient documentation

## 2022-09-13 DIAGNOSIS — J111 Influenza due to unidentified influenza virus with other respiratory manifestations: Secondary | ICD-10-CM

## 2022-09-13 DIAGNOSIS — R6889 Other general symptoms and signs: Secondary | ICD-10-CM | POA: Diagnosis present

## 2022-09-13 LAB — RESP PANEL BY RT-PCR (RSV, FLU A&B, COVID)  RVPGX2
Influenza A by PCR: POSITIVE — AB
Influenza B by PCR: NEGATIVE
Resp Syncytial Virus by PCR: NEGATIVE
SARS Coronavirus 2 by RT PCR: NEGATIVE

## 2022-09-13 NOTE — ED Triage Notes (Signed)
Patient arrives with daughter c/o flu-like sxs that started on Saturday. Pt reports feeling feverish, having a cough, body aches/fatigue, and headache. Pt states her grandchildren have been around her and were diagnosed with the flu.

## 2022-09-13 NOTE — ED Provider Notes (Signed)
[facility] EMERGENCY DEPARTMENT Provider Note   CSN: [id number] Arrival date & time: 09/13/22  1855     History  Chief Complaint  Patient presents with   flu-like symptoms    Heidi Davis is a 61 y.o. female.  Patient here with flulike symptoms.  History of diabetes.  Nothing makes it worse or better.  Symptoms for the last 3 days.  No sick contacts.  Denies any nausea vomiting diarrhea.  The history is provided by the patient.       Home Medications Prior to Admission medications   Medication Sig Start Date End Date Taking? Authorizing Provider  atenolol-chlorthalidone (TENORETIC) 50-25 MG per tablet Take 1 tablet by mouth daily.    [provider]  azithromycin (ZITHROMAX) 250 MG tablet Take 1 tablet (250 mg total) by mouth daily. Take first 2 tablets together, then 1 every day until finished. 01/04/16   Blanchie Dessert, MD  HYDROcodone-acetaminophen (NORCO/VICODIN) 5-325 MG tablet Take 1 tablet by mouth every 4 (four) hours as needed. 05/24/17   Isla Pence, MD  HYDROcodone-homatropine [facility]) 5-1.5 MG/5ML syrup Take 5 mLs by mouth every 6 (six) hours as needed for cough. 01/04/16   Blanchie Dessert, MD  metFORMIN (GLUCOPHAGE) 500 MG tablet Take 500 mg by mouth 2 (two) times daily with a meal.    [provider]  metFORMIN (GLUCOPHAGE) 500 MG tablet Take 1 tablet (500 mg total) by mouth 2 (two) times daily with a meal. 10/04/12 10/04/13  Davonna Belling, MD  omeprazole (PRILOSEC) 20 MG capsule Take 1 capsule (20 mg total) by mouth daily. 11/05/14   Carmin Muskrat, MD  potassium chloride SA (K-DUR,KLOR-CON) 20 MEQ tablet Take 1 tablet (20 mEq total) by mouth 2 (two) times daily. 05/17/14   Pattricia Boss, MD      Allergies    Patient has no known allergies.    Review of Systems   Review of Systems  Physical Exam Updated Vital Signs BP (!) 164/77 (BP Location: Left Arm)   Pulse 80   Temp 99.1 F (37.3 C) (Oral)   Resp 17   Ht  '5\' 1"'$  (1.549 m)   Wt 91.9 kg   SpO2 99%   BMI 38.30 kg/m  Physical Exam Vitals and nursing note reviewed.  Constitutional:      General: She is not in acute distress.    Appearance: She is well-developed.  HENT:     Head: Normocephalic and atraumatic.  Eyes:     Conjunctiva/sclera: Conjunctivae normal.  Cardiovascular:     Rate and Rhythm: Normal rate and regular rhythm.     Heart sounds: No murmur heard. Pulmonary:     Effort: Pulmonary effort is normal. No respiratory distress.     Breath sounds: Normal breath sounds.  Abdominal:     Palpations: Abdomen is soft.     Tenderness: There is no abdominal tenderness.  Musculoskeletal:        General: No swelling.     Cervical back: Neck supple.  Skin:    General: Skin is warm and dry.     Capillary Refill: Capillary refill takes less than 2 seconds.  Neurological:     Mental Status: She is alert.  Psychiatric:        Mood and Affect: Mood normal.     ED Results / Procedures / Treatments   Labs (all labs ordered are listed, but only abnormal results are displayed) Labs Reviewed  RESP PANEL BY RT-PCR (RSV, FLU A&B, COVID)  RVPGX2 - Abnormal; Notable for the following components:      Result Value   Influenza A by PCR POSITIVE (*)    All other components within normal limits    EKG None  Radiology No results found.  Procedures Procedures    Medications Ordered in ED Medications - No data to display  ED Course/ Medical Decision Making/ A&P                           Medical Decision Making  Heidi Davis is here with flulike symptoms.  Very well-appearing.  History of diabetes.  Positive for influenza A.  No hypoxia.  No concern for pneumonia.  Discharged in good condition.  This chart was dictated using voice recognition software.  Despite best efforts to proofread,  errors can occur which can change the documentation meaning.         Final Clinical Impression(s) / ED Diagnoses Final diagnoses:   Influenza    Rx / DC Orders ED Discharge Orders     None         Lennice Sites, DO 09/13/22 2102

## 2022-11-12 ENCOUNTER — Ambulatory Visit (INDEPENDENT_AMBULATORY_CARE_PROVIDER_SITE_OTHER): Payer: No Typology Code available for payment source | Admitting: Family Medicine

## 2022-11-12 ENCOUNTER — Encounter: Payer: Self-pay | Admitting: Family Medicine

## 2022-11-12 ENCOUNTER — Encounter: Payer: Self-pay | Admitting: *Deleted

## 2022-11-12 VITALS — BP 120/72 | HR 64 | Temp 98.1°F | Ht 62.99 in | Wt 205.1 lb

## 2022-11-12 DIAGNOSIS — I10 Essential (primary) hypertension: Secondary | ICD-10-CM | POA: Diagnosis not present

## 2022-11-12 DIAGNOSIS — R1010 Upper abdominal pain, unspecified: Secondary | ICD-10-CM

## 2022-11-12 DIAGNOSIS — K582 Mixed irritable bowel syndrome: Secondary | ICD-10-CM

## 2022-11-12 DIAGNOSIS — E119 Type 2 diabetes mellitus without complications: Secondary | ICD-10-CM

## 2022-11-12 DIAGNOSIS — R202 Paresthesia of skin: Secondary | ICD-10-CM | POA: Diagnosis not present

## 2022-11-12 DIAGNOSIS — M545 Low back pain, unspecified: Secondary | ICD-10-CM

## 2022-11-12 DIAGNOSIS — G8929 Other chronic pain: Secondary | ICD-10-CM

## 2022-11-12 NOTE — Patient Instructions (Addendum)
Welcome to [facility]! It was a pleasure meeting you today.  As discussed, Please schedule a 3 month follow up visit today.  [facility] at [place] on the 1st floor Phone number [phone]   Ordering ultrasound of abdomen  Get new blood pressure cuff  See if diarrhea related more to days takes metformin twice/day  PLEASE NOTE:  If you had any LAB tests please let us know if you have not heard back within a few days. You may see your results on MyChart before we have a chance to review them but we will give you a call once they are reviewed by [place]. If we ordered any REFERRALS today, please let us know if you have not heard from their office within the next week.  Let us know through MyChart if you are needing REFILLS, or have your pharmacy send [place] the request. You can also use MyChart to communicate with me or any office staff.  Please try these tips to maintain a healthy lifestyle:  Eat most of your calories during the day when you are active. Eliminate processed foods including packaged sweets (pies, cakes, cookies), reduce intake of potatoes, white bread, white pasta, and white rice. Look for whole grain options, oat flour or almond flour.  Each meal should contain half fruits/vegetables, one quarter protein, and one quarter carbs (no bigger than a computer mouse).  Cut down on sweet beverages. This includes juice, soda, and sweet tea. Also watch fruit intake, though this is a healthier sweet option, it still contains natural sugar! Limit to 3 servings daily.  Drink at least 1 glass of water with each meal and aim for at least 8 glasses per day  Exercise at least 150 minutes every week.
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
## 2022-11-13 LAB — CBC WITH DIFFERENTIAL/PLATELET
Absolute Monocytes: 460 cells/uL (ref 200–950)
Basophils Absolute: 81 cells/uL (ref 0–200)
Basophils Relative: 0.7 %
Eosinophils Absolute: 575 cells/uL — ABNORMAL HIGH (ref 15–500)
Eosinophils Relative: 5 %
HCT: 39.4 % (ref 35.0–45.0)
Hemoglobin: 13.4 g/dL (ref 11.7–15.5)
Lymphs Abs: 5532 cells/uL — ABNORMAL HIGH (ref 850–3900)
MCH: 28.5 pg (ref 27.0–33.0)
MCHC: 34 g/dL (ref 32.0–36.0)
MCV: 83.7 fL (ref 80.0–100.0)
MPV: 11.1 fL (ref 7.5–12.5)
Monocytes Relative: 4 %
Neutro Abs: 4853 cells/uL (ref 1500–7800)
Neutrophils Relative %: 42.2 %
Platelets: 257 10*3/uL (ref 140–400)
RBC: 4.71 10*6/uL (ref 3.80–5.10)
RDW: 13.6 % (ref 11.0–15.0)
Total Lymphocyte: 48.1 %
WBC: 11.5 10*3/uL — ABNORMAL HIGH (ref 3.8–10.8)

## 2022-11-13 LAB — COMPREHENSIVE METABOLIC PANEL
AG Ratio: 1.3 (calc) (ref 1.0–2.5)
ALT: 17 U/L (ref 6–29)
AST: 18 U/L (ref 10–35)
Albumin: 4.4 g/dL (ref 3.6–5.1)
Alkaline phosphatase (APISO): 101 U/L (ref 37–153)
BUN: 10 mg/dL (ref 7–25)
CO2: 26 mmol/L (ref 20–32)
Calcium: 10.9 mg/dL — ABNORMAL HIGH (ref 8.6–10.4)
Chloride: 105 mmol/L (ref 98–110)
Creat: 0.61 mg/dL (ref 0.50–1.05)
Globulin: 3.5 g/dL (calc) (ref 1.9–3.7)
Glucose, Bld: 124 mg/dL — ABNORMAL HIGH (ref 65–99)
Potassium: 4.4 mmol/L (ref 3.5–5.3)
Sodium: 142 mmol/L (ref 135–146)
Total Bilirubin: 0.5 mg/dL (ref 0.2–1.2)
Total Protein: 7.9 g/dL (ref 6.1–8.1)

## 2022-11-13 LAB — LIPASE: Lipase: 26 U/L (ref 7–60)

## 2022-11-13 LAB — URINALYSIS, ROUTINE W REFLEX MICROSCOPIC
Bilirubin Urine: NEGATIVE
Glucose, UA: NEGATIVE
Hgb urine dipstick: NEGATIVE
Ketones, ur: NEGATIVE
Leukocytes,Ua: NEGATIVE
Nitrite: NEGATIVE
Protein, ur: NEGATIVE
Specific Gravity, Urine: 1.012 (ref 1.001–1.035)
pH: 5.5 (ref 5.0–8.0)

## 2022-11-13 LAB — LIPID PANEL
Cholesterol: 214 mg/dL — ABNORMAL HIGH (ref <200)
HDL: 50 mg/dL (ref >=50)
LDL Cholesterol (Calc): 133 mg/dL (calc) — ABNORMAL HIGH
Non-HDL Cholesterol (Calc): 164 mg/dL (calc) — ABNORMAL HIGH (ref <130)
Total CHOL/HDL Ratio: 4.3 (calc) (ref <5.0)
Triglycerides: 172 mg/dL — ABNORMAL HIGH (ref <150)

## 2022-11-13 LAB — MICROALBUMIN / CREATININE URINE RATIO
Creatinine, Urine: 83 mg/dL (ref 20–275)
Microalb Creat Ratio: 29 mcg/mg creat (ref <30)
Microalb, Ur: 2.4 mg/dL

## 2022-11-13 LAB — HEMOGLOBIN A1C
Hgb A1c MFr Bld: 8.4 % of total Hgb — ABNORMAL HIGH (ref <5.7)
Mean Plasma Glucose: 194 mg/dL
eAG (mmol/L): 10.8 mmol/L

## 2022-11-13 LAB — TSH: TSH: 0.97 mIU/L (ref 0.40–4.50)

## 2022-11-13 LAB — VITAMIN B12: Vitamin B-12: >2000 pg/mL — ABNORMAL HIGH (ref 200–1100)

## 2022-11-13 LAB — AMYLASE: Amylase: 47 U/L (ref 21–101)

## 2022-11-14 DIAGNOSIS — G8929 Other chronic pain: Secondary | ICD-10-CM | POA: Insufficient documentation

## 2022-11-14 DIAGNOSIS — K582 Mixed irritable bowel syndrome: Secondary | ICD-10-CM | POA: Insufficient documentation

## 2022-11-14 DIAGNOSIS — R202 Paresthesia of skin: Secondary | ICD-10-CM | POA: Insufficient documentation

## 2022-11-14 NOTE — Progress Notes (Signed)
Labs are okay except: 1.  Diabetes is out of control-as discussed at visit, she needs to take the metformin 1000 mg twice daily consistently.  Needs to continue working on diet/exercise.  We may also need to add other medicine, but if opposed, willing to start with above.  This will need to be sent to the pharmacy. 2.  Cholesterol is too high-need to add atorvastatin 20 mg daily #90/1.  We will repeat labs at follow-up in 3 months.

## 2022-11-16 ENCOUNTER — Other Ambulatory Visit: Payer: Self-pay | Admitting: *Deleted

## 2022-11-22 ENCOUNTER — Telehealth: Payer: Self-pay | Admitting: Family Medicine

## 2022-11-22 ENCOUNTER — Ambulatory Visit (INDEPENDENT_AMBULATORY_CARE_PROVIDER_SITE_OTHER): Payer: No Typology Code available for payment source

## 2022-11-22 ENCOUNTER — Encounter: Payer: Self-pay | Admitting: Family Medicine

## 2022-11-22 ENCOUNTER — Other Ambulatory Visit: Payer: Self-pay | Admitting: *Deleted

## 2022-11-22 ENCOUNTER — Ambulatory Visit (INDEPENDENT_AMBULATORY_CARE_PROVIDER_SITE_OTHER): Payer: Medicaid Other | Admitting: Family Medicine

## 2022-11-22 VITALS — BP 130/64 | HR 64 | Ht 63.0 in | Wt 202.0 lb

## 2022-11-22 DIAGNOSIS — M5441 Lumbago with sciatica, right side: Secondary | ICD-10-CM | POA: Diagnosis not present

## 2022-11-22 DIAGNOSIS — M5442 Lumbago with sciatica, left side: Secondary | ICD-10-CM | POA: Diagnosis not present

## 2022-11-22 DIAGNOSIS — G8929 Other chronic pain: Secondary | ICD-10-CM

## 2022-11-22 DIAGNOSIS — E7800 Pure hypercholesterolemia, unspecified: Secondary | ICD-10-CM

## 2022-11-22 DIAGNOSIS — M545 Low back pain, unspecified: Secondary | ICD-10-CM

## 2022-11-22 MED ORDER — ATORVASTATIN CALCIUM 20 MG PO TABS: 20.0000 mg | ORAL_TABLET | Freq: Every day | ORAL | 1 refills | Status: DC

## 2022-11-22 MED ORDER — GABAPENTIN 100 MG PO CAPS: 100.0000 mg | ORAL_CAPSULE | Freq: Every evening | ORAL | 3 refills | Status: DC | PRN

## 2022-11-22 NOTE — Progress Notes (Signed)
   I, Josepha Pigg, CMA acting as a Education administrator for Lynne Leader, MD.  Subjective:    CC: Back pain  HPI: Patient is a 62 year old female presenting with back pain x years, worsening more recently.  Patient locates pain to midline lower back pain with occasional radicular sx into bilat LE. C/O n/t in right leg and left foot. Some weakness in the legs when standing. Minimal relief with topical muscle rub.   Pain radiates to both legs.  The distribution is into the lateral calf and dorsal feet bilaterally.  Pain is worse with standing.  Pain is also obnoxious with sleep and interfering with sleep..  Radiating pain: yes LE numbness/tingling: yes LE weakness: yes Aggravates: laying down Treatments tried: OTC muscle rub  Dx imaging: 12/29/2021 R hip x-ray  06/06/19 L-spine x-ray  Pertinent review of Systems: No fevers or chills  Relevant historical information: Hypertension.  Diabetes.   Objective:    Vitals:   11/22/22 1300  BP: 130/64  Pulse: 64  SpO2: 98%   General: Well Developed, well nourished, and in no acute distress.   MSK: L-spine normal appearing Nontender palpation lumbar midline.  Palpation paraspinal musculature. Decreased lumbar motion. Lower extremity strength is intact. Reflexes are intact  Lab and Radiology Results  X-ray images lumbar spine obtained today personally and independently interpreted Mild multilevel DDD.  Facet DJD L5-S1 and L4-L5. Await formal radiology review   Impression and Recommendations:    Assessment and Plan: 62 y.o. female with chronic low back pain with bilateral lumbar radiculopathy at L5.  This is a chronic ongoing issue that has been worsening recently.  Plan for trial of physical therapy.  Additionally we will proceed to trial of gabapentin mostly at bedtime.  Recheck in 6 weeks.  If not improved would consider MRI lumbar spine.Marland Kitchen  PDMP not reviewed this encounter. Orders Placed This Encounter  Procedures   DG Lumbar Spine  2-3 Views    Standing Status:   Future    Number of Occurrences:   1    Standing Expiration Date:   12/23/2022    Order Specific Question:   Reason for Exam (SYMPTOM  OR DIAGNOSIS REQUIRED)    Answer:   mid-line lower back pain    Order Specific Question:   Preferred imaging location?    Answer:   [facility]   Ambulatory referral to Physical Therapy    Referral Priority:   Routine    Referral Type:   Physical Medicine    Referral Reason:   Specialty Services Required    Requested Specialty:   Physical Therapy   Meds ordered this encounter  Medications   gabapentin (NEURONTIN) 100 MG capsule    Sig: Take 1-3 capsules (100-300 mg total) by mouth at bedtime as needed (nerve pain).    Dispense:  90 capsule    Refill:  3    Discussed warning signs or symptoms. Please see discharge instructions. Patient expresses understanding.   The above documentation has been reviewed and is accurate and complete Lynne Leader, M.D.

## 2022-11-22 NOTE — Patient Instructions (Addendum)
Thank you for coming in today.   Please get an Xray today before you leave   I've referred you to Physical Therapy.  Let us know if you don't hear from them in one week.   Try gabapentin at bedtime.   Recheck in 6 weeks.   Try a heating pad.   Try a TENS unit.   TENS UNIT: This is helpful for muscle pain and spasm.   Search and Purchase a TENS 7000 2nd edition at  [email or web address] or [email or web address] It should be less than $30.     TENS unit instructions: Do not shower or bathe with the unit on Turn the unit off before removing electrodes or batteries If the electrodes lose stickiness add a drop of water to the electrodes after they are disconnected from the unit and place on plastic sheet. If you continued to have difficulty, call the TENS unit company to purchase more electrodes. Do not apply lotion on the skin area prior to use. Make sure the skin is clean and dry as this will help prolong the life of the electrodes. After use, always check skin for unusual red areas, rash or other skin difficulties. If there are any skin problems, does not apply electrodes to the same area. Never remove the electrodes from the unit by pulling the wires. Do not use the TENS unit or electrodes other than as directed. Do not change electrode placement without consultating your therapist or physician. Keep 2 fingers with between each electrode. Wear time ratio is 2:1, on to off times.    For example on for 30 minutes off for 15 minutes and then on for 30 minutes off for 15 minutes   ???? ?????? ?????? Spinal Stenosis  ???? ?????? ?????? ????? ???? ???? ??? ???? ?????? ???????. ??? ?????? ??????? ??? ??????? ??? ???? ?????? ?????? (???????). ????? ??? ????? ??? ????? ?????? ???????? ???? ???? ?? ?????? ?????? ??? ???????? ?? ???????. ?????? ????? ??????? ??????? ?? ?????? ?????? ?????? ??? ???? ??? ????? ?? ??????? ?? ????? ?? ???????? ?? ???????. ????? ?? ???? ???? ?????? ?????? ??? ??????? ??  ?????? ?????? ?????? ?? ?????? ????? ?????. ??????? ???? ?????? ?????? ?????? ?? ??? ??????? ????????. ?? ????? ??? ??????? ???? ??? ?????? ???? ??? ????? ?? ?????? ??? ?????? ??????? ???????. ??? ????? ???? ??????? ??? ??????? (?????? ?????)? ?? ?? ???? ????? ??? ???:  ?????? ??????? ???? (?????? ?????? ??????). ????? ??? ???? ??? ?? 50 ?60 ?????.  ????? (???) ?? ?????? ??????.  ?????? ?? ??? ?????? ?? ?????? ??????.  ????? ?? ??????? ??????.  ?????? ????????? ?? ??????? ??????. ?? ????? ????? ?????? ???? ????? ??????? ??????? ?? ?????? ???????? ???? ??????:  ???? ??? 50 ?????.  ??????? ????? ???? ???? ??? ????? (???? ?????? ????????????)? ??? ?????.  ??????? ??????? ???????. ?? ?????? ??? ?????? ?? ????????  ???? ????? ??? ?????? ?? ???:  ??? ?? ?????? ?? ????? ???? ???? ?? ?????? ???????? ????? ??? ?????? ?? ?????.  ??????? ?? ????? ?? ??????? ???????? ?? ??????? ?? ????? ?? ????? (???????) ?? ???????? ?? ???????. ????? ?? ???? ??? ?? ??? ????? ?? ???? ???? ?? ?? ??????? ?? ????????.  ??? ?? ???????? ????? ?????? ??? ?????? (??? ?????). ????? ?? ???? ??? ?? ??? ??????? ?? ??????.  ????? ??????.  ?????? ?????. ????? ??? ????? ?? ??? ????? ??????? ?? ???? ??? ???? ????? ??? ????? ??? ?????. ???? ?????? ???? ?? ???? ???????. ?? ??????? ?????? ?????? ?? ????? ??????? ???? ???:  ?????? ?? ??????? ?? ??????.  ????? ?? ?????? ?????.  ????? ???????? ?? ??????? ???? ?????? ??? ?????. ?? ???? ???????? ???? ?? ????? ????? ?? ?????. ?? ??? ???????? ?? ?? ???? ????? ???????. ??? ????? ??? ??????? ????? ??? ?????? ????????? ??? ??????? ?????? ????? ?????? ??????. ??? ???? ????????? ??????? ??? ?????? ??????? ?? ?????? ???????? ??????????? (  CT) ?? ??????? ??????? ?????????? (MRI). ??? ?????? ??? ??????? ???? ???? ??? ?????? ??? ?????? ?? ????? ???????? ??????. ????? ???? ??????:  ?????? ??? ?????? ?????? ????? ?????? ????? ??? ???????.  ?????? ???????? ?????? ??????? ??????? ?????? ??? ??????  ?????? ?????? ??? ??????? ??????? ??? ???? ???? ???????. ??? ??? ??? ?????? ??????? ???????? ?????? ?????? ??? ????.  ????? ?????? ??? ???????.  ????? ?????? ???????? ???????. ?? ???? ??? ???? ???? ???? ?? ?????? ?????? (????????????).  ????????? ???????? ???????? ??? ????? ?? ?????? ??????. ?? ???? ????? ????? ?? ??? ???????. ???????? ??????? ??????? ???? ????????? ??????. ??? ??????? ????? ?????? ???? ???? ??? ???? ???????. ???? ????????? ??????? ?? ??????: ??????? ??? ????? ??????? ???????   ?????? ??? ?????? ????? ???????. ??? ?? ?????? ????? ?????? ??? ????? ???? ?? ???? ????????? ??? ??????? ???? ??????? ?????? ????.  ???? ??? ???? ?? ?????? ??????. ?????? ?? ???? ??????? ?????? ??? ??? ?? ???? ??? ???????? ??? ??? ?????.  ??? ??????? ??? ??????? ??????? ??? ??? ?????? ???? ????? ?? ???? ??????? ??????? ??? ????? ??????? ????. ?????? ???? ??????? ???? ????? ?? ????? ??????? ?????? ??? ?????? ??????? ?? ????? ???????. ? ???? ?? ????? ??? ????? ????? ???????. ? ???? ???? ??????? ??? ?????? ?? ??? 20-30 ?????. ? ??? ???? ??? ????? ??? ????? ?????? ??????? ????? ???? ??????? ????? ??? ?? ????? ?????. ?????? ??? ??????? ??????? ??? ??? ?? ?????? ?????? ?????? ?? ??????? ?? ???????. ?????? ?????? ??? ?? ???? ??????? ??????.  ?? ???? ????? ???? ?? ?????.  ?? ????? ??? ???? ??? ???????. ?????? ??????? ??????? ?????? ?????? ????? ???? ????? ???? ?????.  ???? ?????? ?????? ??????? ??? ??????? ??????? ??????? ??????. ?????? ???? ??????? ?????? ???? ??????? ?????? ??. ??????? ????  ????? ??????? ???? ????? ????? ???? ?? ??? ???? ???? ??? ?? ????? ?? ???? ??????? ??????.  ?? ?????? ??? ?????? ?????? ????? ??? ????????? ?? ????? ???????. ????? ??? ??????? ???? ????? ?????? ??????? ???????????? ??? ??????? ???????????. ????? ???? ??????? ?????? ??? ??? ????? ??? ???????? ??????? ?? ???????.  ???? ???? ????? ???. ???? ???? ????? ?????? ?? ??????? ?????????? ??????? ??????? ??????????? ????? ?????? (???  ???). ????? ?????? ??? ?????? ?? ?????????:  ?????? ?????? ??????? ??????? ???????? ??????? ???????? ?????? ????? [employer] Arthritis and Musculoskeletal and Skin Diseases):? [email or web address] ???? ?????? ??????? ?????? ?? ??????? ???????:  ??? ???? ??????? ?? ????? ?????.  ?????? ????????. ???? ????????? ????? ?? ??????? ???????:  ???? ???? ?? ????? ????? ???? ???? ???: ? ??????? ???? ???? ?? ??? ?? ?????? ?? ???? ?????. ? ??????? ???? ???? ?? ???? ?????? ??? ????????. ? ??????? ????? ??? ????? ????? ??? ???????.  ?????? ???????.  ?????? ????? ?? ?????? ??? ???? ?????? ?? ????????.  ??????? ???????? ?? ?????.  ??????? ??????? ?? ????? ?? ????? ?????? ?? ??? ??? ?? ??????? ?? ?????.  ????? ?????? ?? ???? ??????? ?? ???????.  ??????? ???????? ?? ?????? ?? ???????? ?? ??? ???????? ?? ???? ???????. ??? ???? ??? ??????? ????? ??? ???? ???? ?????. ???? ???????? ?????. ???? ???? 911.  ?? ????? ?? ??? ???? ??????? ????? ?? ??.  ?? ??? ??????? ????? ??? ????????. ????  ???? ?????? ?????? ?? ??? ???? ?????? ???????? ??? ???? ??? ????? ?????? ?? ??????? ???? ???? ?? ???????.  ???? ??? ?????? ???? ??? ????? ?? ?????? ??? ?????? ??????? ???????.  ???? ?????? ?????? ???? ?? ???? ??????? ?? ????? ?? ????? ?? ??????? ?? ?????? ?? ????? ?? ???????? ?? ???????.  ????? ?? ????? ??? ?????? ???????? ??? ?????? ????? ?????? ?????? ??????? ??? ?????? ??????? ?? ?????? ???????? ??????????? (  CT) ?? ??????? ??????? ?????????? (MRI). ??? ????? ?? ??? ????????? ?? ???? ?????? ????????? ???? ?????? ???? ??????? ??????. ???? ?? ?????? ??? ????? ???? ?? ???? ?? ???? ??????? ??????.? Document Revised: 01/04/2022 Document Reviewed: 01/04/2022 Elsevier Patient Education  [place].

## 2022-11-22 NOTE — Telephone Encounter (Signed)
Left message to return call.  

## 2022-11-22 NOTE — Telephone Encounter (Signed)
Patient requests to be called re: Patient states a cholesterol medication was going to be prescribed at [facility] on 11/12/22  and sent to:  [facility] CV:[id number], [place] Phone: [phone]  Fax: [phone]     Patient states Pharmacy has not received the above RX

## 2022-11-22 NOTE — Telephone Encounter (Signed)
Cholesterol medication sent to the pharmacy as requested.

## 2022-11-24 NOTE — Progress Notes (Signed)
Lumbar spine x-ray shows arthritis changes in the low back.

## 2022-12-07 ENCOUNTER — Other Ambulatory Visit: Payer: Self-pay | Admitting: Family Medicine

## 2022-12-08 ENCOUNTER — Other Ambulatory Visit: Payer: Self-pay

## 2022-12-08 ENCOUNTER — Ambulatory Visit: Payer: Medicaid Other | Attending: Family Medicine | Admitting: Physical Therapy

## 2022-12-08 ENCOUNTER — Other Ambulatory Visit: Payer: Self-pay | Admitting: Family Medicine

## 2022-12-08 DIAGNOSIS — M5441 Lumbago with sciatica, right side: Secondary | ICD-10-CM | POA: Diagnosis not present

## 2022-12-08 DIAGNOSIS — G8929 Other chronic pain: Secondary | ICD-10-CM | POA: Diagnosis not present

## 2022-12-08 DIAGNOSIS — M6281 Muscle weakness (generalized): Secondary | ICD-10-CM | POA: Diagnosis present

## 2022-12-08 DIAGNOSIS — M5459 Other low back pain: Secondary | ICD-10-CM | POA: Diagnosis not present

## 2022-12-08 DIAGNOSIS — M5442 Lumbago with sciatica, left side: Secondary | ICD-10-CM | POA: Insufficient documentation

## 2022-12-08 DIAGNOSIS — Z1231 Encounter for screening mammogram for malignant neoplasm of breast: Secondary | ICD-10-CM

## 2022-12-08 DIAGNOSIS — M545 Low back pain, unspecified: Secondary | ICD-10-CM | POA: Insufficient documentation

## 2022-12-08 MED ORDER — METFORMIN HCL 1000 MG PO TABS: 1000.0000 mg | ORAL_TABLET | Freq: Two times a day (BID) | ORAL | 1 refills | Status: DC

## 2022-12-08 NOTE — Therapy (Signed)
OUTPATIENT PHYSICAL THERAPY THORACOLUMBAR EVALUATION  Patient Name: Heidi Davis MRN: [id number] DOB:July 13, 1961, 62 y.o., female Today's Date: 12/08/2022   PT End of Session - 12/08/22 1354     Visit Number 1    Number of Visits --   1-2x/week   Date for PT Re-Evaluation 02/02/23    Authorization Type [employer] MCD - PSFS    PT Start Time 1300    PT Stop Time 1350    PT Time Calculation (min) 50 min             Past Medical History:  Diagnosis Date   Arthritis    Asthma    long times ago- 12 yrs ago. no meds   Diabetes mellitus    GERD (gastroesophageal reflux disease)    Hypertension    Past Surgical History:  Procedure Laterality Date   ABDOMINAL HYSTERECTOMY     total   COLONOSCOPY WITH PROPOFOL N/A 11/06/2015   Procedure: COLONOSCOPY WITH PROPOFOL;  Surgeon: Ronald Lobo, MD;  Location: [facility];  Service: Endoscopy;  Laterality: N/A;   Patient Active Problem List   Diagnosis Date Noted   Irritable bowel syndrome with both constipation and diarrhea 11/14/2022   Chronic midline low back pain without sciatica 11/14/2022   Paresthesia 11/14/2022   Type 2 diabetes mellitus without complication, without long-term current use of insulin ([place]) 11/12/2022   HTN (hypertension) 10/20/2013   Morbid obesity ([place]) 10/20/2013    PCP: Tawnya Crook, MD  REFERRING PROVIDER: Gregor Hams, MD  THERAPY DIAG:  Other low back pain - Plan: PT plan of care cert/re-cert  Muscle weakness - Plan: PT plan of care cert/re-cert  REFERRING DIAG: Chronic midline low back pain, unspecified whether sciatica present [M54.50, G89.29], Chronic bilateral low back pain with bilateral sciatica [M54.42, M54.41, G89.29]   Rationale for Evaluation and Treatment:  Rehabilitation  SUBJECTIVE:  PERTINENT PAST HISTORY:  DM TII        PRECAUTIONS: None  WEIGHT BEARING RESTRICTIONS No  FALLS:  Has patient fallen in last 6 months? No, Number of falls: 0  MOI/History of  condition:  Onset date: >10 years  SUBJECTIVE STATEMENT  Daughter is present and translates  Heidi Davis is a 62 y.o. female who presents to clinic with chief complaint of low back pain which started more than 10 years ago.  No acute injury or trauma.  Has slowly gotten worse over that time.  Some n/t in bil legs R>L.  She has some n/t to feet.  She endorses some weakness. No new BB changes.  Denies saddle anesthesia.      Red flags:  denies   Pain:  Are you having pain? Yes Pain location: low back R>L, bil knee pain  NPRS scale:  0/10 to 10/10 Aggravating factors: laying down, standing up, straightening up back Relieving factors: sitting Pain description: intermittent, burning, and aching Stage: Chronic Stability: getting worse 24 hour pattern: worse with activity   Occupation: NA  Assistive Device: no  Hand Dominance: NA  Patient Goals/Specific Activities: improve pain, stand longer, steps  Lumbar Spinal Stenosis Cluster Bilateral Symptoms   positive Leg pain > back pain   negative Pain during walking/standing  positive Pain relief w/ sitting   positive >48 y/o     positive  Patient Specific Functional Scale:  Activity Eval         stairs 5         walking 7  Standing from sitting 3                    Avg. 5          (Activities rated 0-10/10.  "10" represents "able to perform at prior level" while "0" represents "unable to perform.")    OBJECTIVE:   SENSATION:  Light touch: Deficits L3-S1 on R  LUMBAR AROM  AROM AROM  12/08/2022  Flexion Fingertips to toes (WNL)  Extension WNL  Right lateral flexion WNL  Left lateral flexion WNL  Right rotation WNL  Left rotation WNL    (Blank rows = not tested)   LE MMT:  MMT Right 12/08/2022 Left 12/08/2022  Hip flexion (L2, L3) 3+ 4+  Knee extension (L3) 3+ 4+  Knee flexion    Hip abduction 2+* 3+  Hip extension Partial ROM bridge  Hip external rotation    Hip internal rotation    Hip  adduction    Ankle dorsiflexion (L4) C C  Ankle plantarflexion (S1) C C  Ankle inversion    Ankle eversion    Great Toe ext (L5) C C  Grossly     (Blank rows = not tested, score listed is out of 5 possible points.  N = WNL, D = diminished, C = clear for gross weakness with myotome testing, * = concordant pain with testing)   LUMBAR SPECIAL TESTS:  Straight leg raise: L (equivocal), R (equivocal) Slump: L (-), R (-)  MUSCLE LENGTH: Hamstrings: Right subtle, w/ pain restriction; Left subtle, w/ pain restriction ASLR: Right ASLR significantly reduced compared to PSLR ; Left ASLR significantly reduced compared to PSLR  Thomas test: Right significant restriction; Left significant restriction  Functional Tests  Eval (12/08/2022)    30'' STS: 6x  UE used? Y                                                          TODAY'S TREATMENT  Creating, reviewing, and completing below HEP  PATIENT EDUCATION:  POC, diagnosis, prognosis, HEP, and outcome measures.  Pt educated via explanation, demonstration, and handout (HEP).  Pt confirms understanding verbally.   HOME EXERCISE PROGRAM: Access Code: [email or web address] URL: [email or web address] Date: 12/08/2022 Prepared by: Shearon Balo  Exercises - Supine Lower Trunk Rotation  - 1 x daily - 7 x weekly - 1 sets - 20 reps - 3 hold - Supine Hip Adduction Isometric with Ball  - 1 x daily - 7 x weekly - 2 sets - 10 reps - 10'' hold - Hooklying Isometric Clamshell  - 1 x daily - 7 x weekly - 3 sets - 10 reps - Bridge  - 1 x daily - 7 x weekly - 3 sets - 5 reps - 2'' hold  Treatment priorities   Eval (12/08/2022)        Hip ext ROM        Core/hip strength        Hip abd strength                          ASSESSMENT:  CLINICAL IMPRESSION: Heidi Davis is a 62 y.o. female who presents to clinic with signs and sxs consistent with low back pain with radicular pain.  Eval slightly complicated by translation.  Pt  has negative  slump bil with equivocal SLR bil.  Pt subjective is consistent with radiculopathy, but really unable to confirm or rule this out on exam.  No gross neurological weakness.  She does reports some sensory changes.  Stenosis test cluster is 4/5 (+)   OBJECTIVE IMPAIRMENTS: Pain, core/hip/LE strength, hip ext ROM  ACTIVITY LIMITATIONS: lifting, standing, walking, bending  PERSONAL FACTORS: See medical history and pertinent history   REHAB POTENTIAL: Good  CLINICAL DECISION MAKING: Stable/uncomplicated  EVALUATION COMPLEXITY: Low   GOALS:   SHORT TERM GOALS: Target date: 01/05/2023  Heidi Davis will be >75% HEP compliant to improve carryover between sessions and facilitate independent management of condition  Evaluation (12/08/2022): ongoing Goal status: INITIAL   LONG TERM GOALS: Target date: 02/02/2023  Heidi Davis will show a >/= 3 pt avg improvement in their PSFS as a proxy for functional improvement   Evaluation/Baseline (12/08/2022):   Patient Specific Functional Scale:  Activity Eval         stairs 5         walking 7         Standing from sitting 3                    Avg. 5          (Activities rated 0-10/10.  "10" represents "able to perform at prior level" while "0" represents "unable to perform.")   Goal status: INITIAL   2.  Heidi Davis will self report >/= 50% decrease in pain from evaluation   Evaluation/Baseline (12/08/2022): 10/10 max pain Goal status: INITIAL   3.  Heidi Davis will report confidence in self management of condition at time of discharge with advanced HEP  Evaluation/Baseline (12/08/2022): unable to self manage Goal status: INITIAL    4.  Heidi Davis will improve the following MMTs to >/= 4/5 to show improvement in strength:     Evaluation/Baseline (12/08/2022):   LE MMT:  MMT Right 12/08/2022 Left 12/08/2022  Hip flexion (L2, L3) 3+ 4+  Knee extension (L3) 3+ 4+  Knee flexion    Hip abduction 2+* 3+  Hip extension Partial ROM bridge  Hip external rotation     Hip internal rotation    Hip adduction    Ankle dorsiflexion (L4) C C  Ankle plantarflexion (S1) C C  Ankle inversion    Ankle eversion    Great Toe ext (L5) C C  Grossly     (Blank rows = not tested, score listed is out of 5 possible points.  N = WNL, D = diminished, C = clear for gross weakness with myotome testing, * = concordant pain with testing)  Goal status: INITIAL   5.  Heidi Davis will be able to maintain supine bridge for 20'' (dominant leg extended if 120'' reached) as evidence of improved hip extension and core strength (norm for healthy adult ~170'')   Evaluation/Baseline (12/08/2022): partial ROM single rep Goal status: INITIAL   PLAN: PT FREQUENCY: 1-2x/week  PT DURATION: 8 weeks (Ending 02/02/2023)  PLANNED INTERVENTIONS: Therapeutic exercises, Aquatic therapy, Therapeutic activity, Neuro Muscular re-education, Gait training, Patient/Family education, Joint mobilization, Dry Needling, Electrical stimulation, Spinal mobilization and/or manipulation, Moist heat, Taping, Vasopneumatic device, Ionotophoresis 4mg /ml Dexamethasone, and Manual therapy   Shearon Balo PT, DPT 12/08/2022, 2:54 PM

## 2022-12-17 ENCOUNTER — Ambulatory Visit
Admission: RE | Admit: 2022-12-17 | Discharge: 2022-12-17 | Disposition: A | Discharge status: Home / Self Care | Payer: Medicaid Other | Source: Ambulatory Visit | Attending: Family Medicine | Admitting: Family Medicine

## 2022-12-17 ENCOUNTER — Encounter: Payer: Self-pay | Admitting: Family Medicine

## 2022-12-17 DIAGNOSIS — R1010 Upper abdominal pain, unspecified: Secondary | ICD-10-CM

## 2022-12-17 DIAGNOSIS — N281 Cyst of kidney, acquired: Secondary | ICD-10-CM | POA: Diagnosis not present

## 2022-12-17 DIAGNOSIS — K8020 Calculus of gallbladder without cholecystitis without obstruction: Secondary | ICD-10-CM | POA: Diagnosis not present

## 2022-12-19 NOTE — Progress Notes (Signed)
Ultrasound does show some gallstones-these may or may not be causing the problem.  If she still having pain?  If so, refer to surgeon.  If not, avoid greasy fatty fried foods.  Liver does show some fatty liver-needs to work on diet/exercise and get her diabetes under control.

## 2022-12-20 ENCOUNTER — Ambulatory Visit: Payer: Medicaid Other | Admitting: Physical Therapy

## 2022-12-22 ENCOUNTER — Encounter: Payer: Self-pay | Admitting: Physical Therapy

## 2022-12-22 ENCOUNTER — Ambulatory Visit: Payer: Medicaid Other | Attending: Family Medicine | Admitting: Physical Therapy

## 2022-12-22 DIAGNOSIS — M5459 Other low back pain: Secondary | ICD-10-CM | POA: Diagnosis present

## 2022-12-22 DIAGNOSIS — M6281 Muscle weakness (generalized): Secondary | ICD-10-CM

## 2022-12-22 NOTE — Therapy (Signed)
OUTPATIENT PHYSICAL THERAPY THORACOLUMBAR TREATMENT  Patient Name: Heidi Davis MRN: [id number] DOB:December 28, 1960, 62 y.o., female Today's Date: 12/22/2022   PT End of Session - 12/22/22 1506     Visit Number 2    Number of Visits --   1-2x/week   Date for PT Re-Evaluation 02/02/23    Authorization Type [employer] MCD - PSFS    PT Start Time [id number]   late arrival   PT Stop Time 1545    PT Time Calculation (min) 39 min    Activity Tolerance Patient tolerated treatment well              Past Medical History:  Diagnosis Date   Arthritis    Asthma    long times ago- 12 yrs ago. no meds   Diabetes mellitus    GERD (gastroesophageal reflux disease)    Hypertension    Past Surgical History:  Procedure Laterality Date   ABDOMINAL HYSTERECTOMY     total   COLONOSCOPY WITH PROPOFOL N/A 11/06/2015   Procedure: COLONOSCOPY WITH PROPOFOL;  Surgeon: Ronald Lobo, MD;  Location: [facility];  Service: Endoscopy;  Laterality: N/A;   Patient Active Problem List   Diagnosis Date Noted   Irritable bowel syndrome with both constipation and diarrhea 11/14/2022   Chronic midline low back pain without sciatica 11/14/2022   Paresthesia 11/14/2022   Type 2 diabetes mellitus without complication, without long-term current use of insulin 11/12/2022   HTN (hypertension) 10/20/2013   Morbid obesity 10/20/2013    PCP: Tawnya Crook, MD  REFERRING PROVIDER: Tawnya Crook, MD  THERAPY DIAG:  Other low back pain  Muscle weakness  REFERRING DIAG: Chronic midline low back pain, unspecified whether sciatica present [M54.50, G89.29], Chronic bilateral low back pain with bilateral sciatica [M54.42, M54.41, G89.29]   Rationale for Evaluation and Treatment:  Rehabilitation  SUBJECTIVE:  PERTINENT PAST HISTORY:  DM TII        PRECAUTIONS: None  WEIGHT BEARING RESTRICTIONS No  FALLS:  Has patient fallen in last 6 months? No, Number of falls: 0  MOI/History of condition:  Onset  date: >10 years  SUBJECTIVE STATEMENT Daughter is present and translates Pt states she did the exercises a little bit. Pt states the exercises were good. Pt reports her back is hurting this afternoon.   From eval: Heidi Davis is a 62 y.o. female who presents to clinic with chief complaint of low back pain which started more than 10 years ago.  No acute injury or trauma.  Has slowly gotten worse over that time.  Some n/t in bil legs R>L.  She has some n/t to feet.  She endorses some weakness. No new BB changes.  Denies saddle anesthesia.    Pain:  Are you having pain? Yes Pain location: low back R>L, bil knee pain  NPRS scale:  9/10 Aggravating factors: laying down, standing up, straightening up back Relieving factors: sitting Pain description: intermittent, burning, and aching Stage: Chronic Stability: getting worse 24 hour pattern: worse with activity   Occupation: NA  Patient Goals/Specific Activities: improve pain, stand longer, steps  Lumbar Spinal Stenosis Cluster Bilateral Symptoms   positive Leg pain > back pain   negative Pain during walking/standing  positive Pain relief w/ sitting   positive >48 y/o     positive  Patient Specific Functional Scale:  Activity Eval         stairs 5         walking 7  Standing from sitting 3                    Avg. 5          (Activities rated 0-10/10.  "10" represents "able to perform at prior level" while "0" represents "unable to perform.")    OBJECTIVE:   SENSATION:  Light touch: Deficits L3-S1 on R  LUMBAR AROM  AROM AROM  12/08/2022  Flexion Fingertips to toes (WNL)  Extension WNL  Right lateral flexion WNL  Left lateral flexion WNL  Right rotation WNL  Left rotation WNL    (Blank rows = not tested)   LE MMT:  MMT Right 12/08/2022 Left 12/08/2022  Hip flexion (L2, L3) 3+ 4+  Knee extension (L3) 3+ 4+  Knee flexion    Hip abduction 2+* 3+  Hip extension Partial ROM bridge  Hip external  rotation    Hip internal rotation    Hip adduction    Ankle dorsiflexion (L4) C C  Ankle plantarflexion (S1) C C  Ankle inversion    Ankle eversion    Great Toe ext (L5) C C  Grossly     (Blank rows = not tested, score listed is out of 5 possible points.  N = WNL, D = diminished, C = clear for gross weakness with myotome testing, * = concordant pain with testing)   LUMBAR SPECIAL TESTS:  Straight leg raise: L (equivocal), R (equivocal) Slump: L (-), R (-)  MUSCLE LENGTH: Hamstrings: Right subtle, w/ pain restriction; Left subtle, w/ pain restriction ASLR: Right ASLR significantly reduced compared to PSLR ; Left ASLR significantly reduced compared to PSLR  Thomas test: Right significant restriction; Left significant restriction  Functional Tests  Eval (12/08/22)    30'' STS: 6x  UE used? Y                                                          TODAY'S TREATMENT 12/22/22 Therapeutic Exercise - Nustep L5 x 7 min UEs/LEs - LTR 2x30 sec - SKTC x30 sec - Figure 4 stretch x 30 sec - PPT 10x 5 sec - PPT + clamshell green TB 10 x 5 sec - PPT + ball squeeze 10 x 5 sec - Bridge 2x10 - SLR 2x10   PATIENT EDUCATION:  POC, diagnosis, prognosis, HEP, and outcome measures.  Pt educated via explanation, demonstration, and handout (HEP).  Pt confirms understanding verbally.   HOME EXERCISE PROGRAM: Access Code: [email or web address] URL: [email or web address] Date: 12/08/2022 Prepared by: Shearon Balo  Exercises - Supine Lower Trunk Rotation  - 1 x daily - 7 x weekly - 1 sets - 20 reps - 3 hold - Supine Hip Adduction Isometric with Ball  - 1 x daily - 7 x weekly - 2 sets - 10 reps - 10'' hold - Hooklying Isometric Clamshell  - 1 x daily - 7 x weekly - 3 sets - 10 reps - Bridge  - 1 x daily - 7 x weekly - 3 sets - 5 reps - 2'' hold  Treatment priorities   Eval (12/08/2022)        Hip ext ROM        Core/hip strength        Hip abd strength  ASSESSMENT:  CLINICAL IMPRESSION: Heidi Davis tolerated session well. Fatigues with multiple sets. Reviewed HEP as pt has not been consistent with them at home. Continued core and hip strengthening as well as stretching.    OBJECTIVE IMPAIRMENTS: Pain, core/hip/LE strength, hip ext ROM  ACTIVITY LIMITATIONS: lifting, standing, walking, bending  PERSONAL FACTORS: See medical history and pertinent history   REHAB POTENTIAL: Good  CLINICAL DECISION MAKING: Stable/uncomplicated  EVALUATION COMPLEXITY: Low   GOALS:   SHORT TERM GOALS: Target date: 01/05/2023  Fujiko will be >75% HEP compliant to improve carryover between sessions and facilitate independent management of condition  Evaluation (12/08/22): ongoing Goal status: INITIAL   LONG TERM GOALS: Target date: 02/02/2023  Marleny will show a >/= 3 pt avg improvement in their PSFS as a proxy for functional improvement   Evaluation/Baseline (12/08/22):   Patient Specific Functional Scale:  Activity Eval         stairs 5         walking 7         Standing from sitting 3                    Avg. 5          (Activities rated 0-10/10.  "10" represents "able to perform at prior level" while "0" represents "unable to perform.")   Goal status: INITIAL   2.  Decie will self report >/= 50% decrease in pain from evaluation   Evaluation/Baseline (12/08/2022): 10/10 max pain Goal status: INITIAL   3.  Lanette will report confidence in self management of condition at time of discharge with advanced HEP  Evaluation/Baseline (12/08/2022): unable to self manage Goal status: INITIAL    4.  Jamiyla will improve the following MMTs to >/= 4/5 to show improvement in strength:     Evaluation/Baseline (12/08/2022):   LE MMT:  MMT Right 12/08/2022 Left 12/08/2022  Hip flexion (L2, L3) 3+ 4+  Knee extension (L3) 3+ 4+  Knee flexion    Hip abduction 2+* 3+  Hip extension Partial ROM bridge  Hip external rotation    Hip internal  rotation    Hip adduction    Ankle dorsiflexion (L4) C C  Ankle plantarflexion (S1) C C  Ankle inversion    Ankle eversion    Great Toe ext (L5) C C  Grossly     (Blank rows = not tested, score listed is out of 5 possible points.  N = WNL, D = diminished, C = clear for gross weakness with myotome testing, * = concordant pain with testing)  Goal status: INITIAL   5.  Dallie will be able to maintain supine bridge for 20'' (dominant leg extended if 120'' reached) as evidence of improved hip extension and core strength (norm for healthy adult ~170'')   Evaluation/Baseline (12/08/2022): partial ROM single rep Goal status: INITIAL   PLAN: PT FREQUENCY: 1-2x/week  PT DURATION: 8 weeks (Ending 02/02/2023)  PLANNED INTERVENTIONS: Therapeutic exercises, Aquatic therapy, Therapeutic activity, Neuro Muscular re-education, Gait training, Patient/Family education, Joint mobilization, Dry Needling, Electrical stimulation, Spinal mobilization and/or manipulation, Moist heat, Taping, Vasopneumatic device, Ionotophoresis 4mg /ml Dexamethasone, and Manual therapy   Harpreet Pompey April Ma L Asim Gersten, PT 12/22/2022, 3:07 PM

## 2022-12-27 ENCOUNTER — Ambulatory Visit: Payer: Medicaid Other | Admitting: Physical Therapy

## 2022-12-27 ENCOUNTER — Encounter: Payer: Self-pay | Admitting: Physical Therapy

## 2022-12-27 DIAGNOSIS — M5459 Other low back pain: Secondary | ICD-10-CM

## 2022-12-27 DIAGNOSIS — M6281 Muscle weakness (generalized): Secondary | ICD-10-CM

## 2022-12-27 NOTE — Therapy (Signed)
OUTPATIENT PHYSICAL THERAPY THORACOLUMBAR TREATMENT  Patient Name: Heidi Davis MRN: [id number] DOB:07-Apr-1961, 62 y.o., female Today's Date: 12/27/2022   PT End of Session - 12/27/22 1503     Visit Number 3    Number of Visits --   1-2x/week   Date for PT Re-Evaluation 02/02/23    Authorization Type [employer] MCD - PSFS    PT Start Time 1503    PT Stop Time 1545    PT Time Calculation (min) 42 min    Activity Tolerance Patient tolerated treatment well               Past Medical History:  Diagnosis Date   Arthritis    Asthma    long times ago- 12 yrs ago. no meds   Diabetes mellitus    GERD (gastroesophageal reflux disease)    Hypertension    Past Surgical History:  Procedure Laterality Date   ABDOMINAL HYSTERECTOMY     total   COLONOSCOPY WITH PROPOFOL N/A 11/06/2015   Procedure: COLONOSCOPY WITH PROPOFOL;  Surgeon: Bernette Redbird, MD;  Location: [facility];  Service: Endoscopy;  Laterality: N/A;   Patient Active Problem List   Diagnosis Date Noted   Irritable bowel syndrome with both constipation and diarrhea 11/14/2022   Chronic midline low back pain without sciatica 11/14/2022   Paresthesia 11/14/2022   Type 2 diabetes mellitus without complication, without long-term current use of insulin 11/12/2022   HTN (hypertension) 10/20/2013   Morbid obesity 10/20/2013    PCP: Jeani Sow, MD  REFERRING PROVIDER: Jeani Sow, MD  THERAPY DIAG:  Other low back pain  Muscle weakness  REFERRING DIAG: Chronic midline low back pain, unspecified whether sciatica present [M54.50, G89.29], Chronic bilateral low back pain with bilateral sciatica [M54.42, M54.41, G89.29]   Rationale for Evaluation and Treatment:  Rehabilitation  SUBJECTIVE:  PERTINENT PAST HISTORY:  DM TII        PRECAUTIONS: None  WEIGHT BEARING RESTRICTIONS No  FALLS:  Has patient fallen in last 6 months? No, Number of falls: 0  MOI/History of condition:  Onset date: >10  years  SUBJECTIVE STATEMENT Son is present and translates Pt states her back is doing good today for both knees and back. Son is present to assist. States he has not seen her doing any exercises. Pt reports pain is the worst in the morning (rates 10/10).   From eval: Heidi Davis is a 62 y.o. female who presents to clinic with chief complaint of low back pain which started more than 10 years ago.  No acute injury or trauma.  Has slowly gotten worse over that time.  Some n/t in bil legs R>L.  She has some n/t to feet.  She endorses some weakness. No new BB changes.  Denies saddle anesthesia.    Pain:  Are you having pain? Yes Pain location: low back R>L, bil knee pain  NPRS scale:  3/10 Aggravating factors: laying down, standing up, straightening up back Relieving factors: sitting Pain description: intermittent, burning, and aching Stage: Chronic Stability: getting worse 24 hour pattern: worse with activity   Occupation: NA  Patient Goals/Specific Activities: improve pain, stand longer, steps  Lumbar Spinal Stenosis Cluster Bilateral Symptoms   positive Leg pain > back pain   negative Pain during walking/standing  positive Pain relief w/ sitting   positive >48 y/o     positive  Patient Specific Functional Scale:  Activity Eval         stairs 5  walking 7         Standing from sitting 3                    Avg. 5          (Activities rated 0-10/10.  "10" represents "able to perform at prior level" while "0" represents "unable to perform.")    OBJECTIVE:   SENSATION:  Light touch: Deficits L3-S1 on R  LUMBAR AROM  AROM AROM  12/08/2022  Flexion Fingertips to toes (WNL)  Extension WNL  Right lateral flexion WNL  Left lateral flexion WNL  Right rotation WNL  Left rotation WNL    (Blank rows = not tested)   LE MMT:  MMT Right 12/08/2022 Left 12/08/2022  Hip flexion (L2, L3) 3+ 4+  Knee extension (L3) 3+ 4+  Knee flexion    Hip abduction 2+* 3+   Hip extension Partial ROM bridge  Hip external rotation    Hip internal rotation    Hip adduction    Ankle dorsiflexion (L4) C C  Ankle plantarflexion (S1) C C  Ankle inversion    Ankle eversion    Great Toe ext (L5) C C  Grossly     (Blank rows = not tested, score listed is out of 5 possible points.  N = WNL, D = diminished, C = clear for gross weakness with myotome testing, * = concordant pain with testing)   LUMBAR SPECIAL TESTS:  Straight leg raise: L (equivocal), R (equivocal) Slump: L (-), R (-)  MUSCLE LENGTH: Hamstrings: Right subtle, w/ pain restriction; Left subtle, w/ pain restriction ASLR: Right ASLR significantly reduced compared to PSLR ; Left ASLR significantly reduced compared to PSLR  Thomas test: Right significant restriction; Left significant restriction  Functional Tests  Eval (12/08/22)    30'' STS: 6x  UE used? Y                                                          TODAY'S TREATMENT  12/27/22 Therapeutic Exercise - Nustep L5 x 5 min UEs/LEs - LTR 2x30 sec - SKTC x30 sec - PPT 10 x 5 sec - PPT + clamshell green TB 2x10x 5 sec - PPT + marching green TB 2x10 x 5 sec - Bridge 2x10  - S/L hip abd 2x10 - SLR 2x10 - Sitting LAQ green TB 2x10 - Sitting hamstring curl green TB 2x10  12/22/22 Therapeutic Exercise - Nustep L5 x 7 min UEs/LEs - LTR 2x30 sec - SKTC x30 sec - Figure 4 stretch x 30 sec - PPT 10x 5 sec - PPT + clamshell green TB 10 x 5 sec - PPT + ball squeeze 10 x 5 sec - Bridge 2x10 - SLR 2x10   PATIENT EDUCATION:  POC, diagnosis, prognosis, HEP, and outcome measures.  Pt educated via explanation, demonstration, and handout (HEP).  Pt confirms understanding verbally.   HOME EXERCISE PROGRAM: Access Code: [email or web address] URL: [email or web address] Date: 12/08/2022 Prepared by: Alphonzo SeveranceKarl Reinhartsen  Exercises - Supine Lower Trunk Rotation  - 1 x daily - 7 x weekly - 1 sets - 20 reps - 3 hold - Supine Hip  Adduction Isometric with Ball  - 1 x daily - 7 x weekly - 2 sets - 10 reps - 10'' hold - Hooklying Isometric Clamshell  -  1 x daily - 7 x weekly - 3 sets - 10 reps - Bridge  - 1 x daily - 7 x weekly - 3 sets - 5 reps - 2'' hold  Treatment priorities   Eval (12/08/2022)        Hip ext ROM        Core/hip strength        Hip abd strength                          ASSESSMENT:  CLINICAL IMPRESSION: Brittne tolerated session well. Working on progressing core strengthening and hip strengthening as tolerated. Improving motion and TSA activation.  Increasing hip strength but remains weak with glute activation. Continued quad strengthening.    OBJECTIVE IMPAIRMENTS: Pain, core/hip/LE strength, hip ext ROM  ACTIVITY LIMITATIONS: lifting, standing, walking, bending  PERSONAL FACTORS: See medical history and pertinent history   REHAB POTENTIAL: Good  CLINICAL DECISION MAKING: Stable/uncomplicated  EVALUATION COMPLEXITY: Low   GOALS:   SHORT TERM GOALS: Target date: 01/05/2023  Tujuana will be >75% HEP compliant to improve carryover between sessions and facilitate independent management of condition  Evaluation (12/08/22): ongoing Goal status: INITIAL   LONG TERM GOALS: Target date: 02/02/2023  Dashayla will show a >/= 3 pt avg improvement in their PSFS as a proxy for functional improvement   Evaluation/Baseline (12/08/22):   Patient Specific Functional Scale:  Activity Eval         stairs 5         walking 7         Standing from sitting 3                    Avg. 5          (Activities rated 0-10/10.  "10" represents "able to perform at prior level" while "0" represents "unable to perform.")   Goal status: INITIAL   2.  Lynna will self report >/= 50% decrease in pain from evaluation   Evaluation/Baseline (12/08/2022): 10/10 max pain Goal status: INITIAL   3.  Maclovia will report confidence in self management of condition at time of discharge with advanced  HEP  Evaluation/Baseline (12/08/2022): unable to self manage Goal status: INITIAL    4.  Kaleesi will improve the following MMTs to >/= 4/5 to show improvement in strength:     Evaluation/Baseline (12/08/2022):   LE MMT:  MMT Right 12/08/2022 Left 12/08/2022  Hip flexion (L2, L3) 3+ 4+  Knee extension (L3) 3+ 4+  Knee flexion    Hip abduction 2+* 3+  Hip extension Partial ROM bridge  Hip external rotation    Hip internal rotation    Hip adduction    Ankle dorsiflexion (L4) C C  Ankle plantarflexion (S1) C C  Ankle inversion    Ankle eversion    Great Toe ext (L5) C C  Grossly     (Blank rows = not tested, score listed is out of 5 possible points.  N = WNL, D = diminished, C = clear for gross weakness with myotome testing, * = concordant pain with testing)  Goal status: INITIAL   5.  Mckynzi will be able to maintain supine bridge for 20'' (dominant leg extended if 120'' reached) as evidence of improved hip extension and core strength (norm for healthy adult ~170'')   Evaluation/Baseline (12/08/2022): partial ROM single rep Goal status: INITIAL   PLAN: PT FREQUENCY: 1-2x/week  PT DURATION: 8 weeks (Ending  02/02/2023)  PLANNED INTERVENTIONS: Therapeutic exercises, Aquatic therapy, Therapeutic activity, Neuro Muscular re-education, Gait training, Patient/Family education, Joint mobilization, Dry Needling, Electrical stimulation, Spinal mobilization and/or manipulation, Moist heat, Taping, Vasopneumatic device, Ionotophoresis 4mg /ml Dexamethasone, and Manual therapy   Eleasha Cataldo April Ma L Denita Lun, PT 12/27/2022, 3:03 PM

## 2022-12-29 ENCOUNTER — Ambulatory Visit: Payer: Medicaid Other | Admitting: Physical Therapy

## 2022-12-29 NOTE — Therapy (Deleted)
OUTPATIENT PHYSICAL THERAPY THORACOLUMBAR TREATMENT  Patient Name: Heidi Davis MRN: [id number] DOB:06/27/1961, 62 y.o., female Today's Date: 12/29/2022       Past Medical History:  Diagnosis Date   Arthritis    Asthma    long times ago- 12 yrs ago. no meds   Diabetes mellitus    GERD (gastroesophageal reflux disease)    Hypertension    Past Surgical History:  Procedure Laterality Date   ABDOMINAL HYSTERECTOMY     total   COLONOSCOPY WITH PROPOFOL N/A 11/06/2015   Procedure: COLONOSCOPY WITH PROPOFOL;  Surgeon: Bernette Redbird, MD;  Location: [facility];  Service: Endoscopy;  Laterality: N/A;   Patient Active Problem List   Diagnosis Date Noted   Irritable bowel syndrome with both constipation and diarrhea 11/14/2022   Chronic midline low back pain without sciatica 11/14/2022   Paresthesia 11/14/2022   Type 2 diabetes mellitus without complication, without long-term current use of insulin 11/12/2022   HTN (hypertension) 10/20/2013   Morbid obesity 10/20/2013    PCP: Jeani Sow, MD  REFERRING PROVIDER: Jeani Sow, MD  THERAPY DIAG:  No diagnosis found.  REFERRING DIAG: Chronic midline low back pain, unspecified whether sciatica present [M54.50, G89.29], Chronic bilateral low back pain with bilateral sciatica [M54.42, M54.41, G89.29]   Rationale for Evaluation and Treatment:  Rehabilitation  SUBJECTIVE:  PERTINENT PAST HISTORY:  DM TII        PRECAUTIONS: None  WEIGHT BEARING RESTRICTIONS No  FALLS:  Has patient fallen in last 6 months? No, Number of falls: 0  MOI/History of condition:  Onset date: >10 years  SUBJECTIVE STATEMENT Son is present and translates *** Pt states her back is doing good today for both knees and back. Son is present to assist. States he has not seen her doing any exercises. Pt reports pain is the worst in the morning (rates 10/10).   From eval: Heidi Davis is a 62 y.o. female who presents to clinic  with chief complaint of low back pain which started more than 10 years ago.  No acute injury or trauma.  Has slowly gotten worse over that time.  Some n/t in bil legs R>L.  She has some n/t to feet.  She endorses some weakness. No new BB changes.  Denies saddle anesthesia.    Pain:  Are you having pain? Yes Pain location: low back R>L, bil knee pain  NPRS scale:  3/10 Aggravating factors: laying down, standing up, straightening up back Relieving factors: sitting Pain description: intermittent, burning, and aching Stage: Chronic Stability: getting worse 24 hour pattern: worse with activity   Occupation: NA  Patient Goals/Specific Activities: improve pain, stand longer, steps  Lumbar Spinal Stenosis Cluster Bilateral Symptoms   positive Leg pain > back pain   negative Pain during walking/standing  positive Pain relief w/ sitting   positive >48 y/o     positive  Patient Specific Functional Scale:  Activity Eval         stairs 5         walking 7         Standing from sitting 3                    Avg. 5          (Activities rated 0-10/10.  "10" represents "able to perform at prior level" while "0" represents "unable to perform.")    OBJECTIVE:   SENSATION:  Light touch: Deficits L3-S1 on R  LUMBAR AROM  AROM AROM  12/08/2022  Flexion Fingertips to toes (WNL)  Extension WNL  Right lateral flexion WNL  Left lateral flexion WNL  Right rotation WNL  Left rotation WNL    (Blank rows = not tested)   LE MMT:  MMT Right 12/08/2022 Left 12/08/2022  Hip flexion (L2, L3) 3+ 4+  Knee extension (L3) 3+ 4+  Knee flexion    Hip abduction 2+* 3+  Hip extension Partial ROM bridge  Hip external rotation    Hip internal rotation    Hip adduction    Ankle dorsiflexion (L4) C C  Ankle plantarflexion (S1) C C  Ankle inversion    Ankle eversion    Great Toe ext (L5) C C  Grossly     (Blank rows = not tested, score listed is out of 5 possible points.  N = WNL, D =  diminished, C = clear for gross weakness with myotome testing, * = concordant pain with testing)   LUMBAR SPECIAL TESTS:  Straight leg raise: L (equivocal), R (equivocal) Slump: L (-), R (-)  MUSCLE LENGTH: Hamstrings: Right subtle, w/ pain restriction; Left subtle, w/ pain restriction ASLR: Right ASLR significantly reduced compared to PSLR ; Left ASLR significantly reduced compared to PSLR  Thomas test: Right significant restriction; Left significant restriction  Functional Tests  Eval (12/08/22)    30'' STS: 6x  UE used? Y                                                          TODAY'S TREATMENT  12/29/22 Therapeutic Exercise - ***  12/27/22 Therapeutic Exercise - Nustep L5 x 5 min UEs/LEs - LTR 2x30 sec - SKTC x30 sec - PPT 10 x 5 sec - PPT + clamshell green TB 2x10x 5 sec - PPT + marching green TB 2x10 x 5 sec - Bridge 2x10  - S/L hip abd 2x10 - SLR 2x10 - Sitting LAQ green TB 2x10 - Sitting hamstring curl green TB 2x10  12/22/22 Therapeutic Exercise - Nustep L5 x 7 min UEs/LEs - LTR 2x30 sec - SKTC x30 sec - Figure 4 stretch x 30 sec - PPT 10x 5 sec - PPT + clamshell green TB 10 x 5 sec - PPT + ball squeeze 10 x 5 sec - Bridge 2x10 - SLR 2x10   PATIENT EDUCATION:  POC, diagnosis, prognosis, HEP, and outcome measures.  Pt educated via explanation, demonstration, and handout (HEP).  Pt confirms understanding verbally.   HOME EXERCISE PROGRAM: Access Code: [email or web address] URL: [email or web address] Date: 12/08/2022 Prepared by: Alphonzo Severance  Exercises - Supine Lower Trunk Rotation  - 1 x daily - 7 x weekly - 1 sets - 20 reps - 3 hold - Supine Hip Adduction Isometric with Ball  - 1 x daily - 7 x weekly - 2 sets - 10 reps - 10'' hold - Hooklying Isometric Clamshell  - 1 x daily - 7 x weekly - 3 sets - 10 reps - Bridge  - 1 x daily - 7 x weekly - 3 sets - 5 reps - 2'' hold  Treatment priorities   Eval (12/08/2022)        Hip ext  ROM        Core/hip strength        Hip abd strength  ASSESSMENT:  CLINICAL IMPRESSION: [name] tolerated session well. ***    Working on progressing core strengthening and hip strengthening as tolerated. Improving motion and TSA activation.  Increasing hip strength but remains weak with glute activation. Continued quad strengthening.    OBJECTIVE IMPAIRMENTS: Pain, core/hip/LE strength, hip ext ROM  ACTIVITY LIMITATIONS: lifting, standing, walking, bending  PERSONAL FACTORS: See medical history and pertinent history   REHAB POTENTIAL: Good  CLINICAL DECISION MAKING: Stable/uncomplicated  EVALUATION COMPLEXITY: Low   GOALS:   SHORT TERM GOALS: Target date: 01/05/2023  Heidi Davis will be >75% HEP compliant to improve carryover between sessions and facilitate independent management of condition  Evaluation (12/08/22): ongoing Goal status: INITIAL   LONG TERM GOALS: Target date: 02/02/2023  Heidi Davis will show a >/= 3 pt avg improvement in their PSFS as a proxy for functional improvement   Evaluation/Baseline (12/08/22):   Patient Specific Functional Scale:  Activity Eval         stairs 5         walking 7         Standing from sitting 3                    Avg. 5          (Activities rated 0-10/10.  "10" represents "able to perform at prior level" while "0" represents "unable to perform.")   Goal status: INITIAL   2.  Heidi Davis will self report >/= 50% decrease in pain from evaluation   Evaluation/Baseline (12/08/2022): 10/10 max pain Goal status: INITIAL   3.  Heidi Davis will report confidence in self management of condition at time of discharge with advanced HEP  Evaluation/Baseline (12/08/2022): unable to self manage Goal status: INITIAL    4.  Heidi Davis will improve the following MMTs to >/= 4/5 to show improvement in strength:     Evaluation/Baseline (12/08/2022):   LE MMT:  MMT Right 12/08/2022 Left 12/08/2022  Hip flexion (L2, L3) 3+ 4+  Knee  extension (L3) 3+ 4+  Knee flexion    Hip abduction 2+* 3+  Hip extension Partial ROM bridge  Hip external rotation    Hip internal rotation    Hip adduction    Ankle dorsiflexion (L4) C C  Ankle plantarflexion (S1) C C  Ankle inversion    Ankle eversion    Great Toe ext (L5) C C  Grossly     (Blank rows = not tested, score listed is out of 5 possible points.  N = WNL, D = diminished, C = clear for gross weakness with myotome testing, * = concordant pain with testing)  Goal status: INITIAL   5.  Heidi Davis will be able to maintain supine bridge for 20'' (dominant leg extended if 120'' reached) as evidence of improved hip extension and core strength (norm for healthy adult ~170'')   Evaluation/Baseline (12/08/2022): partial ROM single rep Goal status: INITIAL   PLAN: PT FREQUENCY: 1-2x/week  PT DURATION: 8 weeks (Ending 02/02/2023)  PLANNED INTERVENTIONS: Therapeutic exercises, Aquatic therapy, Therapeutic activity, Neuro Muscular re-education, Gait training, Patient/Family education, Joint mobilization, Dry Needling, Electrical stimulation, Spinal mobilization and/or manipulation, Moist heat, Taping, Vasopneumatic device, Ionotophoresis 4mg /ml Dexamethasone, and Manual therapy   Heidi Davis April Ma L Tunis Gentle, PT 12/29/2022, 2:25 PM

## 2023-01-03 ENCOUNTER — Ambulatory Visit: Payer: No Typology Code available for payment source | Admitting: Family Medicine

## 2023-01-03 ENCOUNTER — Ambulatory Visit: Payer: Medicaid Other | Admitting: Physical Therapy

## 2023-01-03 ENCOUNTER — Encounter: Payer: Self-pay | Admitting: Physical Therapy

## 2023-01-03 DIAGNOSIS — M5459 Other low back pain: Secondary | ICD-10-CM

## 2023-01-03 DIAGNOSIS — M6281 Muscle weakness (generalized): Secondary | ICD-10-CM

## 2023-01-03 NOTE — Therapy (Signed)
OUTPATIENT PHYSICAL THERAPY THORACOLUMBAR TREATMENT  Patient Name: Heidi Davis MRN: [id number] DOB:[birth year], 62 y.o., female Today's Date: 01/03/2023   PT End of Session - 01/03/23 1501     Visit Number 4    Number of Visits --   1-2x/week   Date for PT Re-Evaluation 02/02/23    Authorization Type [employer] MCD - PSFS    PT Start Time 1501    PT Stop Time 1545    PT Time Calculation (min) 44 min    Activity Tolerance Patient tolerated treatment well              Past Medical History:  Diagnosis Date   Arthritis    Asthma    long times ago- 12 yrs ago. no meds   Diabetes mellitus    GERD (gastroesophageal reflux disease)    Hypertension    Past Surgical History:  Procedure Laterality Date   ABDOMINAL HYSTERECTOMY     total   COLONOSCOPY WITH PROPOFOL N/A 11/06/2015   Procedure: COLONOSCOPY WITH PROPOFOL;  Surgeon: Bernette Redbird, MD;  Location: [facility];  Service: Endoscopy;  Laterality: N/A;   Patient Active Problem List   Diagnosis Date Noted   Irritable bowel syndrome with both constipation and diarrhea 11/14/2022   Chronic midline low back pain without sciatica 11/14/2022   Paresthesia 11/14/2022   Type 2 diabetes mellitus without complication, without long-term current use of insulin 11/12/2022   HTN (hypertension) 10/20/2013   Morbid obesity 10/20/2013    PCP: Jeani Sow, MD  REFERRING PROVIDER: Jeani Sow, MD  THERAPY DIAG:  Other low back pain  Muscle weakness  REFERRING DIAG: Chronic midline low back pain, unspecified whether sciatica present [M54.50, G89.29], Chronic bilateral low back pain with bilateral sciatica [M54.42, M54.41, G89.29]   Rationale for Evaluation and Treatment:  Rehabilitation  SUBJECTIVE:  PERTINENT PAST HISTORY:  DM TII        PRECAUTIONS: None  WEIGHT BEARING RESTRICTIONS No  FALLS:  Has patient fallen in last 6 months? No, Number of falls: 0  MOI/History of condition:  Onset date: >10  years  SUBJECTIVE STATEMENT Daughter is present and translates Pt reports she is doing good today. Pt states exercises are going well at home. No pain today. Still getting N/T into both feet (R worse than L)  From eval: Heidi Davis is a 62 y.o. female who presents to clinic with chief complaint of low back pain which started more than 10 years ago.  No acute injury or trauma.  Has slowly gotten worse over that time.  Some n/t in bil legs R>L.  She has some n/t to feet.  She endorses some weakness. No new BB changes.  Denies saddle anesthesia.    Pain:  Are you having pain? Yes Pain location: low back R>L, bil knee pain  NPRS scale:  3/10 Aggravating factors: laying down, standing up, straightening up back Relieving factors: sitting Pain description: intermittent, burning, and aching Stage: Chronic Stability: getting worse 24 hour pattern: worse with activity   Occupation: NA  Patient Goals/Specific Activities: improve pain, stand longer, steps  Lumbar Spinal Stenosis Cluster Bilateral Symptoms   positive Leg pain > back pain   negative Pain during walking/standing  positive Pain relief w/ sitting   positive >48 y/o     positive  Patient Specific Functional Scale:  Activity Eval         stairs 5         walking 7  Standing from sitting 3                    Avg. 5          (Activities rated 0-10/10.  "10" represents "able to perform at prior level" while "0" represents "unable to perform.")    OBJECTIVE:   SENSATION:  Light touch: Deficits L3-S1 on R  LUMBAR AROM  AROM AROM  12/08/2022  Flexion Fingertips to toes (WNL)  Extension WNL  Right lateral flexion WNL  Left lateral flexion WNL  Right rotation WNL  Left rotation WNL    (Blank rows = not tested)   LE MMT:  MMT Right 12/08/2022 Left 12/08/2022  Hip flexion (L2, L3) 3+ 4+  Knee extension (L3) 3+ 4+  Knee flexion    Hip abduction 2+* 3+  Hip extension Partial ROM bridge  Hip external  rotation    Hip internal rotation    Hip adduction    Ankle dorsiflexion (L4) C C  Ankle plantarflexion (S1) C C  Ankle inversion    Ankle eversion    Great Toe ext (L5) C C  Grossly     (Blank rows = not tested, score listed is out of 5 possible points.  N = WNL, D = diminished, C = clear for gross weakness with myotome testing, * = concordant pain with testing)   LUMBAR SPECIAL TESTS:  Straight leg raise: L (equivocal), R (equivocal) Slump: L (-), R (-)  MUSCLE LENGTH: Hamstrings: Right subtle, w/ pain restriction; Left subtle, w/ pain restriction ASLR: Right ASLR significantly reduced compared to PSLR ; Left ASLR significantly reduced compared to PSLR  Thomas test: Right significant restriction; Left significant restriction  Functional Tests  Eval (12/08/22)    30'' STS: 6x  UE used? Y                                                          TODAY'S TREATMENT  01/03/23 Therapeutic Exercise - Recumbent bike L1 x 5 min (fatigue) - LTR x10  - SKTC x 30 sec - Sciatic nerve glide x10 - PPT 10 x 5 sec - PPT + SLR 2x10 - Bridge + march 2x5 - S/L hip abd 2x10 - PPT + marching green TB 2x10 x 5 sec - Sit<>stand green TB around knees x10  12/27/22 Therapeutic Exercise - Nustep L5 x 5 min UEs/LEs - LTR 2x30 sec - SKTC x30 sec - PPT 10 x 5 sec - PPT + clamshell green TB 2x10x 5 sec - PPT + marching green TB 2x10 x 5 sec - Bridge 2x10  - S/L hip abd 2x10 - SLR 2x10 - Sitting LAQ green TB 2x10 - Sitting hamstring curl green TB 2x10  12/22/22 Therapeutic Exercise - Nustep L5 x 7 min UEs/LEs - LTR 2x30 sec - SKTC x30 sec - Figure 4 stretch x 30 sec - PPT 10x 5 sec - PPT + clamshell green TB 10 x 5 sec - PPT + ball squeeze 10 x 5 sec - Bridge 2x10 - SLR 2x10   PATIENT EDUCATION:  POC, diagnosis, prognosis, HEP, and outcome measures.  Pt educated via explanation, demonstration, and handout (HEP).  Pt confirms understanding verbally.   HOME EXERCISE  PROGRAM: Access Code: [email or web address] URL: [email or web address] Date: 01/03/2023 Prepared by: Vernon Prey April  Kirstie Peri  Exercises - Supine Lower Trunk Rotation  - 1 x daily - 7 x weekly - 1 sets - 3 reps - 30 sec hold - Supine Active Straight Leg Raise  - 1 x daily - 7 x weekly - 2 sets - 10 reps - Supine March with Resistance Band  - 1 x daily - 7 x weekly - 2 sets - 10 reps - Marching Bridge  - 1 x daily - 7 x weekly - 2 sets - 5 reps - 2 sec hold - Sidelying Hip Abduction  - 1 x daily - 7 x weekly - 2 sets - 10 reps - Sit to Stand Without Arm Support  - 1 x daily - 7 x weekly - 1 sets - 10 reps  Treatment priorities   Eval (12/08/2022)        Hip ext ROM        Core/hip strength        Hip abd strength                          ASSESSMENT:  CLINICAL IMPRESSION: Magdalene tolerated session well. No complaints of pain. Reports fatigue with using recumbent bike but no pain. Able to progress core and hip strengthening. Now able to perform bridge with marching and improve sit<>stand transfers with focus on glute activation. Will progress pt towards increased standing exercises and stairs next session if tolerable. Has N/T that has been ongoing -- worked on adding sciatic nerve glides   OBJECTIVE IMPAIRMENTS: Pain, core/hip/LE strength, hip ext ROM  ACTIVITY LIMITATIONS: lifting, standing, walking, bending  PERSONAL FACTORS: See medical history and pertinent history  REHAB POTENTIAL: Good  CLINICAL DECISION MAKING: Stable/uncomplicated  EVALUATION COMPLEXITY: Low   GOALS:   SHORT TERM GOALS: Target date: 01/05/2023  Sheilia will be >75% HEP compliant to improve carryover between sessions and facilitate independent management of condition  Evaluation (12/08/22): ongoing Goal status: INITIAL   LONG TERM GOALS: Target date: 02/02/2023  Yazaira will show a >/= 3 pt avg improvement in their PSFS as a proxy for functional improvement   Evaluation/Baseline (12/08/22):    Patient Specific Functional Scale:  Activity Eval         stairs 5         walking 7         Standing from sitting 3                    Avg. 5          (Activities rated 0-10/10.  "10" represents "able to perform at prior level" while "0" represents "unable to perform.")   Goal status: INITIAL   2.  Cristiana will self report >/= 50% decrease in pain from evaluation   Evaluation/Baseline (12/08/2022): 10/10 max pain Goal status: INITIAL   3.  Aubreana will report confidence in self management of condition at time of discharge with advanced HEP  Evaluation/Baseline (12/08/2022): unable to self manage Goal status: INITIAL    4.  Sarra will improve the following MMTs to >/= 4/5 to show improvement in strength:     Evaluation/Baseline (12/08/2022):   LE MMT:  MMT Right 12/08/2022 Left 12/08/2022  Hip flexion (L2, L3) 3+ 4+  Knee extension (L3) 3+ 4+  Knee flexion    Hip abduction 2+* 3+  Hip extension Partial ROM bridge  Hip external rotation    Hip internal rotation    Hip adduction  Ankle dorsiflexion (L4) C C  Ankle plantarflexion (S1) C C  Ankle inversion    Ankle eversion    Great Toe ext (L5) C C  Grossly     (Blank rows = not tested, score listed is out of 5 possible points.  N = WNL, D = diminished, C = clear for gross weakness with myotome testing, * = concordant pain with testing)  Goal status: INITIAL   5.  Elvera will be able to maintain supine bridge for 20'' (dominant leg extended if 120'' reached) as evidence of improved hip extension and core strength (norm for healthy adult ~170'')   Evaluation/Baseline (12/08/2022): partial ROM single rep Goal status: INITIAL   PLAN: PT FREQUENCY: 1-2x/week  PT DURATION: 8 weeks (Ending 02/02/2023)  PLANNED INTERVENTIONS: Therapeutic exercises, Aquatic therapy, Therapeutic activity, Neuro Muscular re-education, Gait training, Patient/Family education, Joint mobilization, Dry Needling, Electrical stimulation, Spinal  mobilization and/or manipulation, Moist heat, Taping, Vasopneumatic device, Ionotophoresis 4mg /ml Dexamethasone, and Manual therapy   Leovardo Thoman April Ma L Kelcey Wickstrom, PT 01/03/2023, 3:01 PM

## 2023-01-04 ENCOUNTER — Ambulatory Visit (INDEPENDENT_AMBULATORY_CARE_PROVIDER_SITE_OTHER): Payer: Medicaid Other | Admitting: Family Medicine

## 2023-01-04 ENCOUNTER — Encounter: Payer: Self-pay | Admitting: Family Medicine

## 2023-01-04 VITALS — BP 112/70 | HR 66 | Ht 63.0 in | Wt 205.2 lb

## 2023-01-04 DIAGNOSIS — M545 Low back pain, unspecified: Secondary | ICD-10-CM | POA: Diagnosis not present

## 2023-01-04 DIAGNOSIS — G8929 Other chronic pain: Secondary | ICD-10-CM

## 2023-01-04 NOTE — Patient Instructions (Signed)
Thank you for coming in today.   Continue PT for 1 month.   Return in 1 month.   If not better we can arrange for MRI and possibly injections.

## 2023-01-04 NOTE — Progress Notes (Unsigned)
   I, Stevenson Clinch, CMA acting as a scribe for Clementeen Graham, MD.  Heidi Davis is a 62 y.o. female who presents to [facility] at [facility] today for 6-wk f/u LBP. Pt was last seen by Dr. Denyse Amass on 11/22/22 and was prescribed gabapentin and referred to PT, completing 4 visits. Today, pt reports improvement of sx with PT. Continues to have intermittent flare ups, unable to pinpoint trigger. Denies new or worsening sx since last visit. Sx still flaring up daily.   Dx imaging: 11/22/22 L-spine XR 12/29/2021 R hip x-ray             06/06/19 L-spine x-ray  Pertinent review of systems: ***  Relevant historical information: ***   Exam:  There were no vitals taken for this visit. General: Well Developed, well nourished, and in no acute distress.   MSK: ***    Lab and Radiology Results No results found for this or any previous visit (from the past 72 hour(s)). No results found.     Assessment and Plan: 62 y.o. female with ***   PDMP not reviewed this encounter. No orders of the defined types were placed in this encounter.  No orders of the defined types were placed in this encounter.    Discussed warning signs or symptoms. Please see discharge instructions. Patient expresses understanding.   ***

## 2023-01-05 ENCOUNTER — Encounter: Payer: Self-pay | Admitting: Physical Therapy

## 2023-01-05 ENCOUNTER — Ambulatory Visit: Payer: Medicaid Other | Admitting: Physical Therapy

## 2023-01-05 DIAGNOSIS — M5459 Other low back pain: Secondary | ICD-10-CM

## 2023-01-05 DIAGNOSIS — M6281 Muscle weakness (generalized): Secondary | ICD-10-CM

## 2023-01-05 NOTE — Therapy (Signed)
OUTPATIENT PHYSICAL THERAPY THORACOLUMBAR TREATMENT  Patient Name: Heidi Davis MRN: [id number] DOB:1960/10/25, 62 y.o., female Today's Date: 01/05/2023   PT End of Session - 01/05/23 1459     Visit Number 5    Number of Visits --   1-2x/week   Date for PT Re-Evaluation 02/02/23    Authorization Type [employer] MCD - PSFS    PT Start Time 1500    PT Stop Time 1540    PT Time Calculation (min) 40 min    Activity Tolerance Patient tolerated treatment well               Past Medical History:  Diagnosis Date   Arthritis    Asthma    long times ago- 12 yrs ago. no meds   Diabetes mellitus    GERD (gastroesophageal reflux disease)    Hypertension    Past Surgical History:  Procedure Laterality Date   ABDOMINAL HYSTERECTOMY     total   COLONOSCOPY WITH PROPOFOL N/A 11/06/2015   Procedure: COLONOSCOPY WITH PROPOFOL;  Surgeon: Bernette Redbird, MD;  Location: [facility];  Service: Endoscopy;  Laterality: N/A;   Patient Active Problem List   Diagnosis Date Noted   Irritable bowel syndrome with both constipation and diarrhea 11/14/2022   Chronic midline low back pain without sciatica 11/14/2022   Paresthesia 11/14/2022   Type 2 diabetes mellitus without complication, without long-term current use of insulin 11/12/2022   HTN (hypertension) 10/20/2013   Morbid obesity 10/20/2013    PCP: Jeani Sow, MD  REFERRING PROVIDER: Jeani Sow, MD  THERAPY DIAG:  Other low back pain  Muscle weakness  REFERRING DIAG: Chronic midline low back pain, unspecified whether sciatica present [M54.50, G89.29], Chronic bilateral low back pain with bilateral sciatica [M54.42, M54.41, G89.29]   Rationale for Evaluation and Treatment:  Rehabilitation  SUBJECTIVE:  PERTINENT PAST HISTORY:  DM TII        PRECAUTIONS: None  WEIGHT BEARING RESTRICTIONS No  FALLS:  Has patient fallen in last 6 months? No, Number of falls: 0  MOI/History of condition:  Onset date: >10  years  SUBJECTIVE STATEMENT Son is present and translates Pt notes a little pain last night. Exercise progressions felt okay -- was not too sore. Did note that the night after she did have increase in pain but when she woke up she felt better. No current pain.  From eval: Heidi Davis is a 62 y.o. female who presents to clinic with chief complaint of low back pain which started more than 10 years ago.  No acute injury or trauma.  Has slowly gotten worse over that time.  Some n/t in bil legs R>L.  She has some n/t to feet.  She endorses some weakness. No new BB changes.  Denies saddle anesthesia.    Pain:  Are you having pain? Yes Pain location: low back R>L, bil knee pain  NPRS scale:  3/10 Aggravating factors: laying down, standing up, straightening up back Relieving factors: sitting Pain description: intermittent, burning, and aching Stage: Chronic Stability: getting worse 24 hour pattern: worse with activity   Occupation: NA  Patient Goals/Specific Activities: improve pain, stand longer, steps  Lumbar Spinal Stenosis Cluster Bilateral Symptoms   positive Leg pain > back pain   negative Pain during walking/standing  positive Pain relief w/ sitting   positive >48 y/o     positive  Patient Specific Functional Scale:  Activity Eval         stairs 5  walking 7         Standing from sitting 3                    Avg. 5          (Activities rated 0-10/10.  "10" represents "able to perform at prior level" while "0" represents "unable to perform.")    OBJECTIVE:   SENSATION:  Light touch: Deficits L3-S1 on R  LUMBAR AROM  AROM AROM  12/08/2022  Flexion Fingertips to toes (WNL)  Extension WNL  Right lateral flexion WNL  Left lateral flexion WNL  Right rotation WNL  Left rotation WNL    (Blank rows = not tested)   LE MMT:  MMT Right 12/08/2022 Left 12/08/2022  Hip flexion (L2, L3) 3+ 4+  Knee extension (L3) 3+ 4+  Knee flexion    Hip abduction 2+*  3+  Hip extension Partial ROM bridge  Hip external rotation    Hip internal rotation    Hip adduction    Ankle dorsiflexion (L4) C C  Ankle plantarflexion (S1) C C  Ankle inversion    Ankle eversion    Great Toe ext (L5) C C  Grossly     (Blank rows = not tested, score listed is out of 5 possible points.  N = WNL, D = diminished, C = clear for gross weakness with myotome testing, * = concordant pain with testing)   LUMBAR SPECIAL TESTS:  Straight leg raise: L (equivocal), R (equivocal) Slump: L (-), R (-)  MUSCLE LENGTH: Hamstrings: Right subtle, w/ pain restriction; Left subtle, w/ pain restriction ASLR: Right ASLR significantly reduced compared to PSLR ; Left ASLR significantly reduced compared to PSLR  Thomas test: Right significant restriction; Left significant restriction  Functional Tests  Eval (12/08/22)    30'' STS: 6x  UE used? Y                                                          TODAY'S TREATMENT  01/05/23 Therapeutic Exercise - Nustep L5 x 5 min UEs/LEs - LTR x10  - SKTC x30 sec - Sciatic nerve glide x10 - PPT + SLR 2x10 - S/L clamshell red TB x10, green TB x10 - Prone hip ext with green TB around knees 2x10 - Seated marching green TB around knees 2x10 - Sit<>stand green TB around knees 2x10 - 2 lap amb around gym for cool down  01/03/23 Therapeutic Exercise - Recumbent bike L1 x 5 min (fatigue) - LTR x10  - SKTC x 30 sec - Sciatic nerve glide x10 - PPT 10 x 5 sec - PPT + SLR 2x10 - Bridge + march 2x5 - S/L hip abd 2x10 - PPT + marching green TB 2x10 x 5 sec - Sit<>stand green TB around knees x10  12/27/22 Therapeutic Exercise - Nustep L5 x 5 min UEs/LEs - LTR 2x30 sec - SKTC x30 sec - PPT 10 x 5 sec - PPT + clamshell green TB 2x10x 5 sec - PPT + marching green TB 2x10 x 5 sec - Bridge 2x10  - S/L hip abd 2x10 - SLR 2x10 - Sitting LAQ green TB 2x10 - Sitting hamstring curl green TB 2x10  12/22/22 Therapeutic Exercise -  Nustep L5 x 7 min UEs/LEs - LTR 2x30 sec -  SKTC x30 sec - Figure 4 stretch x 30 sec - PPT 10x 5 sec - PPT + clamshell green TB 10 x 5 sec - PPT + ball squeeze 10 x 5 sec - Bridge 2x10 - SLR 2x10   PATIENT EDUCATION:  POC, diagnosis, prognosis, HEP, and outcome measures.  Pt educated via explanation, demonstration, and handout (HEP).  Pt confirms understanding verbally.   HOME EXERCISE PROGRAM: Access Code: [email or web address] URL: [email or web address] Date: 01/03/2023 Prepared by: Vernon Prey April Kirstie Peri  Exercises - Supine Lower Trunk Rotation  - 1 x daily - 7 x weekly - 1 sets - 3 reps - 30 sec hold - Supine Active Straight Leg Raise  - 1 x daily - 7 x weekly - 2 sets - 10 reps - Supine March with Resistance Band  - 1 x daily - 7 x weekly - 2 sets - 10 reps - Marching Bridge  - 1 x daily - 7 x weekly - 2 sets - 5 reps - 2 sec hold - Sidelying Hip Abduction  - 1 x daily - 7 x weekly - 2 sets - 10 reps - Sit to Stand Without Arm Support  - 1 x daily - 7 x weekly - 1 sets - 10 reps  Treatment priorities   Eval (12/08/2022)        Hip ext ROM        Core/hip strength        Hip abd strength                          ASSESSMENT:  CLINICAL IMPRESSION: Heidi Davis tolerated session well. Has been able to tolerate progression to green TB demonstrating increase in hip and core strength. Pt becomes SHOB with activity -- discussed initiating a walking program to progress her endurance. Continued sciatic nerve glides to address the N/T. Did not think pt was ready today to initiate standing/stair training (had a bout of what sounds like DOMS after last session) but will hopefully be able to initiate next session.   OBJECTIVE IMPAIRMENTS: Pain, core/hip/LE strength, hip ext ROM  ACTIVITY LIMITATIONS: lifting, standing, walking, bending  PERSONAL FACTORS: See medical history and pertinent history  REHAB POTENTIAL: Good  CLINICAL DECISION MAKING: Stable/uncomplicated  EVALUATION  COMPLEXITY: Low   GOALS:   SHORT TERM GOALS: Target date: 01/05/2023  Heidi Davis will be >75% HEP compliant to improve carryover between sessions and facilitate independent management of condition  Evaluation (12/08/22): ongoing Goal status: MET   LONG TERM GOALS: Target date: 02/02/2023  Heidi Davis will show a >/= 3 pt avg improvement in their PSFS as a proxy for functional improvement   Evaluation/Baseline (12/08/22):   Patient Specific Functional Scale:  Activity Eval         stairs 5         walking 7         Standing from sitting 3                    Avg. 5          (Activities rated 0-10/10.  "10" represents "able to perform at prior level" while "0" represents "unable to perform.")   Goal status: INITIAL   2.  Heidi Davis will self report >/= 50% decrease in pain from evaluation   Evaluation/Baseline (12/08/2022): 10/10 max pain Goal status: INITIAL   3.  Heidi Davis will report confidence in self management of condition at time of discharge with advanced HEP  Evaluation/Baseline (12/08/2022): unable to self manage Goal status: INITIAL    4.  Heidi Davis will improve the following MMTs to >/= 4/5 to show improvement in strength:     Evaluation/Baseline (12/08/2022):   LE MMT:  MMT Right 12/08/2022 Left 12/08/2022  Hip flexion (L2, L3) 3+ 4+  Knee extension (L3) 3+ 4+  Knee flexion    Hip abduction 2+* 3+  Hip extension Partial ROM bridge  Hip external rotation    Hip internal rotation    Hip adduction    Ankle dorsiflexion (L4) C C  Ankle plantarflexion (S1) C C  Ankle inversion    Ankle eversion    Great Toe ext (L5) C C  Grossly     (Blank rows = not tested, score listed is out of 5 possible points.  N = WNL, D = diminished, C = clear for gross weakness with myotome testing, * = concordant pain with testing)  Goal status: INITIAL   5.  Heidi Davis will be able to maintain supine bridge for 20'' (dominant leg extended if 120'' reached) as evidence of improved hip extension and core  strength (norm for healthy adult ~170'')   Evaluation/Baseline (12/08/2022): partial ROM single rep Goal status: INITIAL   PLAN: PT FREQUENCY: 1-2x/week  PT DURATION: 8 weeks (Ending 02/02/2023)  PLANNED INTERVENTIONS: Therapeutic exercises, Aquatic therapy, Therapeutic activity, Neuro Muscular re-education, Gait training, Patient/Family education, Joint mobilization, Dry Needling, Electrical stimulation, Spinal mobilization and/or manipulation, Moist heat, Taping, Vasopneumatic device, Ionotophoresis /ml Dexamethasone, and Manual therapy   Brinlee Gambrell April Ma L Keyshawna Prouse, PT 01/05/2023, 2:59 PM

## 2023-01-10 ENCOUNTER — Encounter: Payer: Self-pay | Admitting: Physical Therapy

## 2023-01-10 ENCOUNTER — Ambulatory Visit: Payer: Medicaid Other | Admitting: Physical Therapy

## 2023-01-10 DIAGNOSIS — M6281 Muscle weakness (generalized): Secondary | ICD-10-CM

## 2023-01-10 DIAGNOSIS — M5459 Other low back pain: Secondary | ICD-10-CM | POA: Diagnosis not present

## 2023-01-10 NOTE — Therapy (Signed)
OUTPATIENT PHYSICAL THERAPY THORACOLUMBAR TREATMENT  Patient Name: Heidi Davis MRN: [id number] DOB:[birth year], 62 y.o., female Today's Date: 01/10/2023   PT End of Session - 01/10/23 1502     Visit Number 6    Number of Visits --   1-2x/week   Date for PT Re-Evaluation 02/02/23    Authorization Type [employer] MCD - PSFS    PT Start Time 1502    PT Stop Time 1545    PT Time Calculation (min) 43 min    Activity Tolerance Patient tolerated treatment well               Past Medical History:  Diagnosis Date   Arthritis    Asthma    long times ago- 12 yrs ago. no meds   Diabetes mellitus    GERD (gastroesophageal reflux disease)    Hypertension    Past Surgical History:  Procedure Laterality Date   ABDOMINAL HYSTERECTOMY     total   COLONOSCOPY WITH PROPOFOL N/A 11/06/2015   Procedure: COLONOSCOPY WITH PROPOFOL;  Surgeon: Bernette Redbird, MD;  Location: [facility];  Service: Endoscopy;  Laterality: N/A;   Patient Active Problem List   Diagnosis Date Noted   Irritable bowel syndrome with both constipation and diarrhea 11/14/2022   Chronic midline low back pain without sciatica 11/14/2022   Paresthesia 11/14/2022   Type 2 diabetes mellitus without complication, without long-term current use of insulin 11/12/2022   HTN (hypertension) 10/20/2013   Morbid obesity 10/20/2013    PCP: Jeani Sow, MD  REFERRING PROVIDER: Jeani Sow, MD  THERAPY DIAG:  Other low back pain  Muscle weakness  REFERRING DIAG: Chronic midline low back pain, unspecified whether sciatica present [M54.50, G89.29], Chronic bilateral low back pain with bilateral sciatica [M54.42, M54.41, G89.29]   Rationale for Evaluation and Treatment:  Rehabilitation  SUBJECTIVE:  PERTINENT PAST HISTORY:  DM TII        PRECAUTIONS: None  WEIGHT BEARING RESTRICTIONS No  FALLS:  Has patient fallen in last 6 months? No, Number of falls: 0  MOI/History of condition:  Onset date: >10  years  SUBJECTIVE STATEMENT Daughter is present and translates Pt states she is having some pain this afternoon. She reports she woke up with some mid back pain this morning. Reports compliance to HEP.    From eval: Heidi Davis is a 62 y.o. female who presents to clinic with chief complaint of low back pain which started more than 10 years ago.  No acute injury or trauma.  Has slowly gotten worse over that time.  Some n/t in bil legs R>L.  She has some n/t to feet.  She endorses some weakness. No new BB changes.  Denies saddle anesthesia.    Pain:  Are you having pain? Yes Pain location: low back R>L, bil knee pain  NPRS scale:  3/10 Aggravating factors: laying down, standing up, straightening up back Relieving factors: sitting Pain description: intermittent, burning, and aching Stage: Chronic Stability: getting worse 24 hour pattern: worse with activity   Occupation: NA  Patient Goals/Specific Activities: improve pain, stand longer, steps  Lumbar Spinal Stenosis Cluster Bilateral Symptoms   positive Leg pain > back pain   negative Pain during walking/standing  positive Pain relief w/ sitting   positive >48 y/o     positive  Patient Specific Functional Scale:  Activity Eval         stairs 5         walking 7  Standing from sitting 3                    Avg. 5          (Activities rated 0-10/10.  "10" represents "able to perform at prior level" while "0" represents "unable to perform.")    OBJECTIVE:   SENSATION:  Light touch: Deficits L3-S1 on R  LUMBAR AROM  AROM AROM  12/08/2022  Flexion Fingertips to toes (WNL)  Extension WNL  Right lateral flexion WNL  Left lateral flexion WNL  Right rotation WNL  Left rotation WNL    (Blank rows = not tested)   LE MMT:  MMT Right 12/08/2022 Left 12/08/2022  Hip flexion (L2, L3) 3+ 4+  Knee extension (L3) 3+ 4+  Knee flexion    Hip abduction 2+* 3+  Hip extension Partial ROM bridge  Hip external  rotation    Hip internal rotation    Hip adduction    Ankle dorsiflexion (L4) C C  Ankle plantarflexion (S1) C C  Ankle inversion    Ankle eversion    Great Toe ext (L5) C C  Grossly     (Blank rows = not tested, score listed is out of 5 possible points.  N = WNL, D = diminished, C = clear for gross weakness with myotome testing, * = concordant pain with testing)   LUMBAR SPECIAL TESTS:  Straight leg raise: L (equivocal), R (equivocal) Slump: L (-), R (-)  MUSCLE LENGTH: Hamstrings: Right subtle, w/ pain restriction; Left subtle, w/ pain restriction ASLR: Right ASLR significantly reduced compared to PSLR ; Left ASLR significantly reduced compared to PSLR  Thomas test: Right significant restriction; Left significant restriction  Functional Tests  Eval (12/08/22)    30'' STS: 6x  UE used? Y                                                          TODAY'S TREATMENT  01/10/23 Therapeutic Exercise - Treadmill 1.0 mph x 5 min - Pball flexion x30 sec, lateral flexion x 30 sec - Cat/cow 5x5 sec - Child's pose x30 sec - Side stepping green TB x10 - Hip abd green TB 2x10 - Hip ext green TB 2x10 - Trialed palloff press but pt unable to get good core activation x5 reps - Shoulder ext green TB 2x10x5 sec - Step up 4" step x10 Manual Therapy - STM & TPR thoracolumbar paraspinals  01/05/23 Therapeutic Exercise - Nustep L5 x 5 min UEs/LEs - LTR x10  - SKTC x30 sec - Sciatic nerve glide x10 - PPT + SLR 2x10 - S/L clamshell red TB x10, green TB x10 - Prone hip ext with green TB around knees 2x10 - Seated marching green TB around knees 2x10 - Sit<>stand green TB around knees 2x10 - 2 lap amb around gym for cool down  01/03/23 Therapeutic Exercise - Recumbent bike L1 x 5 min (fatigue) - LTR x10  - SKTC x 30 sec - Sciatic nerve glide x10 - PPT 10 x 5 sec - PPT + SLR 2x10 - Bridge + march 2x5 - S/L hip abd 2x10 - PPT + marching green TB 2x10 x 5 sec -  Sit<>stand green TB around knees x10  12/27/22 Therapeutic Exercise - Nustep L5 x 5 min UEs/LEs -  LTR 2x30 sec - SKTC x30 sec - PPT 10 x 5 sec - PPT + clamshell green TB 2x10x 5 sec - PPT + marching green TB 2x10 x 5 sec - Bridge 2x10  - S/L hip abd 2x10 - SLR 2x10 - Sitting LAQ green TB 2x10 - Sitting hamstring curl green TB 2x10  12/22/22 Therapeutic Exercise - Nustep L5 x 7 min UEs/LEs - LTR 2x30 sec - SKTC x30 sec - Figure 4 stretch x 30 sec - PPT 10x 5 sec - PPT + clamshell green TB 10 x 5 sec - PPT + ball squeeze 10 x 5 sec - Bridge 2x10 - SLR 2x10   PATIENT EDUCATION:  POC, diagnosis, prognosis, HEP, and outcome measures.  Pt educated via explanation, demonstration, and handout (HEP).  Pt confirms understanding verbally.   HOME EXERCISE PROGRAM: Access Code: [email or web address] URL: [email or web address] Date: 01/03/2023 Prepared by: Vernon Prey April Kirstie Peri  Exercises - Supine Lower Trunk Rotation  - 1 x daily - 7 x weekly - 1 sets - 3 reps - 30 sec hold - Supine Active Straight Leg Raise  - 1 x daily - 7 x weekly - 2 sets - 10 reps - Supine March with Resistance Band  - 1 x daily - 7 x weekly - 2 sets - 10 reps - Marching Bridge  - 1 x daily - 7 x weekly - 2 sets - 5 reps - 2 sec hold - Sidelying Hip Abduction  - 1 x daily - 7 x weekly - 2 sets - 10 reps - Sit to Stand Without Arm Support  - 1 x daily - 7 x weekly - 1 sets - 10 reps  Treatment priorities   Eval (12/08/2022)        Hip ext ROM        Core/hip strength        Hip abd strength                          ASSESSMENT:  CLINICAL IMPRESSION: Ezell tolerated session well. Some back pain on PT entry that improved with stretches and manual work. Worked on progressing pt to more standing exercises without exacerbating pain. Tolerating step up well without any back pain.   OBJECTIVE IMPAIRMENTS: Pain, core/hip/LE strength, hip ext ROM  ACTIVITY LIMITATIONS: lifting, standing, walking,  bending  PERSONAL FACTORS: See medical history and pertinent history  REHAB POTENTIAL: Good  CLINICAL DECISION MAKING: Stable/uncomplicated  EVALUATION COMPLEXITY: Low   GOALS:   SHORT TERM GOALS: Target date: 01/05/2023  Zahriyah will be >75% HEP compliant to improve carryover between sessions and facilitate independent management of condition  Evaluation (12/08/22): ongoing Goal status: MET   LONG TERM GOALS: Target date: 02/02/2023  Joliene will show a >/= 3 pt avg improvement in their PSFS as a proxy for functional improvement   Evaluation/Baseline (12/08/22):   Patient Specific Functional Scale:  Activity Eval         stairs 5         walking 7         Standing from sitting 3                    Avg. 5          (Activities rated 0-10/10.  "10" represents "able to perform at prior level" while "0" represents "unable to perform.")   Goal status: INITIAL   2.  Maresa will self report >/= 50%  decrease in pain from evaluation   Evaluation/Baseline (12/08/2022): 10/10 max pain Goal status: INITIAL   3.  Wrenley will report confidence in self management of condition at time of discharge with advanced HEP  Evaluation/Baseline (12/08/2022): unable to self manage Goal status: INITIAL    4.  Nicolemarie will improve the following MMTs to >/= 4/5 to show improvement in strength:     Evaluation/Baseline (12/08/2022):   LE MMT:  MMT Right 12/08/2022 Left 12/08/2022  Hip flexion (L2, L3) 3+ 4+  Knee extension (L3) 3+ 4+  Knee flexion    Hip abduction 2+* 3+  Hip extension Partial ROM bridge  Hip external rotation    Hip internal rotation    Hip adduction    Ankle dorsiflexion (L4) C C  Ankle plantarflexion (S1) C C  Ankle inversion    Ankle eversion    Great Toe ext (L5) C C  Grossly     (Blank rows = not tested, score listed is out of 5 possible points.  N = WNL, D = diminished, C = clear for gross weakness with myotome testing, * = concordant pain with testing)  Goal  status: INITIAL   5.  Latorsha will be able to maintain supine bridge for 20'' (dominant leg extended if 120'' reached) as evidence of improved hip extension and core strength (norm for healthy adult ~170'')   Evaluation/Baseline (12/08/2022): partial ROM single rep Goal status: INITIAL   PLAN: PT FREQUENCY: 1-2x/week  PT DURATION: 8 weeks (Ending 02/02/2023)  PLANNED INTERVENTIONS: Therapeutic exercises, Aquatic therapy, Therapeutic activity, Neuro Muscular re-education, Gait training, Patient/Family education, Joint mobilization, Dry Needling, Electrical stimulation, Spinal mobilization and/or manipulation, Moist heat, Taping, Vasopneumatic device, Ionotophoresis 4mg /ml Dexamethasone, and Manual therapy   Taytem Ghattas April Ma L Kishan [place], PT 01/10/2023, 3:02 PM

## 2023-01-12 ENCOUNTER — Encounter: Payer: Self-pay | Admitting: Physical Therapy

## 2023-01-12 ENCOUNTER — Ambulatory Visit: Payer: Medicaid Other | Admitting: Physical Therapy

## 2023-01-12 ENCOUNTER — Ambulatory Visit: Payer: No Typology Code available for payment source

## 2023-01-12 DIAGNOSIS — M5459 Other low back pain: Secondary | ICD-10-CM | POA: Diagnosis not present

## 2023-01-12 DIAGNOSIS — M6281 Muscle weakness (generalized): Secondary | ICD-10-CM

## 2023-01-12 NOTE — Therapy (Signed)
OUTPATIENT PHYSICAL THERAPY THORACOLUMBAR TREATMENT  Patient Name: Heidi Davis MRN: [id number] DOB:June 19, 1961, 62 y.o., female Today's Date: 01/12/2023   PT End of Session - 01/12/23 1504     Visit Number 7    Number of Visits --   1-2x/week   Date for PT Re-Evaluation 02/02/23    Authorization Type [employer] MCD - PSFS    PT Start Time 1504    PT Stop Time 1545    PT Time Calculation (min) 41 min    Activity Tolerance Patient tolerated treatment well               Past Medical History:  Diagnosis Date   Arthritis    Asthma    long times ago- 12 yrs ago. no meds   Diabetes mellitus    GERD (gastroesophageal reflux disease)    Hypertension    Past Surgical History:  Procedure Laterality Date   ABDOMINAL HYSTERECTOMY     total   COLONOSCOPY WITH PROPOFOL N/A 11/06/2015   Procedure: COLONOSCOPY WITH PROPOFOL;  Surgeon: Bernette Redbird, MD;  Location: [facility];  Service: Endoscopy;  Laterality: N/A;   Patient Active Problem List   Diagnosis Date Noted   Irritable bowel syndrome with both constipation and diarrhea 11/14/2022   Chronic midline low back pain without sciatica 11/14/2022   Paresthesia 11/14/2022   Type 2 diabetes mellitus without complication, without long-term current use of insulin 11/12/2022   HTN (hypertension) 10/20/2013   Morbid obesity 10/20/2013    PCP: Jeani Sow, MD  REFERRING PROVIDER: Jeani Sow, MD  THERAPY DIAG:  Other low back pain  Muscle weakness  REFERRING DIAG: Chronic midline low back pain, unspecified whether sciatica present [M54.50, G89.29], Chronic bilateral low back pain with bilateral sciatica [M54.42, M54.41, G89.29]   Rationale for Evaluation and Treatment:  Rehabilitation  SUBJECTIVE:  PERTINENT PAST HISTORY:  DM TII        PRECAUTIONS: None  WEIGHT BEARING RESTRICTIONS No  FALLS:  Has patient fallen in last 6 months? No, Number of falls: 0  MOI/History of condition:  Onset date: >10  years  SUBJECTIVE STATEMENT Son is present and translates Pt reports some mild mid back pain this afternoon.   From eval: Heidi Davis is a 62 y.o. female who presents to clinic with chief complaint of low back pain which started more than 10 years ago.  No acute injury or trauma.  Has slowly gotten worse over that time.  Some n/t in bil legs R>L.  She has some n/t to feet.  She endorses some weakness. No new BB changes.  Denies saddle anesthesia.    Pain:  Are you having pain? Yes Pain location: low back R>L, bil knee pain  NPRS scale:  3/10 Aggravating factors: laying down, standing up, straightening up back Relieving factors: sitting Pain description: intermittent, burning, and aching Stage: Chronic Stability: getting worse 24 hour pattern: worse with activity   Occupation: NA  Patient Goals/Specific Activities: improve pain, stand longer, steps  Lumbar Spinal Stenosis Cluster Bilateral Symptoms   positive Leg pain > back pain   negative Pain during walking/standing  positive Pain relief w/ sitting   positive >48 y/o     positive  Patient Specific Functional Scale:  Activity Eval         stairs 5         walking 7         Standing from sitting 3  Avg. 5          (Activities rated 0-10/10.  "10" represents "able to perform at prior level" while "0" represents "unable to perform.")    OBJECTIVE:   SENSATION:  Light touch: Deficits L3-S1 on R  LUMBAR AROM  AROM AROM  12/08/2022  Flexion Fingertips to toes (WNL)  Extension WNL  Right lateral flexion WNL  Left lateral flexion WNL  Right rotation WNL  Left rotation WNL    (Blank rows = not tested)   LE MMT:  MMT Right 12/08/2022 Left 12/08/2022  Hip flexion (L2, L3) 3+ 4+  Knee extension (L3) 3+ 4+  Knee flexion    Hip abduction 2+* 3+  Hip extension Partial ROM bridge  Hip external rotation    Hip internal rotation    Hip adduction    Ankle dorsiflexion (L4) C C  Ankle  plantarflexion (S1) C C  Ankle inversion    Ankle eversion    Great Toe ext (L5) C C  Grossly     (Blank rows = not tested, score listed is out of 5 possible points.  N = WNL, D = diminished, C = clear for gross weakness with myotome testing, * = concordant pain with testing)   LUMBAR SPECIAL TESTS:  Straight leg raise: L (equivocal), R (equivocal) Slump: L (-), R (-)  MUSCLE LENGTH: Hamstrings: Right subtle, w/ pain restriction; Left subtle, w/ pain restriction ASLR: Right ASLR significantly reduced compared to PSLR ; Left ASLR significantly reduced compared to PSLR  Thomas test: Right significant restriction; Left significant restriction  Functional Tests  Eval (12/08/22) 4/24   30'' STS: 6x  UE used? Y 30" STS: 8x  UE used? N     TODAY'S TREATMENT  01/12/23 Therapeutic Exercise - "L" stretch x30 sec L & R lateral flexion x30 sec each - Lumbar Extension against counter x5 - Treadmill 0.9 mph x 8 min - Hip abd green TB 2x10 - Hip ext green TB 2x10 - Standing marching green TB 2x10 - Counter plank 3x10 sec - Sit<>stand 2x30 sec - Step up 4" step x10 - Side step 4" step 2x10 - Shoulder ext blue TB x10x5 sec  01/10/23 Therapeutic Exercise - Treadmill 1.0 mph x 5 min - Pball flexion x30 sec, lateral flexion x 30 sec - Cat/cow 5x5 sec - Child's pose x30 sec - Side stepping green TB x10 - Hip abd green TB 2x10 - Hip ext green TB 2x10 - Trialed palloff press but pt unable to get good core activation x5 reps - Shoulder ext green TB 2x10x5 sec - Step up 4" step x10 Manual Therapy - STM & TPR thoracolumbar paraspinals  01/05/23 Therapeutic Exercise - Nustep L5 x 5 min UEs/LEs - LTR x10  - SKTC x30 sec - Sciatic nerve glide x10 - PPT + SLR 2x10 - S/L clamshell red TB x10, green TB x10 - Prone hip ext with green TB around knees 2x10 - Seated marching green TB around knees 2x10 - Sit<>stand green TB around knees 2x10 - 2 lap amb around gym for cool  down  01/03/23 Therapeutic Exercise - Recumbent bike L1 x 5 min (fatigue) - LTR x10  - SKTC x 30 sec - Sciatic nerve glide x10 - PPT 10 x 5 sec - PPT + SLR 2x10 - Bridge + march 2x5 - S/L hip abd 2x10 - PPT + marching green TB 2x10 x 5 sec - Sit<>stand green TB around knees x10  12/27/22 Therapeutic Exercise -  Nustep L5 x 5 min UEs/LEs - LTR 2x30 sec - SKTC x30 sec - PPT 10 x 5 sec - PPT + clamshell green TB 2x10x 5 sec - PPT + marching green TB 2x10 x 5 sec - Bridge 2x10  - S/L hip abd 2x10 - SLR 2x10 - Sitting LAQ green TB 2x10 - Sitting hamstring curl green TB 2x10  12/22/22 Therapeutic Exercise - Nustep L5 x 7 min UEs/LEs - LTR 2x30 sec - SKTC x30 sec - Figure 4 stretch x 30 sec - PPT 10x 5 sec - PPT + clamshell green TB 10 x 5 sec - PPT + ball squeeze 10 x 5 sec - Bridge 2x10 - SLR 2x10   PATIENT EDUCATION:  POC, diagnosis, prognosis, HEP, and outcome measures.  Pt educated via explanation, demonstration, and handout (HEP).  Pt confirms understanding verbally.   HOME EXERCISE PROGRAM: Access Code: [email or web address] URL: [email or web address] Date: 01/03/2023 Prepared by: Vernon Prey April Kirstie Peri  Exercises - Supine Lower Trunk Rotation  - 1 x daily - 7 x weekly - 1 sets - 3 reps - 30 sec hold - Supine Active Straight Leg Raise  - 1 x daily - 7 x weekly - 2 sets - 10 reps - Supine March with Resistance Band  - 1 x daily - 7 x weekly - 2 sets - 10 reps - Marching Bridge  - 1 x daily - 7 x weekly - 2 sets - 5 reps - 2 sec hold - Sidelying Hip Abduction  - 1 x daily - 7 x weekly - 2 sets - 10 reps - Sit to Stand Without Arm Support  - 1 x daily - 7 x weekly - 1 sets - 10 reps  Treatment priorities   Eval (12/08/2022)        Hip ext ROM        Core/hip strength        Hip abd strength                          ASSESSMENT:  CLINICAL IMPRESSION: Tiffanye tolerated session well. Mild midback stiffness this afternoon addressed with stretching. Worked  on improving endurance with walking without back pain. Able to tolerate all exercises in standing now. Has improved her 30 sec STS. Working on improving stair negotiation without pain.   OBJECTIVE IMPAIRMENTS: Pain, core/hip/LE strength, hip ext ROM  ACTIVITY LIMITATIONS: lifting, standing, walking, bending  PERSONAL FACTORS: See medical history and pertinent history  REHAB POTENTIAL: Good  CLINICAL DECISION MAKING: Stable/uncomplicated  EVALUATION COMPLEXITY: Low   GOALS:   SHORT TERM GOALS: Target date: 01/05/2023  Jazyiah will be >75% HEP compliant to improve carryover between sessions and facilitate independent management of condition  Evaluation (12/08/22): ongoing Goal status: MET   LONG TERM GOALS: Target date: 02/02/2023  Serenna will show a >/= 3 pt avg improvement in their PSFS as a proxy for functional improvement   Evaluation/Baseline (12/08/22):   Patient Specific Functional Scale:  Activity Eval         stairs 5         walking 7         Standing from sitting 3                    Avg. 5          (Activities rated 0-10/10.  "10" represents "able to perform at prior level" while "0" represents "unable to perform.")  Goal status: INITIAL   2.  Shaquetta will self report >/= 50% decrease in pain from evaluation   Evaluation/Baseline (12/08/2022): 10/10 max pain Goal status: INITIAL   3.  Armetta will report confidence in self management of condition at time of discharge with advanced HEP  Evaluation/Baseline (12/08/2022): unable to self manage Goal status: INITIAL    4.  Levada will improve the following MMTs to >/= 4/5 to show improvement in strength:     Evaluation/Baseline (12/08/2022):   LE MMT:  MMT Right 12/08/2022 Left 12/08/2022  Hip flexion (L2, L3) 3+ 4+  Knee extension (L3) 3+ 4+  Knee flexion    Hip abduction 2+* 3+  Hip extension Partial ROM bridge  Hip external rotation    Hip internal rotation    Hip adduction    Ankle dorsiflexion (L4) C C   Ankle plantarflexion (S1) C C  Ankle inversion    Ankle eversion    Great Toe ext (L5) C C  Grossly     (Blank rows = not tested, score listed is out of 5 possible points.  N = WNL, D = diminished, C = clear for gross weakness with myotome testing, * = concordant pain with testing)  Goal status: INITIAL   5.  Lular will be able to maintain supine bridge for 20'' (dominant leg extended if 120'' reached) as evidence of improved hip extension and core strength (norm for healthy adult ~170'')   Evaluation/Baseline (12/08/2022): partial ROM single rep Goal status: INITIAL   PLAN: PT FREQUENCY: 1-2x/week  PT DURATION: 8 weeks (Ending 02/02/2023)  PLANNED INTERVENTIONS: Therapeutic exercises, Aquatic therapy, Therapeutic activity, Neuro Muscular re-education, Gait training, Patient/Family education, Joint mobilization, Dry Needling, Electrical stimulation, Spinal mobilization and/or manipulation, Moist heat, Taping, Vasopneumatic device, Ionotophoresis /ml Dexamethasone, and Manual therapy   Ikeem Cleckler April Ma L Jagdeep Ancheta, PT 01/12/2023, 3:04 PM

## 2023-01-17 ENCOUNTER — Ambulatory Visit: Payer: Medicaid Other | Admitting: Physical Therapy

## 2023-01-17 ENCOUNTER — Encounter: Payer: Self-pay | Admitting: Physical Therapy

## 2023-01-17 DIAGNOSIS — M6281 Muscle weakness (generalized): Secondary | ICD-10-CM

## 2023-01-17 DIAGNOSIS — M5459 Other low back pain: Secondary | ICD-10-CM | POA: Diagnosis not present

## 2023-01-17 NOTE — Therapy (Signed)
OUTPATIENT PHYSICAL THERAPY THORACOLUMBAR TREATMENT  Patient Name: Heidi Davis MRN: [id number] DOB:13-Jan-1961, 62 y.o., female Today's Date: 01/17/2023   PT End of Session - 01/17/23 1502     Visit Number 8    Number of Visits --   1-2x/week   Date for PT Re-Evaluation 02/02/23    Authorization Type [employer] MCD - PSFS    PT Start Time 1502    PT Stop Time 1545    PT Time Calculation (min) 43 min    Activity Tolerance Patient tolerated treatment well               Past Medical History:  Diagnosis Date   Arthritis    Asthma    long times ago- 12 yrs ago. no meds   Diabetes mellitus    GERD (gastroesophageal reflux disease)    Hypertension    Past Surgical History:  Procedure Laterality Date   ABDOMINAL HYSTERECTOMY     total   COLONOSCOPY WITH PROPOFOL N/A 11/06/2015   Procedure: COLONOSCOPY WITH PROPOFOL;  Surgeon: Bernette Redbird, MD;  Location: [facility];  Service: Endoscopy;  Laterality: N/A;   Patient Active Problem List   Diagnosis Date Noted   Irritable bowel syndrome with both constipation and diarrhea 11/14/2022   Chronic midline low back pain without sciatica 11/14/2022   Paresthesia 11/14/2022   Type 2 diabetes mellitus without complication, without long-term current use of insulin (HCC) 11/12/2022   HTN (hypertension) 10/20/2013   Morbid obesity (HCC) 10/20/2013    PCP: Jeani Sow, MD  REFERRING PROVIDER: Jeani Sow, MD  THERAPY DIAG:  Other low back pain  Muscle weakness  REFERRING DIAG: Chronic midline low back pain, unspecified whether sciatica present [M54.50, G89.29], Chronic bilateral low back pain with bilateral sciatica [M54.42, M54.41, G89.29]   Rationale for Evaluation and Treatment:  Rehabilitation  SUBJECTIVE:  PERTINENT PAST HISTORY:  DM TII        PRECAUTIONS: None  WEIGHT BEARING RESTRICTIONS No  FALLS:  Has patient fallen in last 6 months? No, Number of falls: 0  MOI/History of condition:  Onset  date: >10 years  SUBJECTIVE STATEMENT Daughter is present and translates Pt notes no issues this weekend. No complaint of pain this afternoon.   From eval: Heidi Davis is a 62 y.o. female who presents to clinic with chief complaint of low back pain which started more than 10 years ago.  No acute injury or trauma.  Has slowly gotten worse over that time.  Some n/t in bil legs R>L.  She has some n/t to feet.  She endorses some weakness. No new BB changes.  Denies saddle anesthesia.    Pain:  Are you having pain? Yes Pain location: low back R>L, bil knee pain  NPRS scale:  0/10 Aggravating factors: laying down, standing up, straightening up back Relieving factors: sitting Pain description: intermittent, burning, and aching Stage: Chronic Stability: getting worse 24 hour pattern: worse with activity   Occupation: NA  Patient Goals/Specific Activities: improve pain, stand longer, steps  Lumbar Spinal Stenosis Cluster Bilateral Symptoms   positive Leg pain > back pain   negative Pain during walking/standing  positive Pain relief w/ sitting   positive >48 y/o     positive  Patient Specific Functional Scale:  Activity Eval 01/17/23        stairs 5 5 "fatigue"        walking 7 10        Standing from sitting 3 5  Avg. 5    Avg 6.7         (Activities rated 0-10/10.  "10" represents "able to perform at prior level" while "0" represents "unable to perform.")    OBJECTIVE:   SENSATION:  Light touch: Deficits L3-S1 on R  LUMBAR AROM  AROM AROM  12/08/2022  Flexion Fingertips to toes (WNL)  Extension WNL  Right lateral flexion WNL  Left lateral flexion WNL  Right rotation WNL  Left rotation WNL    (Blank rows = not tested)   LE MMT:  MMT Right 12/08/2022 Left 12/08/2022 Right 01/17/23 Left  01/17/23  Hip flexion (L2, L3) 3+ 4+ 4+ 5  Knee extension (L3) 3+ 4+ 4+ (mild knee pain) 5  Knee flexion   5 5  Hip abduction 2+* 3+ 4- 4-  Hip extension  Partial ROM bridge    Hip external rotation      Hip internal rotation      Hip adduction      Ankle dorsiflexion (L4) C C    Ankle plantarflexion (S1) C C    Ankle inversion      Ankle eversion      Great Toe ext (L5) C C    Grossly       (Blank rows = not tested, score listed is out of 5 possible points.  N = WNL, D = diminished, C = clear for gross weakness with myotome testing, * = concordant pain with testing)   LUMBAR SPECIAL TESTS:  Straight leg raise: L (equivocal), R (equivocal) Slump: L (-), R (-)  MUSCLE LENGTH: Hamstrings: Right subtle, w/ pain restriction; Left subtle, w/ pain restriction ASLR: Right ASLR significantly reduced compared to PSLR ; Left ASLR significantly reduced compared to PSLR  Thomas test: Right significant restriction; Left significant restriction  Functional Tests  Eval (12/08/22) 4/24   30'' STS: 6x  UE used? Y 30" STS: 8x  UE used? N     TODAY'S TREATMENT  01/17/23 Therapeutic Exercise - Treadmill 0.9 mph x 10 min - SLR red TB 2x10 - Hip flexor stretch 2x30 sec - Hip abd green TB 2x10 - Hip ext green TB 2x10 - Counter plank 2x30 sec (however, felt in back) - Rechecking goals/strength  01/12/23 Therapeutic Exercise - "L" stretch x30 sec L & R lateral flexion x30 sec each - Lumbar Extension against counter x5 - Treadmill 0.9 mph x 8 min - Hip abd green TB 2x10 - Hip ext green TB 2x10 - Standing marching green TB 2x10 - Counter plank 3x10 sec - Sit<>stand 2x30 sec - Step up 4" step x10 - Side step 4" step 2x10 - Shoulder ext blue TB x10x5 sec  01/10/23 Therapeutic Exercise - Treadmill 1.0 mph x 5 min - Pball flexion x30 sec, lateral flexion x 30 sec - Cat/cow 5x5 sec - Child's pose x30 sec - Side stepping green TB x10 - Hip abd green TB 2x10 - Hip ext green TB 2x10 - Trialed palloff press but pt unable to get good core activation x5 reps - Shoulder ext green TB 2x10x5 sec - Step up 4" step x10 Manual Therapy - STM & TPR  thoracolumbar paraspinals  01/05/23 Therapeutic Exercise - Nustep L5 x 5 min UEs/LEs - LTR x10  - SKTC x30 sec - Sciatic nerve glide x10 - PPT + SLR 2x10 - S/L clamshell red TB x10, green TB x10 - Prone hip ext with green TB around knees 2x10 - Seated marching green TB around knees  2x10 - Sit<>stand green TB around knees 2x10 - 2 lap amb around gym for cool down  01/03/23 Therapeutic Exercise - Recumbent bike L1 x 5 min (fatigue) - LTR x10  - SKTC x 30 sec - Sciatic nerve glide x10 - PPT 10 x 5 sec - PPT + SLR 2x10 - Bridge + march 2x5 - S/L hip abd 2x10 - PPT + marching green TB 2x10 x 5 sec - Sit<>stand green TB around knees x10  12/27/22 Therapeutic Exercise - Nustep L5 x 5 min UEs/LEs - LTR 2x30 sec - SKTC x30 sec - PPT 10 x 5 sec - PPT + clamshell green TB 2x10x 5 sec - PPT + marching green TB 2x10 x 5 sec - Bridge 2x10  - S/L hip abd 2x10 - SLR 2x10 - Sitting LAQ green TB 2x10 - Sitting hamstring curl green TB 2x10  12/22/22 Therapeutic Exercise - Nustep L5 x 7 min UEs/LEs - LTR 2x30 sec - SKTC x30 sec - Figure 4 stretch x 30 sec - PPT 10x 5 sec - PPT + clamshell green TB 10 x 5 sec - PPT + ball squeeze 10 x 5 sec - Bridge 2x10 - SLR 2x10   PATIENT EDUCATION:  POC, diagnosis, prognosis, HEP, and outcome measures.  Pt educated via explanation, demonstration, and handout (HEP).  Pt confirms understanding verbally.   HOME EXERCISE PROGRAM: Access Code: [email or web address] URL: [email or web address] Date: 01/03/2023 Prepared by: Vernon Prey April Kirstie Peri  Exercises - Supine Lower Trunk Rotation  - 1 x daily - 7 x weekly - 1 sets - 3 reps - 30 sec hold - Supine Active Straight Leg Raise  - 1 x daily - 7 x weekly - 2 sets - 10 reps - Supine March with Resistance Band  - 1 x daily - 7 x weekly - 2 sets - 10 reps - Marching Bridge  - 1 x daily - 7 x weekly - 2 sets - 5 reps - 2 sec hold - Sidelying Hip Abduction  - 1 x daily - 7 x weekly - 2 sets - 10  reps - Sit to Stand Without Arm Support  - 1 x daily - 7 x weekly - 1 sets - 10 reps  Treatment priorities   Eval (12/08/2022)        Hip ext ROM        Core/hip strength        Hip abd strength                          ASSESSMENT:  CLINICAL IMPRESSION: Dream tolerated session well. No back pain noted this afternoon. Pt is slowly meeting goals. Her hip strength is improving. Pt's biggest complaint is weakness in R quad/pain in R knee as well as ease of fatigue with activities such as stairs. Stairs, walking, and sitting to standing do not cause any back pain. Pt is progressing well.   OBJECTIVE IMPAIRMENTS: Pain, core/hip/LE strength, hip ext ROM  ACTIVITY LIMITATIONS: lifting, standing, walking, bending  PERSONAL FACTORS: See medical history and pertinent history  REHAB POTENTIAL: Good  CLINICAL DECISION MAKING: Stable/uncomplicated  EVALUATION COMPLEXITY: Low   GOALS:   SHORT TERM GOALS: Target date: 01/05/2023  Thaily will be >75% HEP compliant to improve carryover between sessions and facilitate independent management of condition  Evaluation (12/08/22): ongoing Goal status: MET   LONG TERM GOALS: Target date: 02/02/2023  Brayli will show a >/= 3 pt avg improvement in their  PSFS as a proxy for functional improvement   Evaluation/Baseline (12/08/22):   Patient Specific Functional Scale:  Activity Eval 01/17/23        stairs 5 5        walking 7 10        Standing from sitting 3 5                   Avg. 5      Avg. 6.7         (Activities rated 0-10/10.  "10" represents "able to perform at prior level" while "0" represents "unable to perform.")  Goal status: IN PROGRESS   2.  Zhanae will self report >/= 50% decrease in pain from evaluation   Evaluation/Baseline (12/08/2022): 10/10 max pain Goal status: IN PROGRESS   3.  Jayna will report confidence in self management of condition at time of discharge with advanced HEP  Evaluation/Baseline (12/08/2022): unable  to self manage Goal status: IN PROGRESS    4.  Tacha will improve the following MMTs to >/= 4/5 to show improvement in strength:    Evaluation/Baseline (12/08/2022):   LE MMT:  MMT Right 12/08/2022 Left 12/08/2022  Hip flexion (L2, L3) 3+ 4+  Knee extension (L3) 3+ 4+  Knee flexion    Hip abduction 2+* 3+  Hip extension Partial ROM bridge  Hip external rotation    Hip internal rotation    Hip adduction    Ankle dorsiflexion (L4) C C  Ankle plantarflexion (S1) C C  Ankle inversion    Ankle eversion    Great Toe ext (L5) C C  Grossly     (Blank rows = not tested, score listed is out of 5 possible points.  N = WNL, D = diminished, C = clear for gross weakness with myotome testing, * = concordant pain with testing)  Goal status: IN PROGRESS   5.  Briley will be able to maintain supine bridge for 20'' (dominant leg extended if 120'' reached) as evidence of improved hip extension and core strength (norm for healthy adult ~170'')   Evaluation/Baseline (12/08/2022): partial ROM single rep 01/17/23: 85" Goal status: MET   PLAN: PT FREQUENCY: 1-2x/week  PT DURATION: 8 weeks (Ending 02/02/2023)  PLANNED INTERVENTIONS: Therapeutic exercises, Aquatic therapy, Therapeutic activity, Neuro Muscular re-education, Gait training, Patient/Family education, Joint mobilization, Dry Needling, Electrical stimulation, Spinal mobilization and/or manipulation, Moist heat, Taping, Vasopneumatic device, Ionotophoresis 4mg /ml Dexamethasone, and Manual therapy   Sahian Kerney April Ma L Freyja Govea, PT 01/17/2023, 3:03 PM

## 2023-01-18 ENCOUNTER — Ambulatory Visit
Admission: RE | Admit: 2023-01-18 | Discharge: 2023-01-18 | Disposition: A | Discharge status: Home / Self Care | Payer: No Typology Code available for payment source | Source: Ambulatory Visit | Attending: Family Medicine | Admitting: Family Medicine

## 2023-01-18 DIAGNOSIS — Z1231 Encounter for screening mammogram for malignant neoplasm of breast: Secondary | ICD-10-CM

## 2023-01-19 ENCOUNTER — Ambulatory Visit: Payer: Medicaid Other | Admitting: Physical Therapy

## 2023-01-25 ENCOUNTER — Ambulatory Visit: Payer: Medicaid Other

## 2023-01-28 ENCOUNTER — Ambulatory Visit: Payer: Medicaid Other | Attending: Family Medicine | Admitting: Physical Therapy

## 2023-01-28 DIAGNOSIS — M5459 Other low back pain: Secondary | ICD-10-CM

## 2023-01-28 DIAGNOSIS — M6281 Muscle weakness (generalized): Secondary | ICD-10-CM

## 2023-01-28 NOTE — Therapy (Signed)
OUTPATIENT PHYSICAL THERAPY THORACOLUMBAR TREATMENT  Patient Name: Heidi Davis MRN: [id number] DOB:01-16-1961, 62 y.o., female Today's Date: 01/28/2023   PT End of Session - 01/28/23 1019     Visit Number 9    Number of Visits --   1-2x/week   Date for PT Re-Evaluation 02/02/23    Authorization Type [employer] MCD - PSFS    PT Start Time 1019    PT Stop Time 1100    PT Time Calculation (min) 41 min    Activity Tolerance Patient tolerated treatment well               Past Medical History:  Diagnosis Date   Arthritis    Asthma    long times ago- 12 yrs ago. no meds   Diabetes mellitus    GERD (gastroesophageal reflux disease)    Hypertension    Past Surgical History:  Procedure Laterality Date   ABDOMINAL HYSTERECTOMY     total   COLONOSCOPY WITH PROPOFOL N/A 11/06/2015   Procedure: COLONOSCOPY WITH PROPOFOL;  Surgeon: Bernette Redbird, MD;  Location: [facility];  Service: Endoscopy;  Laterality: N/A;   Patient Active Problem List   Diagnosis Date Noted   Irritable bowel syndrome with both constipation and diarrhea 11/14/2022   Chronic midline low back pain without sciatica 11/14/2022   Paresthesia 11/14/2022   Type 2 diabetes mellitus without complication, without long-term current use of insulin (HCC) 11/12/2022   HTN (hypertension) 10/20/2013   Morbid obesity (HCC) 10/20/2013    PCP: Jeani Sow, MD  REFERRING PROVIDER: Rodolph Bong, MD  THERAPY DIAG:  Other low back pain  Muscle weakness  REFERRING DIAG: Chronic midline low back pain, unspecified whether sciatica present [M54.50, G89.29], Chronic bilateral low back pain with bilateral sciatica [M54.42, M54.41, G89.29]   Rationale for Evaluation and Treatment:  Rehabilitation  SUBJECTIVE:  PERTINENT PAST HISTORY:  DM TII        PRECAUTIONS: None  WEIGHT BEARING RESTRICTIONS No  FALLS:  Has patient fallen in last 6 months? No, Number of falls: 0  MOI/History of condition:  Onset date:  >10 years  SUBJECTIVE STATEMENT Daughter is present and translates Pt reports R arm pain (Mostly in forearm). States this has been ongoing x 2 weeks. Has not seen the doctor yet for it.   From eval: Heidi Davis is a 62 y.o. female who presents to clinic with chief complaint of low back pain which started more than 10 years ago.  No acute injury or trauma.  Has slowly gotten worse over that time.  Some n/t in bil legs R>L.  She has some n/t to feet.  She endorses some weakness. No new BB changes.  Denies saddle anesthesia.    Pain:  Are you having pain? Yes Pain location: low back R>L, bil knee pain  NPRS scale:  0/10 Aggravating factors: laying down, standing up, straightening up back Relieving factors: sitting Pain description: intermittent, burning, and aching Stage: Chronic Stability: getting worse 24 hour pattern: worse with activity   Occupation: NA  Patient Goals/Specific Activities: improve pain, stand longer, steps  Lumbar Spinal Stenosis Cluster Bilateral Symptoms   positive Leg pain > back pain   negative Pain during walking/standing  positive Pain relief w/ sitting   positive >48 y/o     positive  Patient Specific Functional Scale:  Activity Eval 01/17/23        stairs 5 5 "fatigue"        walking 7 10  Standing from sitting 3 5                   Avg. 5    Avg 6.7         (Activities rated 0-10/10.  "10" represents "able to perform at prior level" while "0" represents "unable to perform.")    OBJECTIVE:   SENSATION:  Light touch: Deficits L3-S1 on R  LUMBAR AROM  AROM AROM  12/08/2022  Flexion Fingertips to toes (WNL)  Extension WNL  Right lateral flexion WNL  Left lateral flexion WNL  Right rotation WNL  Left rotation WNL    (Blank rows = not tested)   LE MMT:  MMT Right 12/08/2022 Left 12/08/2022 Right 01/17/23 Left  01/17/23  Hip flexion (L2, L3) 3+ 4+ 4+ 5  Knee extension (L3) 3+ 4+ 4+ (mild knee pain) 5  Knee flexion   5 5   Hip abduction 2+* 3+ 4- 4-  Hip extension Partial ROM bridge    Hip external rotation      Hip internal rotation      Hip adduction      Ankle dorsiflexion (L4) C C    Ankle plantarflexion (S1) C C    Ankle inversion      Ankle eversion      Great Toe ext (L5) C C    Grossly       (Blank rows = not tested, score listed is out of 5 possible points.  N = WNL, D = diminished, C = clear for gross weakness with myotome testing, * = concordant pain with testing)   LUMBAR SPECIAL TESTS:  Straight leg raise: L (equivocal), R (equivocal) Slump: L (-), R (-)  MUSCLE LENGTH: Hamstrings: Right subtle, w/ pain restriction; Left subtle, w/ pain restriction ASLR: Right ASLR significantly reduced compared to PSLR ; Left ASLR significantly reduced compared to PSLR  Thomas test: Right significant restriction; Left significant restriction  Functional Tests  Eval (12/08/22) 4/24   30'' STS: 6x  UE used? Y 30" STS: 8x  UE used? N     TODAY'S TREATMENT  01/28/23 Therapeutic Exercise - Seated hamstring stretch x 30 sec - Standing gastroc stretch x 30 sec - Heel raise 2x10 - Standing Marching 2x10 - Side step green TB 4x4 - Bwds/fwds monster walk green TB 4x4 - Counter plank 2x30 sec - Fwd step up 6" step 2x10 - Sit<>stand 10# KB x10, x10 no weight (started to hurt R arm) - Modified deadlift 2x10  01/17/23 Therapeutic Exercise - Treadmill 0.9 mph x 10 min - SLR red TB 2x10 - Hip flexor stretch 2x30 sec - Hip abd green TB 2x10 - Hip ext green TB 2x10 - Counter plank 2x30 sec (however, felt in back) - Rechecking goals/strength  01/12/23 Therapeutic Exercise - "L" stretch x30 sec L & R lateral flexion x30 sec each - Lumbar Extension against counter x5 - Treadmill 0.9 mph x 8 min - Hip abd green TB 2x10 - Hip ext green TB 2x10 - Standing marching green TB 2x10 - Counter plank 3x10 sec - Sit<>stand 2x30 sec - Step up 4" step x10 - Side step 4" step 2x10 - Shoulder ext blue TB  x10x5 sec  01/10/23 Therapeutic Exercise - Treadmill 1.0 mph x 5 min - Pball flexion x30 sec, lateral flexion x 30 sec - Cat/cow 5x5 sec - Child's pose x30 sec - Side stepping green TB x10 - Hip abd green TB 2x10 - Hip ext green TB  2x10 - Trialed palloff press but pt unable to get good core activation x5 reps - Shoulder ext green TB 2x10x5 sec - Step up 4" step x10 Manual Therapy - STM & TPR thoracolumbar paraspinals  01/05/23 Therapeutic Exercise - Nustep L5 x 5 min UEs/LEs - LTR x10  - SKTC x30 sec - Sciatic nerve glide x10 - PPT + SLR 2x10 - S/L clamshell red TB x10, green TB x10 - Prone hip ext with green TB around knees 2x10 - Seated marching green TB around knees 2x10 - Sit<>stand green TB around knees 2x10 - 2 lap amb around gym for cool down  01/03/23 Therapeutic Exercise - Recumbent bike L1 x 5 min (fatigue) - LTR x10  - SKTC x 30 sec - Sciatic nerve glide x10 - PPT 10 x 5 sec - PPT + SLR 2x10 - Bridge + march 2x5 - S/L hip abd 2x10 - PPT + marching green TB 2x10 x 5 sec - Sit<>stand green TB around knees x10  12/27/22 Therapeutic Exercise - Nustep L5 x 5 min UEs/LEs - LTR 2x30 sec - SKTC x30 sec - PPT 10 x 5 sec - PPT + clamshell green TB 2x10x 5 sec - PPT + marching green TB 2x10 x 5 sec - Bridge 2x10  - S/L hip abd 2x10 - SLR 2x10 - Sitting LAQ green TB 2x10 - Sitting hamstring curl green TB 2x10  12/22/22 Therapeutic Exercise - Nustep L5 x 7 min UEs/LEs - LTR 2x30 sec - SKTC x30 sec - Figure 4 stretch x 30 sec - PPT 10x 5 sec - PPT + clamshell green TB 10 x 5 sec - PPT + ball squeeze 10 x 5 sec - Bridge 2x10 - SLR 2x10   PATIENT EDUCATION:  POC, diagnosis, prognosis, HEP, and outcome measures.  Pt educated via explanation, demonstration, and handout (HEP).  Pt confirms understanding verbally.   HOME EXERCISE PROGRAM: Access Code: [email or web address] URL: [email or web address] Date: 01/03/2023 Prepared by: Vernon Prey April Kirstie Peri  Exercises - Supine Lower Trunk Rotation  - 1 x daily - 7 x weekly - 1 sets - 3 reps - 30 sec hold - Supine Active Straight Leg Raise  - 1 x daily - 7 x weekly - 2 sets - 10 reps - Supine March with Resistance Band  - 1 x daily - 7 x weekly - 2 sets - 10 reps - Marching Bridge  - 1 x daily - 7 x weekly - 2 sets - 5 reps - 2 sec hold - Sidelying Hip Abduction  - 1 x daily - 7 x weekly - 2 sets - 10 reps - Sit to Stand Without Arm Support  - 1 x daily - 7 x weekly - 1 sets - 10 reps  Treatment priorities   Eval (12/08/2022)        Hip ext ROM        Core/hip strength        Hip abd strength                          ASSESSMENT:  CLINICAL IMPRESSION: Marylynne tolerated session well. No back pain noted. Reports pain has been decreasing with all activities at home but still present. Notes new R arm pain for the past 2 weeks limiting using increased weights. Continued strengthening as tolerated and working on steps per her goals.   OBJECTIVE IMPAIRMENTS: Pain, core/hip/LE strength, hip ext ROM  ACTIVITY LIMITATIONS: lifting, standing, walking,  bending  PERSONAL FACTORS: See medical history and pertinent history  REHAB POTENTIAL: Good  CLINICAL DECISION MAKING: Stable/uncomplicated  EVALUATION COMPLEXITY: Low   GOALS:   SHORT TERM GOALS: Target date: 01/05/2023  Renata will be >75% HEP compliant to improve carryover between sessions and facilitate independent management of condition  Evaluation (12/08/22): ongoing Goal status: MET   LONG TERM GOALS: Target date: 02/02/2023  Alechia will show a >/= 3 pt avg improvement in their PSFS as a proxy for functional improvement   Evaluation/Baseline (12/08/22):   Patient Specific Functional Scale:  Activity Eval 01/17/23       stairs 5 5       walking 7 10       Standing from sitting 3 5                 Avg. 5      Avg. 6.7        (Activities rated 0-10/10.  "10" represents "able to perform at prior level" while "0"  represents "unable to perform.")  Goal status: IN PROGRESS   2.  Aronda will self report >/= 50% decrease in pain from evaluation   Evaluation/Baseline (12/08/2022): 10/10 max pain Goal status: IN PROGRESS   3.  Deshawnda will report confidence in self management of condition at time of discharge with advanced HEP  Evaluation/Baseline (12/08/2022): unable to self manage Goal status: IN PROGRESS   4.  Jozalyn will improve the following MMTs to >/= 4/5 to show improvement in strength:    Evaluation/Baseline (12/08/2022):   LE MMT:  MMT Right 12/08/2022 Left 12/08/2022  Hip flexion (L2, L3) 3+ 4+  Knee extension (L3) 3+ 4+  Knee flexion    Hip abduction 2+* 3+  Hip extension Partial ROM bridge  Hip external rotation    Hip internal rotation    Hip adduction    Ankle dorsiflexion (L4) C C  Ankle plantarflexion (S1) C C  Ankle inversion    Ankle eversion    Great Toe ext (L5) C C  Grossly     (Blank rows = not tested, score listed is out of 5 possible points.  N = WNL, D = diminished, C = clear for gross weakness with myotome testing, * = concordant pain with testing)  Goal status: IN PROGRESS   5.  Isaac will be able to maintain supine bridge for 20'' (dominant leg extended if 120'' reached) as evidence of improved hip extension and core strength (norm for healthy adult ~170'')   Evaluation/Baseline (12/08/2022): partial ROM single rep 01/17/23: 85" Goal status: MET   PLAN: PT FREQUENCY: 1-2x/week  PT DURATION: 8 weeks (Ending 02/02/2023)  PLANNED INTERVENTIONS: Therapeutic exercises, Aquatic therapy, Therapeutic activity, Neuro Muscular re-education, Gait training, Patient/Family education, Joint mobilization, Dry Needling, Electrical stimulation, Spinal mobilization and/or manipulation, Moist heat, Taping, Vasopneumatic device, Ionotophoresis 4mg /ml Dexamethasone, and Manual therapy   Katelee Schupp April Ma L Dionne Knoop, PT 01/28/2023, 10:20 AM

## 2023-01-31 ENCOUNTER — Ambulatory Visit: Payer: Medicaid Other | Admitting: Physical Therapy

## 2023-01-31 DIAGNOSIS — M5459 Other low back pain: Secondary | ICD-10-CM

## 2023-01-31 DIAGNOSIS — M6281 Muscle weakness (generalized): Secondary | ICD-10-CM

## 2023-01-31 NOTE — Therapy (Signed)
OUTPATIENT PHYSICAL THERAPY THORACOLUMBAR TREATMENT  Patient Name: Heidi Davis MRN: [id number] DOB:1960-09-27, 62 y.o., female Today's Date: 01/31/2023   PT End of Session - 01/31/23 1535     Visit Number 10    Number of Visits --   1-2x/week   Date for PT Re-Evaluation 02/02/23    Authorization Type [employer] MCD - PSFS    PT Start Time 1535    PT Stop Time 1615    PT Time Calculation (min) 40 min    Activity Tolerance Patient tolerated treatment well                Past Medical History:  Diagnosis Date   Arthritis    Asthma    long times ago- 12 yrs ago. no meds   Diabetes mellitus    GERD (gastroesophageal reflux disease)    Hypertension    Past Surgical History:  Procedure Laterality Date   ABDOMINAL HYSTERECTOMY     total   COLONOSCOPY WITH PROPOFOL N/A 11/06/2015   Procedure: COLONOSCOPY WITH PROPOFOL;  Surgeon: Bernette Redbird, MD;  Location: [facility];  Service: Endoscopy;  Laterality: N/A;   Patient Active Problem List   Diagnosis Date Noted   Irritable bowel syndrome with both constipation and diarrhea 11/14/2022   Chronic midline low back pain without sciatica 11/14/2022   Paresthesia 11/14/2022   Type 2 diabetes mellitus without complication, without long-term current use of insulin (HCC) 11/12/2022   HTN (hypertension) 10/20/2013   Morbid obesity (HCC) 10/20/2013    PCP: Jeani Sow, MD  REFERRING PROVIDER: Rodolph Bong, MD  THERAPY DIAG:  Other low back pain  Muscle weakness  REFERRING DIAG: Chronic midline low back pain, unspecified whether sciatica present [M54.50, G89.29], Chronic bilateral low back pain with bilateral sciatica [M54.42, M54.41, G89.29]   Rationale for Evaluation and Treatment:  Rehabilitation  SUBJECTIVE:  PERTINENT PAST HISTORY:  DM TII        PRECAUTIONS: None  WEIGHT BEARING RESTRICTIONS No  FALLS:  Has patient fallen in last 6 months? No, Number of falls: 0  MOI/History of condition:  Onset  date: >10 years  SUBJECTIVE STATEMENT Daughter is present and translates Pt reports some soreness after last session. States she had some pain yesterday (Sunday); however, daughter states she did yard work for 2 hours.   From eval: Heidi Davis is a 62 y.o. female who presents to clinic with chief complaint of low back pain which started more than 10 years ago.  No acute injury or trauma.  Has slowly gotten worse over that time.  Some n/t in bil legs R>L.  She has some n/t to feet.  She endorses some weakness. No new BB changes.  Denies saddle anesthesia.    Pain:  Are you having pain? Yes Pain location: low back R>L, bil knee pain  NPRS scale:  0/10 Aggravating factors: laying down, standing up, straightening up back Relieving factors: sitting Pain description: intermittent, burning, and aching Stage: Chronic Stability: getting worse 24 hour pattern: worse with activity   Occupation: NA  Patient Goals/Specific Activities: improve pain, stand longer, steps  Lumbar Spinal Stenosis Cluster Bilateral Symptoms   positive Leg pain > back pain   negative Pain during walking/standing  positive Pain relief w/ sitting   positive >48 y/o     positive  Patient Specific Functional Scale:  Activity Eval 01/17/23 01/31/23       stairs 5 5 "fatigue"        walking 7  10 10       Standing from sitting 3 5                   Avg. 5    Avg 6.7         (Activities rated 0-10/10.  "10" represents "able to perform at prior level" while "0" represents "unable to perform.")    OBJECTIVE:   SENSATION:  Light touch: Deficits L3-S1 on R  LUMBAR AROM  AROM AROM  12/08/2022  Flexion Fingertips to toes (WNL)  Extension WNL  Right lateral flexion WNL  Left lateral flexion WNL  Right rotation WNL  Left rotation WNL    (Blank rows = not tested)   LE MMT:  MMT Right 12/08/2022 Left 12/08/2022 Right 01/17/23 Left  01/17/23  Hip flexion (L2, L3) 3+ 4+ 4+ 5  Knee extension (L3) 3+ 4+ 4+  (mild knee pain) 5  Knee flexion   5 5  Hip abduction 2+* 3+ 4- 4-  Hip extension Partial ROM bridge    Hip external rotation      Hip internal rotation      Hip adduction      Ankle dorsiflexion (L4) C C    Ankle plantarflexion (S1) C C    Ankle inversion      Ankle eversion      Great Toe ext (L5) C C    Grossly       (Blank rows = not tested, score listed is out of 5 possible points.  N = WNL, D = diminished, C = clear for gross weakness with myotome testing, * = concordant pain with testing)   LUMBAR SPECIAL TESTS:  Straight leg raise: L (equivocal), R (equivocal) Slump: L (-), R (-)  MUSCLE LENGTH: Hamstrings: Right subtle, w/ pain restriction; Left subtle, w/ pain restriction ASLR: Right ASLR significantly reduced compared to PSLR ; Left ASLR significantly reduced compared to PSLR  Thomas test: Right significant restriction; Left significant restriction  Functional Tests  Eval (12/08/22) 4/24   30'' STS: 6x  UE used? Y 30" STS: 8x  UE used? N     TODAY'S TREATMENT  01/31/23 Therapeutic Exercise - Nustep L7 x 10 min maintaining >50 steps/min - Marching green TB 2x10 no UE support - Side step green TB 5x5 - fwds/bwds monster walk green TB 5x5  Therapeutic Activity - Deadlift picking up small object 2x10 - Runner's step up 6" step x10 with UE support, x5 without UE support  01/28/23 Therapeutic Exercise - Seated hamstring stretch x 30 sec - Standing gastroc stretch x 30 sec - Heel raise 2x10 - Standing Marching 2x10 - Side step green TB 4x4 - Bwds/fwds monster walk green TB 4x4 - Counter plank 2x30 sec - Fwd step up 6" step 2x10 - Sit<>stand 10# KB x10, x10 no weight (started to hurt R arm) - Modified deadlift 2x10  01/17/23 Therapeutic Exercise - Treadmill 0.9 mph x 10 min - SLR red TB 2x10 - Hip flexor stretch 2x30 sec - Hip abd green TB 2x10 - Hip ext green TB 2x10 - Counter plank 2x30 sec (however, felt in back) - Rechecking  goals/strength  01/12/23 Therapeutic Exercise - "L" stretch x30 sec L & R lateral flexion x30 sec each - Lumbar Extension against counter x5 - Treadmill 0.9 mph x 8 min - Hip abd green TB 2x10 - Hip ext green TB 2x10 - Standing marching green TB 2x10 - Counter plank 3x10 sec - Sit<>stand 2x30 sec -  Step up 4" step x10 - Side step 4" step 2x10 - Shoulder ext blue TB x10x5 sec  01/10/23 Therapeutic Exercise - Treadmill 1.0 mph x 5 min - Pball flexion x30 sec, lateral flexion x 30 sec - Cat/cow 5x5 sec - Child's pose x30 sec - Side stepping green TB x10 - Hip abd green TB 2x10 - Hip ext green TB 2x10 - Trialed palloff press but pt unable to get good core activation x5 reps - Shoulder ext green TB 2x10x5 sec - Step up 4" step x10 Manual Therapy - STM & TPR thoracolumbar paraspinals  01/05/23 Therapeutic Exercise - Nustep L5 x 5 min UEs/LEs - LTR x10  - SKTC x30 sec - Sciatic nerve glide x10 - PPT + SLR 2x10 - S/L clamshell red TB x10, green TB x10 - Prone hip ext with green TB around knees 2x10 - Seated marching green TB around knees 2x10 - Sit<>stand green TB around knees 2x10 - 2 lap amb around gym for cool down  01/03/23 Therapeutic Exercise - Recumbent bike L1 x 5 min (fatigue) - LTR x10  - SKTC x 30 sec - Sciatic nerve glide x10 - PPT 10 x 5 sec - PPT + SLR 2x10 - Bridge + march 2x5 - S/L hip abd 2x10 - PPT + marching green TB 2x10 x 5 sec - Sit<>stand green TB around knees x10  12/27/22 Therapeutic Exercise - Nustep L5 x 5 min UEs/LEs - LTR 2x30 sec - SKTC x30 sec - PPT 10 x 5 sec - PPT + clamshell green TB 2x10x 5 sec - PPT + marching green TB 2x10 x 5 sec - Bridge 2x10  - S/L hip abd 2x10 - SLR 2x10 - Sitting LAQ green TB 2x10 - Sitting hamstring curl green TB 2x10  12/22/22 Therapeutic Exercise - Nustep L5 x 7 min UEs/LEs - LTR 2x30 sec - SKTC x30 sec - Figure 4 stretch x 30 sec - PPT 10x 5 sec - PPT + clamshell green TB 10 x 5 sec - PPT +  ball squeeze 10 x 5 sec - Bridge 2x10 - SLR 2x10   PATIENT EDUCATION:  POC, diagnosis, prognosis, HEP, and outcome measures.  Pt educated via explanation, demonstration, and handout (HEP).  Pt confirms understanding verbally.   HOME EXERCISE PROGRAM: Access Code: [email or web address] URL: [email or web address] Date: 01/03/2023 Prepared by: Vernon Prey April Kirstie Peri  Exercises - Supine Lower Trunk Rotation  - 1 x daily - 7 x weekly - 1 sets - 3 reps - 30 sec hold - Supine Active Straight Leg Raise  - 1 x daily - 7 x weekly - 2 sets - 10 reps - Supine March with Resistance Band  - 1 x daily - 7 x weekly - 2 sets - 10 reps - Marching Bridge  - 1 x daily - 7 x weekly - 2 sets - 5 reps - 2 sec hold - Sidelying Hip Abduction  - 1 x daily - 7 x weekly - 2 sets - 10 reps - Sit to Stand Without Arm Support  - 1 x daily - 7 x weekly - 1 sets - 10 reps    ASSESSMENT:  CLINICAL IMPRESSION: Hydeia tolerated session well. Improving strength and endurance. Able to push her whole body weight up a step. Worked on improving lifting mechanics to reduce back pain with yard work/home activities. Discussed preparing for d/c.   OBJECTIVE IMPAIRMENTS: Pain, core/hip/LE strength, hip ext ROM  ACTIVITY LIMITATIONS: lifting, standing, walking, bending  PERSONAL FACTORS:  See medical history and pertinent history  REHAB POTENTIAL: Good  CLINICAL DECISION MAKING: Stable/uncomplicated  EVALUATION COMPLEXITY: Low   GOALS:   SHORT TERM GOALS: Target date: 01/05/2023  Ximenna will be >75% HEP compliant to improve carryover between sessions and facilitate independent management of condition  Evaluation (12/08/22): ongoing Goal status: MET   LONG TERM GOALS: Target date: 02/02/2023  Kayliah will show a >/= 3 pt avg improvement in their PSFS as a proxy for functional improvement   Evaluation/Baseline (12/08/22):   Patient Specific Functional Scale:  Activity Eval 01/17/23       stairs 5 5        walking 7 10       Standing from sitting 3 5                 Avg. 5      Avg. 6.7        (Activities rated 0-10/10.  "10" represents "able to perform at prior level" while "0" represents "unable to perform.")  Goal status: IN PROGRESS   2.  Kelcey will self report >/= 50% decrease in pain from evaluation   Evaluation/Baseline (12/08/2022): 10/10 max pain Goal status: IN PROGRESS   3.  Alyssah will report confidence in self management of condition at time of discharge with advanced HEP  Evaluation/Baseline (12/08/2022): unable to self manage Goal status: IN PROGRESS   4.  Luvia will improve the following MMTs to >/= 4/5 to show improvement in strength:    Evaluation/Baseline (12/08/2022):   LE MMT:  MMT Right 12/08/2022 Left 12/08/2022  Hip flexion (L2, L3) 3+ 4+  Knee extension (L3) 3+ 4+  Knee flexion    Hip abduction 2+* 3+  Hip extension Partial ROM bridge  Hip external rotation    Hip internal rotation    Hip adduction    Ankle dorsiflexion (L4) C C  Ankle plantarflexion (S1) C C  Ankle inversion    Ankle eversion    Great Toe ext (L5) C C  Grossly     (Blank rows = not tested, score listed is out of 5 possible points.  N = WNL, D = diminished, C = clear for gross weakness with myotome testing, * = concordant pain with testing)  Goal status: IN PROGRESS   5.  Phylis will be able to maintain supine bridge for 20'' (dominant leg extended if 120'' reached) as evidence of improved hip extension and core strength (norm for healthy adult ~170'')   Evaluation/Baseline (12/08/2022): partial ROM single rep 01/17/23: 85" Goal status: MET   PLAN: PT FREQUENCY: 1-2x/week  PT DURATION: 8 weeks (Ending 02/02/2023)  PLANNED INTERVENTIONS: Therapeutic exercises, Aquatic therapy, Therapeutic activity, Neuro Muscular re-education, Gait training, Patient/Family education, Joint mobilization, Dry Needling, Electrical stimulation, Spinal mobilization and/or manipulation, Moist heat,  Taping, Vasopneumatic device, Ionotophoresis 4mg /ml Dexamethasone, and Manual therapy   Shawntez Dickison April Ma L Sueellen Kayes, PT 01/31/2023, 3:36 PM

## 2023-02-02 ENCOUNTER — Ambulatory Visit: Payer: Medicaid Other

## 2023-02-03 ENCOUNTER — Ambulatory Visit (INDEPENDENT_AMBULATORY_CARE_PROVIDER_SITE_OTHER): Payer: Medicaid Other | Admitting: Family Medicine

## 2023-02-03 ENCOUNTER — Ambulatory Visit: Payer: No Typology Code available for payment source

## 2023-02-03 VITALS — BP 160/90 | HR 65 | Ht 63.0 in | Wt 207.2 lb

## 2023-02-03 DIAGNOSIS — M79601 Pain in right arm: Secondary | ICD-10-CM

## 2023-02-03 DIAGNOSIS — M545 Low back pain, unspecified: Secondary | ICD-10-CM

## 2023-02-03 DIAGNOSIS — G8929 Other chronic pain: Secondary | ICD-10-CM

## 2023-02-03 NOTE — Patient Instructions (Addendum)
Thank you for coming in today.   Please get an Xray today before you leave   Schedule PT for the arm in a few weeks  Ok to take tylneol arthritis 2 pills every 8 hours as needed .

## 2023-02-03 NOTE — Progress Notes (Signed)
Rubin Payor, PhD, LAT, ATC acting as a scribe for Clementeen Graham, MD.  Heidi Davis is a 62 y.o. female who presents to [facility] at [facility] today for 86-month f/u LBP. Pt was last seen by Dr. Denyse Amass on 01/04/23 and was advised to give PT another month, completing 10 total visits. Today, pt reports improvement of pain with physical therapy.  She still is experiencing pain but overall is improved.  She rates her pain as mild to moderate.  She is interested in potentially proceeding with injections if they would be helpful.  Notes aching in the right arm x 3 weeks, MOI unknown. Pain throughout the entire arm, some n/t in the hand/palm. Also notes some decrease in grip strength. Denies neck pain. Pt notes elevated BP readings at home over the past few days. BP elevated at today's visit. Pt reports upcoming visit with PCP, recommended logging BP readings, time of day taken, and any symptoms like dizziness or HA; advised to take BP log with her to next PCP visit.   Dx imaging: 11/22/22 L-spine XR 12/29/2021 R hip x-ray             06/06/19 L-spine x-ray  Pertinent review of systems: No fevers or chills  Relevant historical information: Hypertension and diabetes   Exam:  BP (!) 162/92   Pulse 65   Ht 5\' 3"  (1.6 m)   Wt 207 lb 3.2 oz (94 kg)   SpO2 100%   BMI 36.70 kg/m  General: Well Developed, well nourished, and in no acute distress.   MSK: C-spine: Normal appearing Nontender palpation cervical midline.  Tender palpation right trapezius. Upper extremity strength is diminished right shoulder abduction.  Otherwise upper extremity strength is intact bilaterally. Reflexes are intact.  Right shoulder: Normal-appearing Tender palpation right trapezius. Shoulder range of motion: Abduction limited 110 degrees.  Internal rotation limited lower portion lumbar spine.  External rotation is full. Strength abduction 4/5 external rotation 4/5 internal rotation  5/5. Positive Hawkins and Neer's test. Positive empty can test. Positive Yergason's and speeds test.  Right elbow normal-appearing Nontender. Normal motion. Strength is intact.  Pain with elbow flexion is present. Pain present in the lateral elbow with resisted wrist and finger extension. Pain present in the volar forearm with resisted wrist and finger flexion.  Wrist is nontender.     Lab and Radiology Results  X-ray images cervical spine, right shoulder, and right elbow obtained today personally and independently interpreted  C-spine: Loss of cervical lordosis.  No severe DJD.Marland Kitchen  Right shoulder: Mild glenohumeral DJD.  No acute fractures.  Right elbow: Mild medial spurring and degenerative changes.  No acute fractures are visible.  No severe DJD.     Assessment and Plan: 62 y.o. female with chronic low back pain.  Improved with physical therapy but still having pain.  She does have facet degenerative changes seen on x-ray dated March 2024.  Plan for MRI to further characterize source of pain and for potential facet injection planning.  Right arm pain.  Pain radiates from the trapezius down her lateral upper arm and into the forearm mostly dorsal forearm some volar forearm. I believe her pain is multifactorial.  I think she has rotator cuff tendinopathy and subacromial impingement to explain her shoulder and upper arm pain.  Additionally the forearm pain is thought to be lateral epicondylitis primarily. Cervical radiculopathy is less likely given her clinical presentation and physical exam.  Discussed the case with the physical  therapist she has been seen for her back.  Will transfer PT from her back to her arm.  Recheck in 6 weeks.  Recheck following MRI if sooner.   PDMP not reviewed this encounter. Orders Placed This Encounter  Procedures   DG Cervical Spine 2 or 3 views    Standing Status:   Future    Number of Occurrences:   1    Standing Expiration Date:    02/03/2024    Order Specific Question:   Reason for Exam (SYMPTOM  OR DIAGNOSIS REQUIRED)    Answer:   right arm pain, n/t in the right hand    Order Specific Question:   Preferred imaging location?    Answer:   [facility]   DG Shoulder Right    Standing Status:   Future    Number of Occurrences:   1    Standing Expiration Date:   02/03/2024    Order Specific Question:   Reason for Exam (SYMPTOM  OR DIAGNOSIS REQUIRED)    Answer:   eval rt shoulder pain    Order Specific Question:   Preferred imaging location?    Answer:   [facility]   DG ELBOW COMPLETE RIGHT (3+VIEW)    Standing Status:   Future    Number of Occurrences:   1    Standing Expiration Date:   02/03/2024    Order Specific Question:   Reason for Exam (SYMPTOM  OR DIAGNOSIS REQUIRED)    Answer:   eval elbow pain    Order Specific Question:   Preferred imaging location?    Answer:   [facility]   MR Lumbar Spine Wo Contrast    Standing Status:   Future    Standing Expiration Date:   02/03/2024    Order Specific Question:   What is the patient's sedation requirement?    Answer:   No Sedation    Order Specific Question:   Does the patient have a pacemaker or implanted devices?    Answer:   No    Order Specific Question:   Preferred imaging location?    Answer:   [facility] (table limit-550lbs)   Ambulatory referral to Physical Therapy    Referral Priority:   Routine    Referral Type:   Physical Medicine    Referral Reason:   Specialty Services Required    Requested Specialty:   Physical Therapy   No orders of the defined types were placed in this encounter.    Discussed warning signs or symptoms. Please see discharge instructions. Patient expresses understanding.   The above documentation has been reviewed and is accurate and complete Clementeen Graham, M.D.

## 2023-02-04 ENCOUNTER — Ambulatory Visit: Payer: Medicaid Other | Admitting: Physical Therapy

## 2023-02-04 DIAGNOSIS — M6281 Muscle weakness (generalized): Secondary | ICD-10-CM

## 2023-02-04 DIAGNOSIS — M5459 Other low back pain: Secondary | ICD-10-CM

## 2023-02-04 NOTE — Therapy (Signed)
OUTPATIENT PHYSICAL THERAPY THORACOLUMBAR TREATMENT AND DISCHARGE  PHYSICAL THERAPY DISCHARGE SUMMARY  Visits from Start of Care: 11  Current functional level related to goals / functional outcomes: See below   Remaining deficits: See below   Education / Equipment: See below   Patient agrees to discharge. Patient goals were met. Patient is being discharged due to meeting the stated rehab goals.   Patient Name: Heidi Davis MRN: [id number] DOB:Nov 29, 1960, 62 y.o., female Today's Date: 02/04/2023   PT End of Session - 02/04/23 1103     Visit Number 11    Number of Visits --   1-2x/week   Date for PT Re-Evaluation 02/02/23    Authorization Type [employer] MCD - PSFS    PT Start Time 1103    PT Stop Time 1145    PT Time Calculation (min) 42 min    Activity Tolerance Patient tolerated treatment well             Past Medical History:  Diagnosis Date   Arthritis    Asthma    long times ago- 12 yrs ago. no meds   Diabetes mellitus    GERD (gastroesophageal reflux disease)    Hypertension    Past Surgical History:  Procedure Laterality Date   ABDOMINAL HYSTERECTOMY     total   COLONOSCOPY WITH PROPOFOL N/A 11/06/2015   Procedure: COLONOSCOPY WITH PROPOFOL;  Surgeon: Bernette Redbird, MD;  Location: [facility];  Service: Endoscopy;  Laterality: N/A;   Patient Active Problem List   Diagnosis Date Noted   Irritable bowel syndrome with both constipation and diarrhea 11/14/2022   Chronic midline low back pain without sciatica 11/14/2022   Paresthesia 11/14/2022   Type 2 diabetes mellitus without complication, without long-term current use of insulin (HCC) 11/12/2022   HTN (hypertension) 10/20/2013   Morbid obesity (HCC) 10/20/2013    PCP: Jeani Sow, MD  REFERRING PROVIDER: Rodolph Bong, MD  THERAPY DIAG:  Other low back pain  Muscle weakness  REFERRING DIAG: Chronic midline low back pain, unspecified whether sciatica present [M54.50, G89.29], Chronic  bilateral low back pain with bilateral sciatica [M54.42, M54.41, G89.29]   Rationale for Evaluation and Treatment:  Rehabilitation  SUBJECTIVE:  PERTINENT PAST HISTORY:  DM TII        PRECAUTIONS: None  WEIGHT BEARING RESTRICTIONS No  FALLS:  Has patient fallen in last 6 months? No, Number of falls: 0  MOI/History of condition:  Onset date: >10 years  SUBJECTIVE STATEMENT Daughter is present and translates No back pain today. States she was good after last session. Has been practicing her mechanics at home. Pt states in regards to her arm pain, she wants to wait and see if it will get better with time.   From eval: Heidi Davis is a 62 y.o. female who presents to clinic with chief complaint of low back pain which started more than 10 years ago.  No acute injury or trauma.  Has slowly gotten worse over that time.  Some n/t in bil legs R>L.  She has some n/t to feet.  She endorses some weakness. No new BB changes.  Denies saddle anesthesia.    Pain:  Are you having pain? Yes Pain location: low back R>L, bil knee pain  NPRS scale:  0/10 Aggravating factors: laying down, standing up, straightening up back Relieving factors: sitting Pain description: intermittent, burning, and aching Stage: Chronic Stability: getting worse 24 hour pattern: worse with activity   Occupation: NA  Patient  Goals/Specific Activities: improve pain, stand longer, steps  Lumbar Spinal Stenosis Cluster Bilateral Symptoms   positive Leg pain > back pain   negative Pain during walking/standing  positive Pain relief w/ sitting   positive >48 y/o     positive  Patient Specific Functional Scale:  Activity Eval 01/17/23 01/31/23 02/04/23  stairs 5 5 "fatigue"  5 for 8" height step at home, 10 for 6" height steps in clinic  walking 7 10 10 10   Standing from sitting 3 5  8          Avg. 5    Avg 6.7  Avg 7.67 to 9.33    (Activities rated 0-10/10.  "10" represents "able to perform at prior level"  while "0" represents "unable to perform.")    OBJECTIVE:   SENSATION:  Light touch: Deficits L3-S1 on R  LUMBAR AROM  AROM AROM  12/08/2022  Flexion Fingertips to toes (WNL)  Extension WNL  Right lateral flexion WNL  Left lateral flexion WNL  Right rotation WNL  Left rotation WNL    (Blank rows = not tested)   LE MMT:  MMT Right 12/08/22 Left 12/08/22 Right 01/17/23 Left  01/17/23 Right 02/04/23 Left 02/04/23  Hip flexion (L2, L3) 3+ 4+ 4+ 5 4+ 5  Knee extension (L3) 3+ 4+ 4+ (mild knee pain) 5 5 5   Knee flexion   5 5 5 5   Hip abduction 2+* 3+ 4- 4- 5 5  Hip extension Partial ROM bridge   5 4+  Hip external rotation        Hip internal rotation        Hip adduction        Ankle dorsiflexion (L4) C C      Ankle plantarflexion (S1) C C      Ankle inversion        Ankle eversion        Great Toe ext (L5) C C      Grossly         (Blank rows = not tested, score listed is out of 5 possible points.  N = WNL, D = diminished, C = clear for gross weakness with myotome testing, * = concordant pain with testing)   LUMBAR SPECIAL TESTS:  Straight leg raise: L (equivocal), R (equivocal) Slump: L (-), R (-)  MUSCLE LENGTH: Hamstrings: Right subtle, w/ pain restriction; Left subtle, w/ pain restriction ASLR: Right ASLR significantly reduced compared to PSLR ; Left ASLR significantly reduced compared to PSLR  Thomas test: Right significant restriction; Left significant restriction  Functional Tests  Eval (12/08/22) 4/24 5/17  30'' STS: 6x  UE used? Y 30" STS: 8x  UE used? N 30" STS: 11x  UE used? N    TODAY'S TREATMENT  02/04/23 Therapeutic Activity - Re-checked goals - Step up 8" step 2x10 - Side step up 8" step 2x10  01/31/23 Therapeutic Exercise - Nustep L7 x 10 min maintaining >50 steps/min - Marching green TB 2x10 no UE support - Side step green TB 5x5 - fwds/bwds monster walk green TB 5x5  Therapeutic Activity - Deadlift picking up small object 2x10 -  Runner's step up 6" step x10 with UE support, x5 without UE support  01/28/23 Therapeutic Exercise - Seated hamstring stretch x 30 sec - Standing gastroc stretch x 30 sec - Heel raise 2x10 - Standing Marching 2x10 - Side step green TB 4x4 - Bwds/fwds monster walk green TB 4x4 - Counter plank 2x30 sec - Fwd step  up 6" step 2x10 - Sit<>stand 10# KB x10, x10 no weight (started to hurt R arm) - Modified deadlift 2x10  01/17/23 Therapeutic Exercise - Treadmill 0.9 mph x 10 min - SLR red TB 2x10 - Hip flexor stretch 2x30 sec - Hip abd green TB 2x10 - Hip ext green TB 2x10 - Counter plank 2x30 sec (however, felt in back) - Rechecking goals/strength  01/12/23 Therapeutic Exercise - "L" stretch x30 sec L & R lateral flexion x30 sec each - Lumbar Extension against counter x5 - Treadmill 0.9 mph x 8 min - Hip abd green TB 2x10 - Hip ext green TB 2x10 - Standing marching green TB 2x10 - Counter plank 3x10 sec - Sit<>stand 2x30 sec - Step up 4" step x10 - Side step 4" step 2x10 - Shoulder ext blue TB x10x5 sec  01/10/23 Therapeutic Exercise - Treadmill 1.0 mph x 5 min - Pball flexion x30 sec, lateral flexion x 30 sec - Cat/cow 5x5 sec - Child's pose x30 sec - Side stepping green TB x10 - Hip abd green TB 2x10 - Hip ext green TB 2x10 - Trialed palloff press but pt unable to get good core activation x5 reps - Shoulder ext green TB 2x10x5 sec - Step up 4" step x10 Manual Therapy - STM & TPR thoracolumbar paraspinals  01/05/23 Therapeutic Exercise - Nustep L5 x 5 min UEs/LEs - LTR x10  - SKTC x30 sec - Sciatic nerve glide x10 - PPT + SLR 2x10 - S/L clamshell red TB x10, green TB x10 - Prone hip ext with green TB around knees 2x10 - Seated marching green TB around knees 2x10 - Sit<>stand green TB around knees 2x10 - 2 lap amb around gym for cool down  01/03/23 Therapeutic Exercise - Recumbent bike L1 x 5 min (fatigue) - LTR x10  - SKTC x 30 sec - Sciatic nerve glide  x10 - PPT 10 x 5 sec - PPT + SLR 2x10 - Bridge + march 2x5 - S/L hip abd 2x10 - PPT + marching green TB 2x10 x 5 sec - Sit<>stand green TB around knees x10  12/27/22 Therapeutic Exercise - Nustep L5 x 5 min UEs/LEs - LTR 2x30 sec - SKTC x30 sec - PPT 10 x 5 sec - PPT + clamshell green TB 2x10x 5 sec - PPT + marching green TB 2x10 x 5 sec - Bridge 2x10  - S/L hip abd 2x10 - SLR 2x10 - Sitting LAQ green TB 2x10 - Sitting hamstring curl green TB 2x10  12/22/22 Therapeutic Exercise - Nustep L5 x 7 min UEs/LEs - LTR 2x30 sec - SKTC x30 sec - Figure 4 stretch x 30 sec - PPT 10x 5 sec - PPT + clamshell green TB 10 x 5 sec - PPT + ball squeeze 10 x 5 sec - Bridge 2x10 - SLR 2x10   PATIENT EDUCATION:  POC, diagnosis, prognosis, HEP, and outcome measures.  Pt educated via explanation, demonstration, and handout (HEP).  Pt confirms understanding verbally.   HOME EXERCISE PROGRAM: Access Code: [email or web address] URL: [email or web address] Date: 01/03/2023 Prepared by: Vernon Prey April Kirstie Peri  Exercises - Supine Lower Trunk Rotation  - 1 x daily - 7 x weekly - 1 sets - 3 reps - 30 sec hold - Supine Active Straight Leg Raise  - 1 x daily - 7 x weekly - 2 sets - 10 reps - Supine March with Resistance Band  - 1 x daily - 7 x weekly - 2 sets - 10 reps -  Marching Bridge  - 1 x daily - 7 x weekly - 2 sets - 5 reps - 2 sec hold - Sidelying Hip Abduction  - 1 x daily - 7 x weekly - 2 sets - 10 reps - Sit to Stand Without Arm Support  - 1 x daily - 7 x weekly - 1 sets - 10 reps    ASSESSMENT:  CLINICAL IMPRESSION: Ms. Nakira continues to demonstrate good gains in strength. She has met all of her LTGs. Greatest deficit is still with her stairs at home. She notes standard 6" steps are no problem; however, her steps at home are 8". Worked on her mechanics on 8" step with some difficulty keeping from flexing at the trunk to negotiate -- mild pain noted by end of session. Discussed with  pt to continue to practice this at home with a focus on her glute activation. Pt would like to hold evaluation for her arm pain -- she would like to see if this will improve on its own. Pt is knowledgeable on her body mechanics and her strengthening exercises.   OBJECTIVE IMPAIRMENTS: Pain, core/hip/LE strength, hip ext ROM  ACTIVITY LIMITATIONS: lifting, standing, walking, bending  PERSONAL FACTORS: See medical history and pertinent history  REHAB POTENTIAL: Good  CLINICAL DECISION MAKING: Stable/uncomplicated  EVALUATION COMPLEXITY: Low   GOALS:   SHORT TERM GOALS: Target date: 01/05/2023  Katha will be >75% HEP compliant to improve carryover between sessions and facilitate independent management of condition  Evaluation (12/08/22): ongoing Goal status: MET   LONG TERM GOALS: Target date: 02/02/2023  Audryna will show a >/= 3 pt avg improvement in their PSFS as a proxy for functional improvement  Evaluation/Baseline (12/08/22): See Patient Specific Functional Scale above, average 5 (01/17/23): See above, average 6.7 (02/04/23): See above, Avg 7.67 to 9.33 (depending on step height) Goal status: IN PROGRESS   2.  Kallista will self report >/= 50% decrease in pain from evaluation  Evaluation/Baseline (12/08/2022): 10/10 max pain (02/04/23): 60% Goal status: MET   3.  Kimbra will report confidence in self management of condition at time of discharge with advanced HEP Evaluation/Baseline (12/08/2022): unable to self manage (02/04/23) Consistent with HEP at home, understands body mechanics for lifting and stair negotiation Goal status: MET   4.  Emile will improve the following MMTs to >/= 4/5 to show improvement in strength:    Evaluation/Baseline (12/08/2022) See Chart above (02/04/23) See above Goal status: MET   5.  Gaylen will be able to maintain supine bridge for 20'' (dominant leg extended if 120'' reached) as evidence of improved hip extension and core strength (norm for healthy  adult ~170'')   Evaluation/Baseline (12/08/2022): partial ROM single rep 01/17/23: 85" Goal status: MET   PLAN: PT FREQUENCY: 1-2x/week  PT DURATION: 8 weeks (Ending 02/02/2023)  PLANNED INTERVENTIONS: Therapeutic exercises, Aquatic therapy, Therapeutic activity, Neuro Muscular re-education, Gait training, Patient/Family education, Joint mobilization, Dry Needling, Electrical stimulation, Spinal mobilization and/or manipulation, Moist heat, Taping, Vasopneumatic device, Ionotophoresis 4mg /ml Dexamethasone, and Manual therapy   Daelan Gatt April Ma L Fortune Brannigan, PT 02/04/2023, 11:04 AM

## 2023-02-08 NOTE — Progress Notes (Signed)
Right elbow x-ray shows some medium arthritis changes in the elbow.

## 2023-02-08 NOTE — Progress Notes (Signed)
Right shoulder pain shows mild arthritis.  Heidi Davis

## 2023-02-08 NOTE — Progress Notes (Signed)
Cervical spine x-ray shows a little bit of arthritis changes in the neck.

## 2023-02-10 ENCOUNTER — Encounter: Payer: Self-pay | Admitting: Family Medicine

## 2023-02-10 ENCOUNTER — Ambulatory Visit: Payer: Medicaid Other | Admitting: Family Medicine

## 2023-02-10 VITALS — BP 126/81 | HR 65 | Temp 97.8°F | Resp 18 | Ht 62.99 in | Wt 207.2 lb

## 2023-02-10 DIAGNOSIS — R600 Localized edema: Secondary | ICD-10-CM | POA: Diagnosis not present

## 2023-02-10 DIAGNOSIS — G8929 Other chronic pain: Secondary | ICD-10-CM | POA: Diagnosis not present

## 2023-02-10 DIAGNOSIS — R202 Paresthesia of skin: Secondary | ICD-10-CM

## 2023-02-10 DIAGNOSIS — E119 Type 2 diabetes mellitus without complications: Secondary | ICD-10-CM | POA: Diagnosis not present

## 2023-02-10 DIAGNOSIS — Z7984 Long term (current) use of oral hypoglycemic drugs: Secondary | ICD-10-CM

## 2023-02-10 DIAGNOSIS — I10 Essential (primary) hypertension: Secondary | ICD-10-CM

## 2023-02-10 DIAGNOSIS — E1169 Type 2 diabetes mellitus with other specified complication: Secondary | ICD-10-CM

## 2023-02-10 DIAGNOSIS — M545 Low back pain, unspecified: Secondary | ICD-10-CM | POA: Diagnosis not present

## 2023-02-10 DIAGNOSIS — N951 Menopausal and female climacteric states: Secondary | ICD-10-CM

## 2023-02-10 DIAGNOSIS — E785 Hyperlipidemia, unspecified: Secondary | ICD-10-CM | POA: Diagnosis not present

## 2023-02-10 LAB — COMPREHENSIVE METABOLIC PANEL
ALT: 13 U/L (ref 0–35)
AST: 16 U/L (ref 0–37)
Albumin: 4 g/dL (ref 3.5–5.2)
Alkaline Phosphatase: 103 U/L (ref 39–117)
BUN: 11 mg/dL (ref 6–23)
CO2: 26 mEq/L (ref 19–32)
Calcium: 9.9 mg/dL (ref 8.4–10.5)
Chloride: 106 mEq/L (ref 96–112)
Creatinine, Ser: 0.61 mg/dL (ref 0.40–1.20)
GFR: 95.18 mL/min (ref >60.00)
Glucose, Bld: 113 mg/dL — ABNORMAL HIGH (ref 70–99)
Potassium: 4.1 mEq/L (ref 3.5–5.1)
Sodium: 141 mEq/L (ref 135–145)
Total Bilirubin: 0.4 mg/dL (ref 0.2–1.2)
Total Protein: 7.1 g/dL (ref 6.0–8.3)

## 2023-02-10 LAB — LIPID PANEL
Cholesterol: 172 mg/dL (ref 0–200)
HDL: 46.6 mg/dL (ref >39.00)
LDL Cholesterol: 100 mg/dL — ABNORMAL HIGH (ref 0–99)
NonHDL: 125.06
Total CHOL/HDL Ratio: 4
Triglycerides: 123 mg/dL (ref 0.0–149.0)
VLDL: 24.6 mg/dL (ref 0.0–40.0)

## 2023-02-10 LAB — CBC WITH DIFFERENTIAL/PLATELET
Basophils Absolute: 0 10*3/uL (ref 0.0–0.1)
Basophils Relative: 0.3 % (ref 0.0–3.0)
Eosinophils Absolute: 0.9 10*3/uL — ABNORMAL HIGH (ref 0.0–0.7)
Eosinophils Relative: 9.5 % — ABNORMAL HIGH (ref 0.0–5.0)
HCT: 38.3 % (ref 36.0–46.0)
Hemoglobin: 12.3 g/dL (ref 12.0–15.0)
Lymphocytes Relative: 42.9 % (ref 12.0–46.0)
Lymphs Abs: 4.2 10*3/uL — ABNORMAL HIGH (ref 0.7–4.0)
MCHC: 32.1 g/dL (ref 30.0–36.0)
MCV: 87.3 fl (ref 78.0–100.0)
Monocytes Absolute: 0.5 10*3/uL (ref 0.1–1.0)
Monocytes Relative: 4.7 % (ref 3.0–12.0)
Neutro Abs: 4.2 10*3/uL (ref 1.4–7.7)
Neutrophils Relative %: 42.6 % — ABNORMAL LOW (ref 43.0–77.0)
Platelets: 252 10*3/uL (ref 150.0–400.0)
RBC: 4.39 Mil/uL (ref 3.87–5.11)
RDW: 15.1 % (ref 11.5–15.5)
WBC: 9.8 10*3/uL (ref 4.0–10.5)

## 2023-02-10 LAB — HEMOGLOBIN A1C: Hgb A1c MFr Bld: 8.5 % — ABNORMAL HIGH (ref 4.6–6.5)

## 2023-02-10 MED ORDER — VEOZAH 45 MG PO TABS: 45.0000 mg | ORAL_TABLET | Freq: Every day | ORAL | 2 refills | Status: DC

## 2023-02-10 MED ORDER — HYDROCHLOROTHIAZIDE 25 MG PO TABS: 25.0000 mg | ORAL_TABLET | Freq: Every day | ORAL | 1 refills | Status: DC

## 2023-02-10 NOTE — Patient Instructions (Signed)
Adding hydrochloroTHIAZIDE for fluid daily    also, get compression stockings

## 2023-02-10 NOTE — Assessment & Plan Note (Signed)
Chronic.  Newer diagnosis.  On Lipitor 20 mg for 3 month.  Check lipids,cmp

## 2023-02-10 NOTE — Assessment & Plan Note (Signed)
Chronic.  Controlled.  Continue lisiinopril 10 mg daily.

## 2023-02-10 NOTE — Assessment & Plan Note (Signed)
Chronic.  Will treat w/hydrochloroTHIAZIDE 25 mg daily.  Wear compression stockings.  Check BMP in 2 weeks due to K.

## 2023-02-10 NOTE — Assessment & Plan Note (Signed)
Multifactorial.  Chronic.  Has DM, chronic LBP, taking B12(levels good).  On gabapentin.

## 2023-02-10 NOTE — Assessment & Plan Note (Signed)
Chronic.  Inhibits activity.  Going to physical therapy and seeing sport medicine.  On gabapentin.  Advised tylenol as well.  Getting MRI and possibly referral to pain management for injections

## 2023-02-10 NOTE — Progress Notes (Signed)
Cholesterol better, continue atorvastatin.  Sugars are no better.  Set up a video visit w/daughter soon so we can discuss which medications to add.

## 2023-02-10 NOTE — Assessment & Plan Note (Signed)
Chronic.  Uncontrolled w/hyperglycemia.  More compliant w/metformin 1000mg  twice daily.  Check A1C.  Follow up 3 mo

## 2023-02-10 NOTE — Progress Notes (Signed)
Subjective:     Patient ID: Heidi Davis, female    DOB: 07-18-1961, 62 y.o.   MRN: [id number]  Chief Complaint  Patient presents with   Medical Management of Chronic Issues    3 month follow-up on dm, HTN Bilateral foot numbness Fasting Recent pain in right arm, went to PT Elevated b/p for the last week    HPI-here w/daughter who interprets.  HTN-Pt is on lisinopril 10mg  .  Bp's running high this week(s).  Was ok prior. No ha/dizziness/cp/palp/cough/shortness of breath.   Was getting right arm pain and at physical therapy.   Chronic edema  HLD-atorvastatin 20 mg since March Type 2 DM-metformin 1000mg  twice daily.  Some B foot numbness. 124 2 weeks ago. Middle of day.  Bottom of feet burn.  Arthritis-pain different areas.  Going to physical therapy and seeing sports medicine.  Sleeps under fan for menopausl flashes but then irrit arthritis.  For back pain-getting MRI for possible injections.  On gabapentin  Health Maintenance Due  Topic Date Due   OPHTHALMOLOGY EXAM  Never done   HIV Screening  Never done   Hepatitis C Screening  Never done   PAP SMEAR-Modifier  10/30/2012   DTaP/Tdap/Td (2 - Td or Tdap) 01/08/2021    Past Medical History:  Diagnosis Date   Arthritis    Asthma    long times ago- 12 yrs ago. no meds   Diabetes mellitus    GERD (gastroesophageal reflux disease)    Hypertension     Past Surgical History:  Procedure Laterality Date   ABDOMINAL HYSTERECTOMY     total   COLONOSCOPY WITH PROPOFOL N/A 11/06/2015   Procedure: COLONOSCOPY WITH PROPOFOL;  Surgeon: Bernette Redbird, MD;  Location: [facility];  Service: Endoscopy;  Laterality: N/A;     Current Outpatient Medications:    atorvastatin (LIPITOR) 20 MG tablet, Take 1 tablet (20 mg total) by mouth daily., Disp: 90 tablet, Rfl: 1   Cholecalciferol 50 MCG (2000 UT) CAPS, Take 1 capsule by mouth daily., Disp: , Rfl:    Cyanocobalamin (B-12 PO), Take 5,000 mcg by mouth daily. B 12 liquid, Disp: ,  Rfl:    Fezolinetant (VEOZAH) 45 MG TABS, Take 1 tablet (45 mg total) by mouth daily in the afternoon., Disp: 30 tablet, Rfl: 2   gabapentin (NEURONTIN) 100 MG capsule, Take 1-3 capsules (100-300 mg total) by mouth at bedtime as needed (nerve pain)., Disp: 90 capsule, Rfl: 3   hydrochlorothiazide (HYDRODIURIL) 25 MG tablet, Take 1 tablet (25 mg total) by mouth daily., Disp: 30 tablet, Rfl: 1   Lactobacillus (PROBIOTIC ACIDOPHILUS PO), Take by mouth daily., Disp: , Rfl:    metFORMIN (GLUCOPHAGE) 1000 MG tablet, Take 1 tablet (1,000 mg total) by mouth 2 (two) times daily with a meal., Disp: 180 tablet, Rfl: 1   Multiple Vitamins-Minerals (WOMENS MULTIVITAMIN PO), Take 1 tablet by mouth daily., Disp: , Rfl:    Omega-3 Fatty Acids (OMEGA 3 PO), Take 300 mg by mouth daily., Disp: , Rfl:    PAPAYA ENZYME PO, Take 1 tablet by mouth daily., Disp: , Rfl:    VITAMIN A PO, Take 2,400 mg by mouth daily., Disp: , Rfl:    Zinc 50 MG TABS, Take 50 mg by mouth daily., Disp: , Rfl:    lisinopril (ZESTRIL) 10 MG tablet, Take by mouth., Disp: , Rfl:   Allergies  Allergen Reactions   Pollen Extract     06/20/2015 -   ROS neg/noncontributory except as  noted HPI/below      Objective:     BP 126/81   Pulse 65   Temp 97.8 F (36.6 C) (Temporal)   Resp 18   Ht 5' 2.99" (1.6 m)   Wt 207 lb 4 oz (94 kg)   SpO2 100%   BMI 36.72 kg/m  Wt Readings from Last 3 Encounters:  02/10/23 207 lb 4 oz (94 kg)  02/03/23 207 lb 3.2 oz (94 kg)  01/04/23 205 lb 3.2 oz (93.1 kg)    Physical Exam   Gen: WDWN NAD HEENT: NCAT, conjunctiva not injected, sclera nonicteric NECK:  supple, no thyromegaly, no nodes, no carotid bruits CARDIAC: RRR, S1S2+, no murmur. DP 2+B LUNGS: CTAB. No wheezes ABDOMEN:  BS+, soft, NTND, No HSM, no masses EXT:  no edema MSK: no gross abnormalities.  NEURO: A&O x3.  CN II-XII intact.  PSYCH: normal mood. Good eye contact     Assessment & Plan:  Type 2 diabetes mellitus without  complication, without long-term current use of insulin (HCC) Assessment & Plan: Chronic.  Uncontrolled w/hyperglycemia.  More compliant w/metformin 1000mg  twice daily.  Check A1C.  Follow up 3 mo  Orders: -     Comprehensive metabolic panel -     Hemoglobin A1c  Primary hypertension Assessment & Plan: Chronic.  Controlled.  Continue lisiinopril 10 mg daily.   Orders: -     CBC with Differential/Platelet -     Comprehensive metabolic panel -     Basic metabolic panel; Future  Hyperlipidemia associated with type 2 diabetes mellitus (HCC) Assessment & Plan: Chronic.  Newer diagnosis.  On Lipitor 20 mg for 3 month.  Check lipids,cmp  Orders: -     Comprehensive metabolic panel -     Lipid panel  Paresthesia Assessment & Plan: Multifactorial.  Chronic.  Has DM, chronic LBP, taking B12(levels good).  On gabapentin.   Hot flash, menopausal  Chronic midline low back pain without sciatica Assessment & Plan: Chronic.  Inhibits activity.  Going to physical therapy and seeing sport medicine.  On gabapentin.  Advised tylenol as well.  Getting MRI and possibly referral to pain management for injections   Long term current use of oral hypoglycemic drug  Local edema Assessment & Plan: Chronic.  Will treat w/hydrochloroTHIAZIDE 25 mg daily.  Wear compression stockings.  Check BMP in 2 weeks due to K.   Other orders -     Veozah; Take 1 tablet (45 mg total) by mouth daily in the afternoon.  Dispense: 30 tablet; Refill: 2 -     hydroCHLOROthiazide; Take 1 tablet (25 mg total) by mouth daily.  Dispense: 30 tablet; Refill: 1  Post menopausal hot flashes-I don't think hormone replacement therapy indicated as many years postmenopausal and mult risk factors.  Will do veozah 45 mg daily.    Follow up labs 2 weeks, me in 3 months  Angelena Sole, MD

## 2023-02-11 ENCOUNTER — Encounter: Payer: Self-pay | Admitting: *Deleted

## 2023-02-11 ENCOUNTER — Telehealth: Payer: Self-pay | Admitting: Family Medicine

## 2023-02-11 NOTE — Telephone Encounter (Signed)
Returned call to daughter, scheduled for 02/21/23.

## 2023-02-11 NOTE — Telephone Encounter (Signed)
Pt states she was calling Q back. She would like a call back.

## 2023-02-16 ENCOUNTER — Ambulatory Visit
Admission: RE | Admit: 2023-02-16 | Discharge: 2023-02-16 | Disposition: A | Discharge status: Home / Self Care | Payer: Medicaid Other | Source: Ambulatory Visit | Attending: Family Medicine | Admitting: Family Medicine

## 2023-02-16 DIAGNOSIS — H5213 Myopia, bilateral: Secondary | ICD-10-CM | POA: Diagnosis not present

## 2023-02-16 DIAGNOSIS — G8929 Other chronic pain: Secondary | ICD-10-CM

## 2023-02-16 DIAGNOSIS — M545 Low back pain, unspecified: Secondary | ICD-10-CM | POA: Diagnosis not present

## 2023-02-21 ENCOUNTER — Telehealth: Payer: No Typology Code available for payment source | Admitting: Family Medicine

## 2023-02-23 ENCOUNTER — Other Ambulatory Visit (INDEPENDENT_AMBULATORY_CARE_PROVIDER_SITE_OTHER): Payer: Medicaid Other

## 2023-02-23 DIAGNOSIS — I10 Essential (primary) hypertension: Secondary | ICD-10-CM

## 2023-02-23 LAB — BASIC METABOLIC PANEL
BUN: 13 mg/dL (ref 6–23)
CO2: 22 mEq/L (ref 19–32)
Calcium: 10 mg/dL (ref 8.4–10.5)
Chloride: 101 mEq/L (ref 96–112)
Creatinine, Ser: 0.68 mg/dL (ref 0.40–1.20)
GFR: 92.7 mL/min (ref >60.00)
Glucose, Bld: 245 mg/dL — ABNORMAL HIGH (ref 70–99)
Potassium: 3.6 mEq/L (ref 3.5–5.1)
Sodium: 137 mEq/L (ref 135–145)

## 2023-02-24 ENCOUNTER — Other Ambulatory Visit: Payer: No Typology Code available for payment source

## 2023-02-24 NOTE — Progress Notes (Signed)
Ok except sugar way too high

## 2023-02-28 ENCOUNTER — Encounter: Payer: Self-pay | Admitting: Family Medicine

## 2023-02-28 ENCOUNTER — Telehealth (INDEPENDENT_AMBULATORY_CARE_PROVIDER_SITE_OTHER): Payer: Medicaid Other | Admitting: Family Medicine

## 2023-02-28 DIAGNOSIS — E119 Type 2 diabetes mellitus without complications: Secondary | ICD-10-CM

## 2023-02-28 DIAGNOSIS — Z7984 Long term (current) use of oral hypoglycemic drugs: Secondary | ICD-10-CM | POA: Diagnosis not present

## 2023-02-28 NOTE — Progress Notes (Signed)
MyChart Video Visit Virtual Visit via Video Note   This visit type was conducted w/patient consent. This format is felt to be most appropriate for this patient at this time. Physical exam was limited by quality of the video and audio technology used for the visit. CMA was able to get the patient set up on a video visit.  Patient location: Home. Patient daughter(Fatima) and provider in visit Provider location: Office  I discussed the limitations of evaluation and management by telemedicine and the availability of in person appointments. The patient expressed understanding and agreed to proceed.  Visit Date: 02/28/2023  Today's healthcare provider: Angelena Sole, MD     Subjective:    Patient ID: Heidi Davis, female    DOB: 1961/01/07, 62 y.o.   MRN: [id number]  Chief Complaint  Patient presents with   Medical Management of Chronic Issues    Discuss treatment options for diabetes     HPI  DM-on metformin 1000mg  twice daily.  Occasional checks sugars.  A1C 8.5.  video done w/daughter as patient not speak [place].    Past Medical History:  Diagnosis Date   Arthritis    Asthma    long times ago- 12 yrs ago. no meds   Diabetes mellitus    GERD (gastroesophageal reflux disease)    Hypertension     Past Surgical History:  Procedure Laterality Date   ABDOMINAL HYSTERECTOMY     total   COLONOSCOPY WITH PROPOFOL N/A 11/06/2015   Procedure: COLONOSCOPY WITH PROPOFOL;  Surgeon: Bernette Redbird, MD;  Location: [facility];  Service: Endoscopy;  Laterality: N/A;    Outpatient Medications Prior to Visit  Medication Sig Dispense Refill   atorvastatin (LIPITOR) 20 MG tablet Take 1 tablet (20 mg total) by mouth daily. 90 tablet 1   Cholecalciferol 50 MCG (2000 UT) CAPS Take 1 capsule by mouth daily.     Cyanocobalamin (B-12 PO) Take 5,000 mcg by mouth daily. B 12 liquid     gabapentin (NEURONTIN) 100 MG capsule Take 1-3 capsules (100-300 mg total) by mouth at bedtime as needed  (nerve pain). 90 capsule 3   hydrochlorothiazide (HYDRODIURIL) 25 MG tablet Take 1 tablet (25 mg total) by mouth daily. 30 tablet 1   Lactobacillus (PROBIOTIC ACIDOPHILUS PO) Take by mouth daily.     metFORMIN (GLUCOPHAGE) 1000 MG tablet Take 1 tablet (1,000 mg total) by mouth 2 (two) times daily with a meal. 180 tablet 1   Multiple Vitamins-Minerals (WOMENS MULTIVITAMIN PO) Take 1 tablet by mouth daily.     Omega-3 Fatty Acids (OMEGA 3 PO) Take 300 mg by mouth daily.     PAPAYA ENZYME PO Take 1 tablet by mouth daily.     VITAMIN A PO Take 2,400 mg by mouth daily.     Zinc 50 MG TABS Take 50 mg by mouth daily.     Fezolinetant (VEOZAH) 45 MG TABS Take 1 tablet (45 mg total) by mouth daily in the afternoon. 30 tablet 2   lisinopril (ZESTRIL) 10 MG tablet Take by mouth.     No facility-administered medications prior to visit.    Allergies  Allergen Reactions   Pollen Extract     06/20/2015 -        Objective:     Physical Exam   There were no vitals taken for this visit.  Wt Readings from Last 3 Encounters:  02/10/23 207 lb 4 oz (94 kg)  02/03/23 207 lb 3.2 oz (94 kg)  01/04/23 205  lb 3.2 oz (93.1 kg)       Assessment & Plan:   Problem List Items Addressed This Visit       Endocrine   Type 2 diabetes mellitus without complication, without long-term current use of insulin (HCC) - Primary   Other Visit Diagnoses     Long term current use of oral hypoglycemic drug          DM type 2 w/hyperglycemia-not controlled.  Patient on metformin 1000mg  twice daily.  Eating a lot of starch.  Discussed options w/daughter-sulfonyurea not good option, possibly SGLT-2.  Discussed Insulin(not best option).  Recommend GLP-1-SED and discussed how to take.   She will discuss with Mom and get back w/us.  My preference would be Mounjaro.  Of course, work on diet/exercise.  Needs to lose weight as well.    No orders of the defined types were placed in this encounter.   I discussed the  assessment and treatment plan with the patient. The patient was provided an opportunity to ask questions and all were answered. The patient agreed with the plan and demonstrated an understanding of the instructions.   The patient was advised to call back or seek an in-person evaluation if the symptoms worsen or if the condition fails to improve as anticipated.  No follow-ups on file.  Angelena Sole, MD [facility] [phone] (phone) [phone] (fax)  [facility]

## 2023-02-28 NOTE — Patient Instructions (Addendum)
GLP-1-weekly injections.  Mounjaro, Ozempic, Trulicity.    Side effects-nausea, diarrhea of constipation-would recommend colace 100-200 mg daily, tummy self-massage, and possibly miralax to prevent constipation  Other options-SGLT-2-like farxiga or Jardiance(need to wipe well after urination to prevent yeast)  Less favorable medications-glucatrol or amaryl.    Or insulin(once daily long acting)  Also, work on Conserv[employer], museum/gallery.   And exercise(walking).

## 2023-02-28 NOTE — Progress Notes (Signed)
Lumbar spine MRI does show some areas that could be injected.  You do have the facet arthritis we talked about.  Would you like me to order those injections now?

## 2023-03-01 ENCOUNTER — Encounter: Payer: Self-pay | Admitting: Family Medicine

## 2023-03-01 DIAGNOSIS — M47816 Spondylosis without myelopathy or radiculopathy, lumbar region: Secondary | ICD-10-CM

## 2023-03-01 DIAGNOSIS — M545 Low back pain, unspecified: Secondary | ICD-10-CM

## 2023-03-02 ENCOUNTER — Other Ambulatory Visit: Payer: Self-pay | Admitting: Family Medicine

## 2023-03-02 DIAGNOSIS — E119 Type 2 diabetes mellitus without complications: Secondary | ICD-10-CM

## 2023-03-02 MED ORDER — TIRZEPATIDE 2.5 MG/0.5ML ~~LOC~~ SOAJ
2.5000 mg | SUBCUTANEOUS | 0 refills | Status: DC
Start: 2023-03-02 — End: 2023-05-09

## 2023-03-07 ENCOUNTER — Other Ambulatory Visit (HOSPITAL_COMMUNITY): Payer: Self-pay

## 2023-03-07 ENCOUNTER — Telehealth: Payer: Self-pay

## 2023-03-07 NOTE — Telephone Encounter (Signed)
Patient Advocate Encounter   Received notification from [facility] that prior authorization is required for [facility] 2.5MG /0.5ML pen-injectors   Submitted: 03-07-2023 Key [id number]  Status is pending

## 2023-03-07 NOTE — Telephone Encounter (Signed)
PA has been DENIED:   Here are the policy requirements your request did not meet: Per your health plan's criteria, this drug is covered if you meet the following: If the request is for a non-preferred drug, you have tried two preferred drugs (or your doctor provides clinical reason why you cannot use the drugs): Byetta pen, Ozempic pen, Trulicity pen, Victoza pen. The information provided does not show that you meet the criteria listed above. Please speak with your doctor about your choices.

## 2023-03-11 ENCOUNTER — Other Ambulatory Visit: Payer: Self-pay | Admitting: Family Medicine

## 2023-03-11 NOTE — Discharge Instructions (Signed)

## 2023-03-14 ENCOUNTER — Inpatient Hospital Stay
Admission: RE | Admit: 2023-03-14 | Discharge: 2023-03-14 | Disposition: A | Discharge status: Home / Self Care | Payer: Medicaid Other | Source: Ambulatory Visit | Attending: Family Medicine | Admitting: Family Medicine

## 2023-03-14 ENCOUNTER — Other Ambulatory Visit: Payer: Medicaid Other

## 2023-03-25 ENCOUNTER — Ambulatory Visit: Payer: Medicaid Other | Attending: Family Medicine | Admitting: Physical Therapy

## 2023-03-25 ENCOUNTER — Other Ambulatory Visit: Payer: Self-pay

## 2023-03-25 DIAGNOSIS — M546 Pain in thoracic spine: Secondary | ICD-10-CM | POA: Insufficient documentation

## 2023-03-25 DIAGNOSIS — M6281 Muscle weakness (generalized): Secondary | ICD-10-CM | POA: Diagnosis present

## 2023-03-25 DIAGNOSIS — M79601 Pain in right arm: Secondary | ICD-10-CM | POA: Diagnosis not present

## 2023-03-25 DIAGNOSIS — R293 Abnormal posture: Secondary | ICD-10-CM | POA: Insufficient documentation

## 2023-03-25 DIAGNOSIS — M5459 Other low back pain: Secondary | ICD-10-CM | POA: Insufficient documentation

## 2023-03-25 NOTE — Therapy (Signed)
OUTPATIENT PHYSICAL THERAPY SHOULDER/BACK EVALUATION   Patient Name: KALAI TESH MRN: [id number] DOB:1960-12-15, 62 y.o., female Today's Date: 03/25/2023  END OF SESSION:  PT End of Session - 03/25/23 0934     Visit Number 1    Number of Visits 16    Date for PT Re-Evaluation 05/20/23    Authorization Type [employer] MCD - PSFS (has used 11 visits during previous PT episodes)    Authorization - Visit Number 11    Authorization - Number of Visits 27    PT Start Time 0933    PT Stop Time 1015    PT Time Calculation (min) 42 min    Activity Tolerance Patient tolerated treatment well             Past Medical History:  Diagnosis Date   Arthritis    Asthma    long times ago- 12 yrs ago. no meds   Diabetes mellitus    GERD (gastroesophageal reflux disease)    Hypertension    Past Surgical History:  Procedure Laterality Date   ABDOMINAL HYSTERECTOMY     total   COLONOSCOPY WITH PROPOFOL N/A 11/06/2015   Procedure: COLONOSCOPY WITH PROPOFOL;  Surgeon: Bernette Redbird, MD;  Location: [facility];  Service: Endoscopy;  Laterality: N/A;   Patient Active Problem List   Diagnosis Date Noted   Hyperlipidemia associated with type 2 diabetes mellitus (HCC) 02/10/2023   Local edema 02/10/2023   Irritable bowel syndrome with both constipation and diarrhea 11/14/2022   Chronic midline low back pain without sciatica 11/14/2022   Paresthesia 11/14/2022   Type 2 diabetes mellitus without complication, without long-term current use of insulin (HCC) 11/12/2022   HTN (hypertension) 10/20/2013   Morbid obesity (HCC) 10/20/2013    PCP: Jeani Sow, MD  REFERRING PROVIDER: Rodolph Bong, MD  REFERRING DIAG: [phone] (ICD-10-CM) - Right arm pain   THERAPY DIAG:  Muscle weakness  Abnormal posture  Pain in thoracic spine  Other low back pain  Rationale for Evaluation and Treatment: Rehabilitation  ONSET DATE: Exacerbated a few months ago; chronic back pain  SUBJECTIVE:                                                                                                                                                                                       SUBJECTIVE STATEMENT: Pt speaks [place]. Daughter is present to translate. Pt states her arm is feeling better since PT d/c for her low back pain; however, she would like to screen her UE and continue to address her back pain. Pt reports R shoulder hurts worse than L. Pt was supposed to do injection for her  back but has not had insurance approval. Pt states her back is better but still gets pain. Pt reports she isn't sure what causes the back pain. Pain comes intermittently. Pt states it's worse at night. Pt states pain can last for 3-4 hours (before it could last 2-3 days before completing her prior PT episode). Pt notes that doing stairs has been better. Feels her pain is more in the lower midback vs low back.  Hand dominance: Right  PERTINENT HISTORY: N/a  PAIN:  Are you having pain? Yes: NPRS scale: currently 0, at worst 10/10 Pain location: Middle midback Pain description: Pressure, stiff/tightness Aggravating factors: Unsure, intermittent pain Relieving factors: Uses topical cream, stretches  PRECAUTIONS: None  WEIGHT BEARING RESTRICTIONS: No  FALLS:  Has patient fallen in last 6 months? No  LIVING ENVIRONMENT: Lives with: lives with their family Lives in: House/apartment Stairs:  Flight of 8" steps Has following equipment at home: None  OCCUPATION: Retired  PLOF: Independent  PATIENT GOALS:Continue to improve back pain and arm pain  NEXT MD VISIT:   OBJECTIVE:   DIAGNOSTIC FINDINGS:  02/16/23 lumbar MRI IMPRESSION: Lower lumbar facet arthropathy (greatest and severe on the right at L5-S1). Mild bilateral foraminal stenosis at L4-L5. No significant canal stenosis.  PATIENT SURVEYS:  FOTO not indicated Modified Oswestry TBA next session  COGNITION: Overall cognitive status: Within  functional limits for tasks assessed     SENSATION: WFL  POSTURE: Increased thoracic kyphosis, rounded shoulders  UPPER EXTREMITY ROM: WFL  Active ROM Right eval Left eval  Shoulder flexion    Shoulder extension    Shoulder abduction    Shoulder adduction    Shoulder internal rotation    Shoulder external rotation    Elbow flexion    Elbow extension    Wrist flexion    Wrist extension    Wrist ulnar deviation    Wrist radial deviation    Wrist pronation    Wrist supination    (Blank rows = not tested)  UPPER EXTREMITY MMT:  MMT Right eval Left eval  Shoulder flexion 3+* 3+  Shoulder extension 4 4  Shoulder abduction 3+* 3+  Shoulder adduction    Shoulder internal rotation 4+ 4+  Shoulder external rotation 3+ 3+  Middle trapezius 3 3  Lower trapezius 2+ 2+  Elbow flexion    Elbow extension    Wrist flexion    Wrist extension    Wrist ulnar deviation    Wrist radial deviation    Wrist pronation    Wrist supination    Grip strength (lbs)    (Blank rows = not tested) * = concordant pain  SHOULDER SPECIAL TESTS: Impingement tests: Hawkins/Kennedy impingement test: negative and Painful arc test: negative SLAP lesions: Biceps load test: negative Instability tests:  did not assess Rotator cuff assessment: Empty can test: negative and Full can test: negative Biceps assessment: Speed's test: negative  JOINT MOBILITY TESTING:  WFL  PALPATION:  TTP thoracic to thoracolumbar paraspinals and periscapular muscles   TODAY'S TREATMENT:  DATE: 03/25/23 See HEP below   PATIENT EDUCATION: Education details: Exam findings, POC, initial HEP Person educated: Patient and Child(ren) Education method: Explanation, Demonstration, Tactile cues, and Handouts Education comprehension: verbalized understanding, returned demonstration, and  needs further education  HOME EXERCISE PROGRAM: Access Code: [email or web address] URL: [email or web address] Date: 03/25/2023 Prepared by: Vernon Prey April Kirstie Peri  Exercises - Shoulder External Rotation and Scapular Retraction with Resistance  - 1 x daily - 7 x weekly - 2 sets - 10 reps - 3 sec hold - Shoulder External Rotation in 45 Degrees Abduction  - 1 x daily - 7 x weekly - 2 sets - 10 reps - 3 sec hold - Shoulder Horizontal Abduction - Thumbs Up  - 1 x daily - 7 x weekly - 2 sets - 10 reps - 3 sec hold - Low Trap Setting at Wall  - 1 x daily - 7 x weekly - 2 sets - 10 reps  ASSESSMENT:  CLINICAL IMPRESSION: Patient is a 62 y.o. F who was seen today for physical therapy evaluation and treatment for R shoulder and midback pain. Assessment significant for very weak postural stabilizers (scapular and rotator cuff muscles) with decreased thoracolumbar paraspinal endurance likely leading to her continued and intermittent midback pain. Pt endorses she has kept up with some of her stretches from prior PT episode. Pt will benefit from PT to address these deficits to improve her level of function with all daily tasks.   OBJECTIVE IMPAIRMENTS: decreased activity tolerance, decreased endurance, decreased mobility, decreased strength, increased fascial restrictions, impaired flexibility, impaired UE functional use, improper body mechanics, postural dysfunction, and pain.   ACTIVITY LIMITATIONS: carrying, lifting, bending, squatting, sleeping, and stairs  PARTICIPATION LIMITATIONS: meal prep, cleaning, laundry, community activity, and yard work  PERSONAL FACTORS: Age, Fitness, Past/current experiences, and Time since onset of injury/illness/exacerbation are also affecting patient's functional outcome.   REHAB POTENTIAL: Good  CLINICAL DECISION MAKING: Evolving/moderate complexity  EVALUATION COMPLEXITY: Moderate   GOALS: Goals reviewed with patient? Yes  SHORT TERM GOALS: Target  date: 04/22/2023   Pt will be ind with initial HEP Baseline: Goal status: INITIAL  2.  Pt will report >/=25% improvement in pain Baseline:  Goal status: INITIAL    LONG TERM GOALS: Target date: 05/20/2023   Pt will be ind with management and progression of HEP Baseline:  Goal status: INITIAL  2.  Pt will demo at least 4/5 strength in mid/low trap for improved postural stability Baseline:  Goal status: INITIAL  3.  Pt will report >/=50% improvement in back and shoulder pain Baseline:  Goal status: INITIAL  4.  Pt will be able to lift and carry at least 5# overhead for kitchen tasks Baseline:  Goal status: INITIAL  5.  Pt will have at least 9 point difference in modified Oswestry to demo MCID Baseline:  Goal status: INITIAL   PLAN:  PT FREQUENCY: 2x/week  PT DURATION: 8 weeks  PLANNED INTERVENTIONS: Therapeutic exercises, Therapeutic activity, Neuromuscular re-education, Balance training, Gait training, Patient/Family education, Self Care, Joint mobilization, Stair training, Aquatic Therapy, Dry Needling, Electrical stimulation, Spinal mobilization, Cryotherapy, Moist heat, Taping, Vasopneumatic device, Ionotophoresis 4mg /ml Dexamethasone, Manual therapy, and Re-evaluation  PLAN FOR NEXT SESSION: Assess modified oswestry. Review HEP. Progress postural stabilization exercises.    Nellie Chevalier April Ma L Kathreen Dileo, PT 03/25/2023, 12:01 PM

## 2023-03-28 ENCOUNTER — Ambulatory Visit: Payer: Medicaid Other | Admitting: Physical Therapy

## 2023-03-28 DIAGNOSIS — M6281 Muscle weakness (generalized): Secondary | ICD-10-CM

## 2023-03-28 DIAGNOSIS — M5459 Other low back pain: Secondary | ICD-10-CM

## 2023-03-28 DIAGNOSIS — R293 Abnormal posture: Secondary | ICD-10-CM

## 2023-03-28 DIAGNOSIS — M546 Pain in thoracic spine: Secondary | ICD-10-CM

## 2023-03-28 NOTE — Therapy (Signed)
OUTPATIENT PHYSICAL THERAPY TREATMENT   Patient Name: Heidi Davis MRN: [id number] DOB:July 07, 1961, 62 y.o., female Today's Date: 03/28/2023  END OF SESSION:  PT End of Session - 03/28/23 0801     Visit Number 2    Number of Visits 16    Date for PT Re-Evaluation 05/20/23    Authorization Type [employer] MCD - PSFS (has used 11 visits during previous PT episodes)    Authorization - Visit Number 12    Authorization - Number of Visits 27    PT Start Time 0801    PT Stop Time 0845    PT Time Calculation (min) 44 min    Activity Tolerance Patient tolerated treatment well              Past Medical History:  Diagnosis Date   Arthritis    Asthma    long times ago- 12 yrs ago. no meds   Diabetes mellitus    GERD (gastroesophageal reflux disease)    Hypertension    Past Surgical History:  Procedure Laterality Date   ABDOMINAL HYSTERECTOMY     total   COLONOSCOPY WITH PROPOFOL N/A 11/06/2015   Procedure: COLONOSCOPY WITH PROPOFOL;  Surgeon: Bernette Redbird, MD;  Location: [facility];  Service: Endoscopy;  Laterality: N/A;   Patient Active Problem List   Diagnosis Date Noted   Hyperlipidemia associated with type 2 diabetes mellitus (HCC) 02/10/2023   Local edema 02/10/2023   Irritable bowel syndrome with both constipation and diarrhea 11/14/2022   Chronic midline low back pain without sciatica 11/14/2022   Paresthesia 11/14/2022   Type 2 diabetes mellitus without complication, without long-term current use of insulin (HCC) 11/12/2022   HTN (hypertension) 10/20/2013   Morbid obesity (HCC) 10/20/2013    PCP: Jeani Sow, MD  REFERRING PROVIDER: Rodolph Bong, MD  REFERRING DIAG: [phone] (ICD-10-CM) - Right arm pain   THERAPY DIAG:  Muscle weakness  Abnormal posture  Pain in thoracic spine  Other low back pain  Rationale for Evaluation and Treatment: Rehabilitation  ONSET DATE: Exacerbated a few months ago; chronic back pain  SUBJECTIVE:                                                                                                                                                                                       SUBJECTIVE STATEMENT: Pt states she is doing good this morning. She reports she has some pain this morning.   From eval: Pt speaks Arabic. Daughter is present to translate. Pt states her arm is feeling better since PT d/c for her low back pain; however, she would like to screen her UE and continue to  address her back pain. Pt reports R shoulder hurts worse than L. Pt was supposed to do injection for her back but has not had insurance approval. Pt states her back is better but still gets pain. Pt reports she isn't sure what causes the back pain. Pain comes intermittently. Pt states it's worse at night. Pt states pain can last for 3-4 hours (before it could last 2-3 days before completing her prior PT episode). Pt notes that doing stairs has been better. Feels her pain is more in the lower midback vs low back.  Hand dominance: Right  PERTINENT HISTORY: N/a  PAIN:  Are you having pain? Yes: NPRS scale: currently 7/10 Pain location: Middle midback Pain description: Pressure, stiff/tightness Aggravating factors: Unsure, intermittent pain Relieving factors: Uses topical cream, stretches  PRECAUTIONS: None  WEIGHT BEARING RESTRICTIONS: No  FALLS:  Has patient fallen in last 6 months? No  LIVING ENVIRONMENT: Lives with: lives with their family Lives in: House/apartment Stairs:  Flight of 8" steps Has following equipment at home: None  OCCUPATION: Retired  PATIENT GOALS:Continue to improve back pain and arm pain  NEXT MD VISIT:   OBJECTIVE: (Measures in this section from initial evaluation unless otherwise noted)  DIAGNOSTIC FINDINGS:  02/16/23 lumbar MRI IMPRESSION: Lower lumbar facet arthropathy (greatest and severe on the right at L5-S1). Mild bilateral foraminal stenosis at L4-L5. No significant canal  stenosis.  PATIENT SURVEYS:  FOTO not indicated Modified Oswestry 10/50 = 20%  POSTURE: Increased thoracic kyphosis, rounded shoulders  UPPER EXTREMITY ROM: WFL  Active ROM Right eval Left eval  Shoulder flexion    Shoulder extension    Shoulder abduction    Shoulder adduction    Shoulder internal rotation    Shoulder external rotation    Elbow flexion    Elbow extension    Wrist flexion    Wrist extension    Wrist ulnar deviation    Wrist radial deviation    Wrist pronation    Wrist supination    (Blank rows = not tested)  UPPER EXTREMITY MMT:  MMT Right eval Left eval  Shoulder flexion 3+* 3+  Shoulder extension 4 4  Shoulder abduction 3+* 3+  Shoulder adduction    Shoulder internal rotation 4+ 4+  Shoulder external rotation 3+ 3+  Middle trapezius 3 3  Lower trapezius 2+ 2+  Elbow flexion    Elbow extension    Wrist flexion    Wrist extension    Wrist ulnar deviation    Wrist radial deviation    Wrist pronation    Wrist supination    Grip strength (lbs)    (Blank rows = not tested) * = concordant pain  SHOULDER SPECIAL TESTS: Impingement tests: Hawkins/Kennedy impingement test: negative and Painful arc test: negative SLAP lesions: Biceps load test: negative Instability tests:  did not assess Rotator cuff assessment: Empty can test: negative and Full can test: negative Biceps assessment: Speed's test: negative  PALPATION:  TTP thoracic to thoracolumbar paraspinals and periscapular muscles   TODAY'S TREATMENT:  DATE:  03/28/23 Nustep L5 x 5 min UEs/LEs Manual therapy: STM & TPR  Quadruped Cat/cow 5x10 sec Thoracic rotation 5x5 sec Child's pose x30 sec Sitting Shoulder ER red TB 2x10 "W" 2x10 Shoulder abduction red TB 2x8 Standing Low trap setting 2x8 Row reactive iso red TB 2x10 Shoulder ext red TB  x10  03/25/23 See HEP below   PATIENT EDUCATION: Education details: Exam findings, POC, initial HEP Person educated: Patient and Child(ren) Education method: Explanation, Demonstration, Tactile cues, and Handouts Education comprehension: verbalized understanding, returned demonstration, and needs further education  HOME EXERCISE PROGRAM: Access Code: [email or web address] URL: [email or web address] Date: 03/25/2023 Prepared by: Vernon Prey April Kirstie Peri  Exercises - Shoulder External Rotation and Scapular Retraction with Resistance  - 1 x daily - 7 x weekly - 2 sets - 10 reps - 3 sec hold - Shoulder External Rotation in 45 Degrees Abduction  - 1 x daily - 7 x weekly - 2 sets - 10 reps - 3 sec hold - Shoulder Horizontal Abduction - Thumbs Up  - 1 x daily - 7 x weekly - 2 sets - 10 reps - 3 sec hold - Low Trap Setting at Wall  - 1 x daily - 7 x weekly - 2 sets - 10 reps  ASSESSMENT:  CLINICAL IMPRESSION: Treatment focused on reviewing HEP. Progressed scapular/midback strengthening with good tolerance. Hypomobile and tight along lower thoracic spine -- provided manual work to address.   From eval: Patient is a 62 y.o. F who was seen today for physical therapy evaluation and treatment for R shoulder and midback pain. Assessment significant for very weak postural stabilizers (scapular and rotator cuff muscles) with decreased thoracolumbar paraspinal endurance likely leading to her continued and intermittent midback pain. Pt endorses she has kept up with some of her stretches from prior PT episode. Pt will benefit from PT to address these deficits to improve her level of function with all daily tasks.   OBJECTIVE IMPAIRMENTS: decreased activity tolerance, decreased endurance, decreased mobility, decreased strength, increased fascial restrictions, impaired flexibility, impaired UE functional use, improper body mechanics, postural dysfunction, and pain.    GOALS: Goals reviewed with  patient? Yes  SHORT TERM GOALS: Target date: 04/22/2023   Pt will be ind with initial HEP Baseline: Goal status: INITIAL  2.  Pt will report >/=25% improvement in pain Baseline:  Goal status: INITIAL    LONG TERM GOALS: Target date: 05/20/2023   Pt will be ind with management and progression of HEP Baseline:  Goal status: INITIAL  2.  Pt will demo at least 4/5 strength in mid/low trap for improved postural stability Baseline:  Goal status: INITIAL  3.  Pt will report >/=50% improvement in back and shoulder pain Baseline:  Goal status: INITIAL  4.  Pt will be able to lift and carry at least 5# overhead for kitchen tasks Baseline:  Goal status: INITIAL  5.  Pt will have at least 9 point difference in modified Oswestry to demo MCID Baseline:  Goal status: INITIAL   PLAN:  PT FREQUENCY: 2x/week  PT DURATION: 8 weeks  PLANNED INTERVENTIONS: Therapeutic exercises, Therapeutic activity, Neuromuscular re-education, Balance training, Gait training, Patient/Family education, Self Care, Joint mobilization, Stair training, Aquatic Therapy, Dry Needling, Electrical stimulation, Spinal mobilization, Cryotherapy, Moist heat, Taping, Vasopneumatic device, Ionotophoresis 4mg /ml Dexamethasone, Manual therapy, and Re-evaluation  PLAN FOR NEXT SESSION: Assess modified oswestry. Review HEP. Progress postural stabilization exercises.    Shloma Roggenkamp April Ma L Jerrion Tabbert, PT 03/28/2023, 8:03 AM

## 2023-03-30 ENCOUNTER — Other Ambulatory Visit: Payer: Self-pay | Admitting: Family Medicine

## 2023-03-31 ENCOUNTER — Ambulatory Visit
Admission: RE | Admit: 2023-03-31 | Discharge: 2023-03-31 | Disposition: A | Discharge status: Home / Self Care | Payer: Medicaid Other | Source: Ambulatory Visit | Attending: Family Medicine | Admitting: Family Medicine

## 2023-03-31 DIAGNOSIS — G8929 Other chronic pain: Secondary | ICD-10-CM

## 2023-03-31 DIAGNOSIS — M47816 Spondylosis without myelopathy or radiculopathy, lumbar region: Secondary | ICD-10-CM

## 2023-03-31 DIAGNOSIS — M47897 Other spondylosis, lumbosacral region: Secondary | ICD-10-CM | POA: Diagnosis not present

## 2023-03-31 MED ORDER — METHYLPREDNISOLONE ACETATE 40 MG/ML INJ SUSP (RADIOLOG
80.0000 mg | Freq: Once | INTRAMUSCULAR | Status: AC
  Administered 2023-03-31: 80 mg via INTRA_ARTICULAR

## 2023-03-31 MED ORDER — LISINOPRIL 10 MG PO TABS: 10.0000 mg | ORAL_TABLET | Freq: Every day | ORAL | 0 refills | Status: DC

## 2023-03-31 MED ORDER — IOPAMIDOL (ISOVUE-M 200) INJECTION 41%
1.0000 mL | Freq: Once | INTRAMUSCULAR | Status: AC
  Administered 2023-03-31: 1 mL via INTRA_ARTICULAR

## 2023-03-31 NOTE — Telephone Encounter (Signed)
Left message for patient to return my call concerning refill request, need to confirm she is taking medication and which pharmacy.

## 2023-03-31 NOTE — Telephone Encounter (Signed)
Patient returned call. Requests to be called. 

## 2023-03-31 NOTE — Discharge Instructions (Signed)

## 2023-04-04 ENCOUNTER — Ambulatory Visit: Payer: Medicaid Other | Attending: Family Medicine | Admitting: Physical Therapy

## 2023-04-04 DIAGNOSIS — M5459 Other low back pain: Secondary | ICD-10-CM | POA: Diagnosis present

## 2023-04-04 DIAGNOSIS — M546 Pain in thoracic spine: Secondary | ICD-10-CM | POA: Diagnosis present

## 2023-04-04 DIAGNOSIS — R293 Abnormal posture: Secondary | ICD-10-CM

## 2023-04-04 DIAGNOSIS — M6281 Muscle weakness (generalized): Secondary | ICD-10-CM

## 2023-04-04 NOTE — Therapy (Signed)
OUTPATIENT PHYSICAL THERAPY TREATMENT   Patient Name: Heidi Davis MRN: [id number] DOB:Mar 16, 1961, 62 y.o., female Today's Date: 04/04/2023  END OF SESSION:  PT End of Session - 04/04/23 0846     Visit Number 3    Number of Visits 16    Date for PT Re-Evaluation 05/20/23    Authorization Type [employer] MCD - PSFS (has used 11 visits during previous PT episodes)    Authorization - Visit Number 13    Authorization - Number of Visits 27    PT Start Time [phone]    PT Stop Time 0925    PT Time Calculation (min) 39 min    Activity Tolerance Patient tolerated treatment well               Past Medical History:  Diagnosis Date   Arthritis    Asthma    long times ago- 12 yrs ago. no meds   Diabetes mellitus    GERD (gastroesophageal reflux disease)    Hypertension    Past Surgical History:  Procedure Laterality Date   ABDOMINAL HYSTERECTOMY     total   COLONOSCOPY WITH PROPOFOL N/A 11/06/2015   Procedure: COLONOSCOPY WITH PROPOFOL;  Surgeon: Bernette Redbird, MD;  Location: [facility];  Service: Endoscopy;  Laterality: N/A;   Patient Active Problem List   Diagnosis Date Noted   Hyperlipidemia associated with type 2 diabetes mellitus (HCC) 02/10/2023   Local edema 02/10/2023   Irritable bowel syndrome with both constipation and diarrhea 11/14/2022   Chronic midline low back pain without sciatica 11/14/2022   Paresthesia 11/14/2022   Type 2 diabetes mellitus without complication, without long-term current use of insulin (HCC) 11/12/2022   HTN (hypertension) 10/20/2013   Morbid obesity (HCC) 10/20/2013    PCP: Jeani Sow, MD  REFERRING PROVIDER: Rodolph Bong, MD  REFERRING DIAG: [id number] (ICD-10-CM) - Right arm pain   THERAPY DIAG:  Muscle weakness  Abnormal posture  Pain in thoracic spine  Other low back pain  Rationale for Evaluation and Treatment: Rehabilitation  ONSET DATE: Exacerbated a few months ago; chronic back pain  SUBJECTIVE:                                                                                                                                                                                       SUBJECTIVE STATEMENT: Pt reports no pain this morning but some yesterday.  Has not been able to do her exercises -- got her back injection last week.   From eval: Pt speaks Arabic. Daughter is present to translate. Pt states her arm is feeling better since PT d/c for her low back pain; however,  she would like to screen her UE and continue to address her back pain. Pt reports R shoulder hurts worse than L. Pt was supposed to do injection for her back but has not had insurance approval. Pt states her back is better but still gets pain. Pt reports she isn't sure what causes the back pain. Pain comes intermittently. Pt states it's worse at night. Pt states pain can last for 3-4 hours (before it could last 2-3 days before completing her prior PT episode). Pt notes that doing stairs has been better. Feels her pain is more in the lower midback vs low back.  Hand dominance: Right  PERTINENT HISTORY: N/a  PAIN:  Are you having pain? Yes: NPRS scale: currently 7/10 Pain location: Middle midback Pain description: Pressure, stiff/tightness Aggravating factors: Unsure, intermittent pain Relieving factors: Uses topical cream, stretches  PRECAUTIONS: None  WEIGHT BEARING RESTRICTIONS: No  FALLS:  Has patient fallen in last 6 months? No  LIVING ENVIRONMENT: Lives with: lives with their family Lives in: House/apartment Stairs:  Flight of 8" steps Has following equipment at home: None  OCCUPATION: Retired  PATIENT GOALS:Continue to improve back pain and arm pain  NEXT MD VISIT:   OBJECTIVE: (Measures in this section from initial evaluation unless otherwise noted)  DIAGNOSTIC FINDINGS:  02/16/23 lumbar MRI IMPRESSION: Lower lumbar facet arthropathy (greatest and severe on the right at L5-S1). Mild bilateral foraminal stenosis at  L4-L5. No significant canal stenosis.  PATIENT SURVEYS:  FOTO not indicated Modified Oswestry 10/50 = 20%  POSTURE: Increased thoracic kyphosis, rounded shoulders  UPPER EXTREMITY ROM: WFL  Active ROM Right eval Left eval  Shoulder flexion    Shoulder extension    Shoulder abduction    Shoulder adduction    Shoulder internal rotation    Shoulder external rotation    Elbow flexion    Elbow extension    Wrist flexion    Wrist extension    Wrist ulnar deviation    Wrist radial deviation    Wrist pronation    Wrist supination    (Blank rows = not tested)  UPPER EXTREMITY MMT:  MMT Right eval Left eval  Shoulder flexion 3+* 3+  Shoulder extension 4 4  Shoulder abduction 3+* 3+  Shoulder adduction    Shoulder internal rotation 4+ 4+  Shoulder external rotation 3+ 3+  Middle trapezius 3 3  Lower trapezius 2+ 2+  Elbow flexion    Elbow extension    Wrist flexion    Wrist extension    Wrist ulnar deviation    Wrist radial deviation    Wrist pronation    Wrist supination    Grip strength (lbs)    (Blank rows = not tested) * = concordant pain  SHOULDER SPECIAL TESTS: Impingement tests: Hawkins/Kennedy impingement test: negative and Painful arc test: negative SLAP lesions: Biceps load test: negative Instability tests:  did not assess Rotator cuff assessment: Empty can test: negative and Full can test: negative Biceps assessment: Speed's test: negative  PALPATION:  TTP thoracic to thoracolumbar paraspinals and periscapular muscles   TODAY'S TREATMENT:  DATE:  04/04/23 UBE L1, 2 min fwd, 2 min bwd Standing Doorway pec stretch 2x30 sec low, mid, high Shoulder ER red TB 2x10 "W" red TB 2x10 Horizontal abd red TB 2x10 Low trap setting 2x10 Shoulder ext green TB 2x10 Shoulder row green TB 2x10 Bow and arrow green TB  2x10 Shoulder flexion red TB 2x8 cues to decrease UT overactivation Shoulder abd red TB 2x10 Bent over row red TB 2x10 Quadruped Cat/cow x10 Thoracic rotation 5x5 sec Child's pose x30 sec   03/28/23 Nustep L5 x 5 min UEs/LEs Manual therapy: STM & TPR thoracic paraspinals Quadruped Cat/cow 5x10 sec Thoracic rotation 5x5 sec Child's pose x30 sec Sitting Shoulder ER red TB 2x10 "W" 2x10 Shoulder abduction red TB 2x8 Standing Low trap setting 2x8 Row reactive iso red TB 2x10 Shoulder ext red TB x10  03/25/23 See HEP below   PATIENT EDUCATION: Education details: Exam findings, POC, initial HEP Person educated: Patient and Child(ren) Education method: Explanation, Demonstration, Tactile cues, and Handouts Education comprehension: verbalized understanding, returned demonstration, and needs further education  HOME EXERCISE PROGRAM: Access Code: [email or web address] URL: [email or web address] Date: 03/25/2023 Prepared by: Vernon Prey April Kirstie Peri  Exercises - Shoulder External Rotation and Scapular Retraction with Resistance  - 1 x daily - 7 x weekly - 2 sets - 10 reps - 3 sec hold - Shoulder External Rotation in 45 Degrees Abduction  - 1 x daily - 7 x weekly - 2 sets - 10 reps - 3 sec hold - Shoulder Horizontal Abduction - Thumbs Up  - 1 x daily - 7 x weekly - 2 sets - 10 reps - 3 sec hold - Low Trap Setting at Wall  - 1 x daily - 7 x weekly - 2 sets - 10 reps  ASSESSMENT:  CLINICAL IMPRESSION: Reviewed HEP and progressed as tolerated. Able to go up to green resistance.   From eval: Patient is a 62 y.o. F who was seen today for physical therapy evaluation and treatment for R shoulder and midback pain. Assessment significant for very weak postural stabilizers (scapular and rotator cuff muscles) with decreased thoracolumbar paraspinal endurance likely leading to her continued and intermittent midback pain. Pt endorses she has kept up with some of her stretches from prior PT  episode. Pt will benefit from PT to address these deficits to improve her level of function with all daily tasks.   OBJECTIVE IMPAIRMENTS: decreased activity tolerance, decreased endurance, decreased mobility, decreased strength, increased fascial restrictions, impaired flexibility, impaired UE functional use, improper body mechanics, postural dysfunction, and pain.    GOALS: Goals reviewed with patient? Yes  SHORT TERM GOALS: Target date: 04/22/2023   Pt will be ind with initial HEP Baseline: Goal status: INITIAL  2.  Pt will report >/=25% improvement in pain Baseline:  Goal status: INITIAL    LONG TERM GOALS: Target date: 05/20/2023   Pt will be ind with management and progression of HEP Baseline:  Goal status: INITIAL  2.  Pt will demo at least 4/5 strength in mid/low trap for improved postural stability Baseline:  Goal status: INITIAL  3.  Pt will report >/=50% improvement in back and shoulder pain Baseline:  Goal status: INITIAL  4.  Pt will be able to lift and carry at least 5# overhead for kitchen tasks Baseline:  Goal status: INITIAL  5.  Pt will have at least 9 point difference in modified Oswestry to demo MCID Baseline:  Goal status: INITIAL   PLAN:  PT FREQUENCY: 2x/week  PT DURATION: 8 weeks  PLANNED INTERVENTIONS: Therapeutic exercises, Therapeutic activity, Neuromuscular re-education, Balance training, Gait training, Patient/Family education, Self Care, Joint mobilization, Stair training, Aquatic Therapy, Dry Needling, Electrical stimulation, Spinal mobilization, Cryotherapy, Moist heat, Taping, Vasopneumatic device, Ionotophoresis 4mg /ml Dexamethasone, Manual therapy, and Re-evaluation  PLAN FOR NEXT SESSION: Review HEP. Progress postural stabilization exercises.    Jamala Kohen April Ma L Jaidyn Kuhl, PT 04/04/2023, 8:46 AM

## 2023-04-20 ENCOUNTER — Encounter (INDEPENDENT_AMBULATORY_CARE_PROVIDER_SITE_OTHER): Payer: Self-pay

## 2023-04-22 ENCOUNTER — Ambulatory Visit: Payer: Medicaid Other | Attending: Family Medicine | Admitting: Physical Therapy

## 2023-04-22 DIAGNOSIS — M546 Pain in thoracic spine: Secondary | ICD-10-CM | POA: Diagnosis present

## 2023-04-22 DIAGNOSIS — M6281 Muscle weakness (generalized): Secondary | ICD-10-CM | POA: Insufficient documentation

## 2023-04-22 DIAGNOSIS — M5459 Other low back pain: Secondary | ICD-10-CM | POA: Diagnosis present

## 2023-04-22 DIAGNOSIS — R293 Abnormal posture: Secondary | ICD-10-CM | POA: Insufficient documentation

## 2023-04-22 NOTE — Therapy (Signed)
OUTPATIENT PHYSICAL THERAPY TREATMENT   Patient Name: Heidi Davis MRN: [id number] DOB:1961-01-21, 62 y.o., female Today's Date: 04/22/2023  END OF SESSION:  PT End of Session - 04/22/23 1020     Visit Number 4    Number of Visits 16    Date for PT Re-Evaluation 05/20/23    Authorization Type [employer] MCD - PSFS (has used 11 visits during previous PT episodes)    Authorization - Number of Visits 27    PT Start Time 1020    PT Stop Time 1100    PT Time Calculation (min) 40 min    Activity Tolerance Patient tolerated treatment well             Past Medical History:  Diagnosis Date   Arthritis    Asthma    long times ago- 12 yrs ago. no meds   Diabetes mellitus    GERD (gastroesophageal reflux disease)    Hypertension    Past Surgical History:  Procedure Laterality Date   ABDOMINAL HYSTERECTOMY     total   COLONOSCOPY WITH PROPOFOL N/A 11/06/2015   Procedure: COLONOSCOPY WITH PROPOFOL;  Surgeon: Bernette Redbird, MD;  Location: [facility];  Service: Endoscopy;  Laterality: N/A;   Patient Active Problem List   Diagnosis Date Noted   Hyperlipidemia associated with type 2 diabetes mellitus (HCC) 02/10/2023   Local edema 02/10/2023   Irritable bowel syndrome with both constipation and diarrhea 11/14/2022   Chronic midline low back pain without sciatica 11/14/2022   Paresthesia 11/14/2022   Type 2 diabetes mellitus without complication, without long-term current use of insulin (HCC) 11/12/2022   HTN (hypertension) 10/20/2013   Morbid obesity (HCC) 10/20/2013    PCP: Jeani Sow, MD  REFERRING PROVIDER: Rodolph Bong, MD  REFERRING DIAG: [phone] (ICD-10-CM) - Right arm pain   THERAPY DIAG:  Muscle weakness  Abnormal posture  Pain in thoracic spine  Other low back pain  Rationale for Evaluation and Treatment: Rehabilitation  ONSET DATE: Exacerbated a few months ago; chronic back pain  SUBJECTIVE:                                                                                                                                                                                       SUBJECTIVE STATEMENT: Son present to interpret. Pt reports she has been doing her exercises at home. Notes some pain this morning but nothing right now. Pt states she still will get pain randomly (she can be laying down and it happens). Mostly feels in the mornings and the evenings. Otherwise, pt reports she is able to go up/down the stairs without any pain now.  From eval: Pt speaks Arabic. Daughter is present to translate. Pt states her arm is feeling better since PT d/c for her low back pain; however, she would like to screen her UE and continue to address her back pain. Pt reports R shoulder hurts worse than L. Pt was supposed to do injection for her back but has not had insurance approval. Pt states her back is better but still gets pain. Pt reports she isn't sure what causes the back pain. Pain comes intermittently. Pt states it's worse at night. Pt states pain can last for 3-4 hours (before it could last 2-3 days before completing her prior PT episode). Pt notes that doing stairs has been better. Feels her pain is more in the lower midback vs low back.  Hand dominance: Right  PERTINENT HISTORY: N/a  PAIN:  Are you having pain? Yes: NPRS scale: currently 0/10 Pain location: Middle midback Pain description: Pressure, stiff/tightness Aggravating factors: Unsure, intermittent pain Relieving factors: Uses topical cream, stretches  PRECAUTIONS: None  WEIGHT BEARING RESTRICTIONS: No  FALLS:  Has patient fallen in last 6 months? No  LIVING ENVIRONMENT: Lives with: lives with their family Lives in: House/apartment Stairs:  Flight of 8" steps Has following equipment at home: None  OCCUPATION: Retired  PATIENT GOALS:Continue to improve back pain and arm pain  NEXT MD VISIT:   OBJECTIVE: (Measures in this section from initial evaluation unless otherwise  noted)  DIAGNOSTIC FINDINGS:  02/16/23 lumbar MRI IMPRESSION: Lower lumbar facet arthropathy (greatest and severe on the right at L5-S1). Mild bilateral foraminal stenosis at L4-L5. No significant canal stenosis.  PATIENT SURVEYS:  FOTO not indicated Modified Oswestry 10/50 = 20%  POSTURE: Increased thoracic kyphosis, rounded shoulders  UPPER EXTREMITY ROM: WFL  Active ROM Right eval Left eval  Shoulder flexion    Shoulder extension    Shoulder abduction    Shoulder adduction    Shoulder internal rotation    Shoulder external rotation    Elbow flexion    Elbow extension    Wrist flexion    Wrist extension    Wrist ulnar deviation    Wrist radial deviation    Wrist pronation    Wrist supination    (Blank rows = not tested)  UPPER EXTREMITY MMT:  MMT Right eval Left eval  Shoulder flexion 3+* 3+  Shoulder extension 4 4  Shoulder abduction 3+* 3+  Shoulder adduction    Shoulder internal rotation 4+ 4+  Shoulder external rotation 3+ 3+  Middle trapezius 3 3  Lower trapezius 2+ 2+  Elbow flexion    Elbow extension    Wrist flexion    Wrist extension    Wrist ulnar deviation    Wrist radial deviation    Wrist pronation    Wrist supination    Grip strength (lbs)    (Blank rows = not tested) * = concordant pain  SHOULDER SPECIAL TESTS: Impingement tests: Hawkins/Kennedy impingement test: negative and Painful arc test: negative SLAP lesions: Biceps load test: negative Instability tests:  did not assess Rotator cuff assessment: Empty can test: negative and Full can test: negative Biceps assessment: Speed's test: negative  PALPATION:  TTP thoracic to thoracolumbar paraspinals and periscapular muscles   TODAY'S TREATMENT:  DATE:  04/22/23 UBE L1.4, 2 min fwd, 2 min bwd Quadruped Cat/cow x10 into child's pose x5 Thoracic  rotation x10 Bird dog 2x10 Standing Shoulder ER green TB 2x10 "W" green TB 2x10 Horizontal abd green TB 2x10 Low trap setting 2x10 Diagonal chops green TB 2x10 Manual therapy STM & TPR thoracic paraspinals, bilat QL Grade II to III   04/04/23 UBE L1, 2 min fwd, 2 min bwd Standing Doorway pec stretch 2x30 sec low, mid, high Shoulder ER red TB 2x10 "W" red TB 2x10 Horizontal abd red TB 2x10 Low trap setting 2x10 Shoulder ext green TB 2x10 Shoulder row green TB 2x10 Bow and arrow green TB 2x10 Shoulder flexion red TB 2x8 cues to decrease UT overactivation Shoulder abd red TB 2x10 Bent over row red TB 2x10 Quadruped Cat/cow x10 Thoracic rotation 5x5 sec Child's pose x30 sec   03/28/23 Nustep L5 x 5 min UEs/LEs Manual therapy: STM & TPR thoracic paraspinals Quadruped Cat/cow 5x10 sec Thoracic rotation 5x5 sec Child's pose x30 sec Sitting Shoulder ER red TB 2x10 "W" 2x10 Shoulder abduction red TB 2x8 Standing Low trap setting 2x8 Row reactive iso red TB 2x10 Shoulder ext red TB x10  03/25/23 See HEP below   PATIENT EDUCATION: Education details: Exam findings, POC, initial HEP Person educated: Patient and Child(ren) Education method: Explanation, Demonstration, Tactile cues, and Handouts Education comprehension: verbalized understanding, returned demonstration, and needs further education  HOME EXERCISE PROGRAM: Access Code: [email or web address] URL: [email or web address] Date: 03/25/2023 Prepared by: Vernon Prey April Kirstie Peri  Exercises - Shoulder External Rotation and Scapular Retraction with Resistance  - 1 x daily - 7 x weekly - 2 sets - 10 reps - 3 sec hold - Shoulder External Rotation in 45 Degrees Abduction  - 1 x daily - 7 x weekly - 2 sets - 10 reps - 3 sec hold - Shoulder Horizontal Abduction - Thumbs Up  - 1 x daily - 7 x weekly - 2 sets - 10 reps - 3 sec hold - Low Trap Setting at Wall  - 1 x daily - 7 x weekly - 2 sets - 10  reps  ASSESSMENT:  CLINICAL IMPRESSION: Pt continues to demo improving strength. Performed all midback exercises with green TB today. Worked on full spinal stability with bird dogs (pt challenged with this). Added diagonal chops to continue to work on trunk strength and stability.   From eval: Patient is a 62 y.o. F who was seen today for physical therapy evaluation and treatment for R shoulder and midback pain. Assessment significant for very weak postural stabilizers (scapular and rotator cuff muscles) with decreased thoracolumbar paraspinal endurance likely leading to her continued and intermittent midback pain. Pt endorses she has kept up with some of her stretches from prior PT episode. Pt will benefit from PT to address these deficits to improve her level of function with all daily tasks.   OBJECTIVE IMPAIRMENTS: decreased activity tolerance, decreased endurance, decreased mobility, decreased strength, increased fascial restrictions, impaired flexibility, impaired UE functional use, improper body mechanics, postural dysfunction, and pain.    GOALS: Goals reviewed with patient? Yes  SHORT TERM GOALS: Target date: 04/22/2023   Pt will be ind with initial HEP Baseline: Goal status: MET  2.  Pt will report >/=25% improvement in pain Baseline:  04/22/23: 80-90% per pt report Goal status: MET    LONG TERM GOALS: Target date: 05/20/2023   Pt will be ind with management and progression of HEP Baseline:  Goal status: INITIAL  2.  Pt will demo at least 4/5 strength in mid/low trap for improved postural stability Baseline:  Goal status: INITIAL  3.  Pt will report >/=50% improvement in back and shoulder pain Baseline:  04/22/23: 80-90% per pt report Goal status: MET  4.  Pt will be able to lift and carry at least 5# overhead for kitchen tasks Baseline:  Goal status: INITIAL  5.  Pt will have at least 9 point difference in modified Oswestry to demo MCID Baseline:  Goal status:  INITIAL   PLAN:  PT FREQUENCY: 2x/week  PT DURATION: 8 weeks  PLANNED INTERVENTIONS: Therapeutic exercises, Therapeutic activity, Neuromuscular re-education, Balance training, Gait training, Patient/Family education, Self Care, Joint mobilization, Stair training, Aquatic Therapy, Dry Needling, Electrical stimulation, Spinal mobilization, Cryotherapy, Moist heat, Taping, Vasopneumatic device, Ionotophoresis 4mg /ml Dexamethasone, Manual therapy, and Re-evaluation  PLAN FOR NEXT SESSION: Review HEP. Progress postural stabilization exercises.    Saisha Hogue April Ma L Demitris Pokorny, PT 04/22/2023, 10:20 AM

## 2023-04-25 ENCOUNTER — Ambulatory Visit: Payer: Medicaid Other | Admitting: Physical Therapy

## 2023-04-25 DIAGNOSIS — R293 Abnormal posture: Secondary | ICD-10-CM

## 2023-04-25 DIAGNOSIS — M6281 Muscle weakness (generalized): Secondary | ICD-10-CM | POA: Diagnosis not present

## 2023-04-25 DIAGNOSIS — M5459 Other low back pain: Secondary | ICD-10-CM

## 2023-04-25 DIAGNOSIS — M546 Pain in thoracic spine: Secondary | ICD-10-CM

## 2023-04-25 NOTE — Therapy (Signed)
OUTPATIENT PHYSICAL THERAPY TREATMENT   Patient Name: ELLESSE SOPER MRN: [id number] DOB:June 08, 1961, 62 y.o., female Today's Date: 04/25/2023  END OF SESSION:  PT End of Session - 04/25/23 0803     Visit Number 5    Number of Visits 16    Date for PT Re-Evaluation 05/20/23    Authorization Type [employer] MCD - PSFS (has used 11 visits during previous PT episodes)    Authorization - Visit Number 15    Authorization - Number of Visits 27    PT Start Time 0800    PT Stop Time 0840    PT Time Calculation (min) 40 min    Activity Tolerance Patient tolerated treatment well             Past Medical History:  Diagnosis Date   Arthritis    Asthma    long times ago- 12 yrs ago. no meds   Diabetes mellitus    GERD (gastroesophageal reflux disease)    Hypertension    Past Surgical History:  Procedure Laterality Date   ABDOMINAL HYSTERECTOMY     total   COLONOSCOPY WITH PROPOFOL N/A 11/06/2015   Procedure: COLONOSCOPY WITH PROPOFOL;  Surgeon: Bernette Redbird, MD;  Location: [facility];  Service: Endoscopy;  Laterality: N/A;   Patient Active Problem List   Diagnosis Date Noted   Hyperlipidemia associated with type 2 diabetes mellitus (HCC) 02/10/2023   Local edema 02/10/2023   Irritable bowel syndrome with both constipation and diarrhea 11/14/2022   Chronic midline low back pain without sciatica 11/14/2022   Paresthesia 11/14/2022   Type 2 diabetes mellitus without complication, without long-term current use of insulin (HCC) 11/12/2022   HTN (hypertension) 10/20/2013   Morbid obesity (HCC) 10/20/2013    PCP: Jeani Sow, MD  REFERRING PROVIDER: Rodolph Bong, MD  REFERRING DIAG: [phone] (ICD-10-CM) - Right arm pain   THERAPY DIAG:  Muscle weakness  Abnormal posture  Pain in thoracic spine  Other low back pain  Rationale for Evaluation and Treatment: Rehabilitation  ONSET DATE: Exacerbated a few months ago; chronic back pain  SUBJECTIVE:                                                                                                                                                                                       SUBJECTIVE STATEMENT: Daughter present to interpret. Pt states she is feeling okay this morning. Had some soreness after last session  From eval: Pt speaks Arabic. Daughter is present to translate. Pt states her arm is feeling better since PT d/c for her low back pain; however, she would like to screen her UE and continue to  address her back pain. Pt reports R shoulder hurts worse than L. Pt was supposed to do injection for her back but has not had insurance approval. Pt states her back is better but still gets pain. Pt reports she isn't sure what causes the back pain. Pain comes intermittently. Pt states it's worse at night. Pt states pain can last for 3-4 hours (before it could last 2-3 days before completing her prior PT episode). Pt notes that doing stairs has been better. Feels her pain is more in the lower midback vs low back.  Hand dominance: Right  PERTINENT HISTORY: N/a  PAIN:  Are you having pain? Yes: NPRS scale: currently 0/10 Pain location: Middle midback Pain description: Pressure, stiff/tightness Aggravating factors: Unsure, intermittent pain Relieving factors: Uses topical cream, stretches  PRECAUTIONS: None  WEIGHT BEARING RESTRICTIONS: No  FALLS:  Has patient fallen in last 6 months? No  LIVING ENVIRONMENT: Lives with: lives with their family Lives in: House/apartment Stairs:  Flight of 8" steps Has following equipment at home: None  OCCUPATION: Retired  PATIENT GOALS:Continue to improve back pain and arm pain  NEXT MD VISIT:   OBJECTIVE: (Measures in this section from initial evaluation unless otherwise noted)  DIAGNOSTIC FINDINGS:  02/16/23 lumbar MRI IMPRESSION: Lower lumbar facet arthropathy (greatest and severe on the right at L5-S1). Mild bilateral foraminal stenosis at L4-L5. No  significant canal stenosis.  PATIENT SURVEYS:  FOTO not indicated Modified Oswestry 10/50 = 20%  POSTURE: Increased thoracic kyphosis, rounded shoulders  UPPER EXTREMITY ROM: WFL  UPPER EXTREMITY MMT:  MMT Right eval Left eval  Shoulder flexion 3+* 3+  Shoulder extension 4 4  Shoulder abduction 3+* 3+  Shoulder adduction    Shoulder internal rotation 4+ 4+  Shoulder external rotation 3+ 3+  Middle trapezius 3 3  Lower trapezius 2+ 2+  Elbow flexion    Elbow extension    Wrist flexion    Wrist extension    Wrist ulnar deviation    Wrist radial deviation    Wrist pronation    Wrist supination    Grip strength (lbs)    (Blank rows = not tested) * = concordant pain  SHOULDER SPECIAL TESTS: Impingement tests: Hawkins/Kennedy impingement test: negative and Painful arc test: negative SLAP lesions: Biceps load test: negative Instability tests:  did not assess Rotator cuff assessment: Empty can test: negative and Full can test: negative Biceps assessment: Speed's test: negative  PALPATION:  TTP thoracic to thoracolumbar paraspinals and periscapular muscles   TODAY'S TREATMENT:                                                                                                                                         DATE:  04/25/23 UBE L2, 2 min fwd, 2 min bwd Quadruped Cat/cow into child's pose x10 Thoracic rotation x10 Bird dog 2x10 Standing PPT + Wall squat x10 Sitting on pball  PPT x10 LAQ 2x10 Marching 2x10 Diagonals with weighted ball 2x10 Circles x10 Clockwise and counter clockwise   04/22/23 UBE L1.4, 2 min fwd, 2 min bwd Quadruped Cat/cow x10 into child's pose x5 Thoracic rotation x10 Bird dog 2x10 Standing Shoulder ER green TB 2x10 "W" green TB 2x10 Horizontal abd green TB 2x10 Low trap setting 2x10 Diagonal chops green TB 2x10 Manual therapy STM & TPR thoracic paraspinals, bilat QL Grade II to III   04/04/23 UBE L1, 2 min fwd, 2 min  bwd Standing Doorway pec stretch 2x30 sec low, mid, high Shoulder ER red TB 2x10 "W" red TB 2x10 Horizontal abd red TB 2x10 Low trap setting 2x10 Shoulder ext green TB 2x10 Shoulder row green TB 2x10 Bow and arrow green TB 2x10 Shoulder flexion red TB 2x8 cues to decrease UT overactivation Shoulder abd red TB 2x10 Bent over row red TB 2x10 Quadruped Cat/cow x10 Thoracic rotation 5x5 sec Child's pose x30 sec   03/28/23 Nustep L5 x 5 min UEs/LEs Manual therapy: STM & TPR thoracic paraspinals Quadruped Cat/cow 5x10 sec Thoracic rotation 5x5 sec Child's pose x30 sec Sitting Shoulder ER red TB 2x10 "W" 2x10 Shoulder abduction red TB 2x8 Standing Low trap setting 2x8 Row reactive iso red TB 2x10 Shoulder ext red TB x10  03/25/23 See HEP below   PATIENT EDUCATION: Education details: Exam findings, POC, initial HEP Person educated: Patient and Child(ren) Education method: Explanation, Demonstration, Tactile cues, and Handouts Education comprehension: verbalized understanding, returned demonstration, and needs further education  HOME EXERCISE PROGRAM: Access Code: [email or web address] URL: [email or web address] Date: 04/25/2023 Prepared by: Vernon Prey April Kirstie Peri  Exercises - Shoulder External Rotation and Scapular Retraction with Resistance  - 1 x daily - 7 x weekly - 2 sets - 10 reps - 3 sec hold - Low Trap Setting at Wall  - 1 x daily - 7 x weekly - 2 sets - 10 reps - Cat Cow to Child's Pose  - 1 x daily - 7 x weekly - 1 sets - 5 reps - 5 sec hold - Quadruped Full Range Thoracic Rotation with Reach  - 1 x daily - 7 x weekly - 1 sets - 5 reps - 5 sec hold - Shoulder W - External Rotation with Resistance  - 1 x daily - 7 x weekly - 3 sets - 10 reps - Standing Shoulder Horizontal Abduction with Resistance  - 1 x daily - 7 x weekly - 3 sets - 10 reps - Bird Dog  - 1 x daily - 7 x weekly - 2 sets - 10 reps - Standing Diagonal Lift with Anchored Resistance  - 1 x daily  - 7 x weekly - 2 sets - 10 reps  ASSESSMENT:  CLINICAL IMPRESSION: Reviewed form with bird dog - pt tends to extend lumbar spine. Requires cues to maintain a flat back. Worked on continued core/trunk stability sitting on pball today with good pt tolerance.   From eval: Patient is a 62 y.o. F who was seen today for physical therapy evaluation and treatment for R shoulder and midback pain. Assessment significant for very weak postural stabilizers (scapular and rotator cuff muscles) with decreased thoracolumbar paraspinal endurance likely leading to her continued and intermittent midback pain. Pt endorses she has kept up with some of her stretches from prior PT episode. Pt will benefit from PT to address these deficits to improve her level of function with all daily tasks.   OBJECTIVE IMPAIRMENTS: decreased activity tolerance, decreased endurance, decreased  mobility, decreased strength, increased fascial restrictions, impaired flexibility, impaired UE functional use, improper body mechanics, postural dysfunction, and pain.    GOALS: Goals reviewed with patient? Yes  SHORT TERM GOALS: Target date: 04/22/2023   Pt will be ind with initial HEP Baseline: Goal status: MET  2.  Pt will report >/=25% improvement in pain Baseline:  04/22/23: 80-90% per pt report Goal status: MET    LONG TERM GOALS: Target date: 05/20/2023   Pt will be ind with management and progression of HEP Baseline:  Goal status: INITIAL  2.  Pt will demo at least 4/5 strength in mid/low trap for improved postural stability Baseline:  Goal status: INITIAL  3.  Pt will report >/=50% improvement in back and shoulder pain Baseline:  04/22/23: 80-90% per pt report Goal status: MET  4.  Pt will be able to lift and carry at least 5# overhead for kitchen tasks Baseline:  Goal status: INITIAL  5.  Pt will have at least 9 point difference in modified Oswestry to demo MCID Baseline:  Goal status: INITIAL   PLAN:  PT  FREQUENCY: 2x/week  PT DURATION: 8 weeks  PLANNED INTERVENTIONS: Therapeutic exercises, Therapeutic activity, Neuromuscular re-education, Balance training, Gait training, Patient/Family education, Self Care, Joint mobilization, Stair training, Aquatic Therapy, Dry Needling, Electrical stimulation, Spinal mobilization, Cryotherapy, Moist heat, Taping, Vasopneumatic device, Ionotophoresis 4mg /ml Dexamethasone, Manual therapy, and Re-evaluation  PLAN FOR NEXT SESSION: Review HEP. Progress postural stabilization exercises. Manual work for thoracolumbar paraspinals as indicated.    Jairus Tonne April Ma L Kayston Jodoin, PT 04/25/2023, 8:04 AM

## 2023-04-29 ENCOUNTER — Ambulatory Visit: Payer: Medicaid Other | Admitting: Physical Therapy

## 2023-04-29 DIAGNOSIS — M6281 Muscle weakness (generalized): Secondary | ICD-10-CM | POA: Diagnosis not present

## 2023-04-29 DIAGNOSIS — M546 Pain in thoracic spine: Secondary | ICD-10-CM

## 2023-04-29 DIAGNOSIS — M5459 Other low back pain: Secondary | ICD-10-CM

## 2023-04-29 DIAGNOSIS — R293 Abnormal posture: Secondary | ICD-10-CM

## 2023-04-29 NOTE — Therapy (Signed)
OUTPATIENT PHYSICAL THERAPY TREATMENT   Patient Name: Heidi Davis MRN: [id number] DOB:01/02/1961, 62 y.o., female Today's Date: 04/29/2023  END OF SESSION:  PT End of Session - 04/29/23 1022     Visit Number 6    Number of Visits 16    Date for PT Re-Evaluation 05/20/23    Authorization Type [employer] MCD - PSFS (has used 11 visits during previous PT episodes)    Authorization - Visit Number 16    Authorization - Number of Visits 27    PT Start Time 1022    PT Stop Time 1100    PT Time Calculation (min) 38 min    Activity Tolerance Patient tolerated treatment well              Past Medical History:  Diagnosis Date   Arthritis    Asthma    long times ago- 12 yrs ago. no meds   Diabetes mellitus    GERD (gastroesophageal reflux disease)    Hypertension    Past Surgical History:  Procedure Laterality Date   ABDOMINAL HYSTERECTOMY     total   COLONOSCOPY WITH PROPOFOL N/A 11/06/2015   Procedure: COLONOSCOPY WITH PROPOFOL;  Surgeon: Bernette Redbird, MD;  Location: [facility];  Service: Endoscopy;  Laterality: N/A;   Patient Active Problem List   Diagnosis Date Noted   Hyperlipidemia associated with type 2 diabetes mellitus (HCC) 02/10/2023   Local edema 02/10/2023   Irritable bowel syndrome with both constipation and diarrhea 11/14/2022   Chronic midline low back pain without sciatica 11/14/2022   Paresthesia 11/14/2022   Type 2 diabetes mellitus without complication, without long-term current use of insulin (HCC) 11/12/2022   HTN (hypertension) 10/20/2013   Morbid obesity (HCC) 10/20/2013    PCP: Jeani Sow, MD  REFERRING PROVIDER: Rodolph Bong, MD  REFERRING DIAG: [phone] (ICD-10-CM) - Right arm pain   THERAPY DIAG:  Muscle weakness  Abnormal posture  Pain in thoracic spine  Other low back pain  Rationale for Evaluation and Treatment: Rehabilitation  ONSET DATE: Exacerbated a few months ago; chronic back pain  SUBJECTIVE:                                                                                                                                                                                       SUBJECTIVE STATEMENT: Daughter present to interpret. Pt states she is doing good. Has been consistent with her HEP. Pt states she does get sore with the exercise.   From eval: Pt speaks Arabic. Daughter is present to translate. Pt states her arm is feeling better since PT d/c for her low back pain; however, she  would like to screen her UE and continue to address her back pain. Pt reports R shoulder hurts worse than L. Pt was supposed to do injection for her back but has not had insurance approval. Pt states her back is better but still gets pain. Pt reports she isn't sure what causes the back pain. Pain comes intermittently. Pt states it's worse at night. Pt states pain can last for 3-4 hours (before it could last 2-3 days before completing her prior PT episode). Pt notes that doing stairs has been better. Feels her pain is more in the lower midback vs low back.  Hand dominance: Right  PERTINENT HISTORY: N/a  PAIN:  Are you having pain? Yes: NPRS scale: currently 0/10 Pain location: Middle midback Pain description: Pressure, stiff/tightness Aggravating factors: Unsure, intermittent pain Relieving factors: Uses topical cream, stretches  PRECAUTIONS: None  WEIGHT BEARING RESTRICTIONS: No  FALLS:  Has patient fallen in last 6 months? No  LIVING ENVIRONMENT: Lives with: lives with their family Lives in: House/apartment Stairs:  Flight of 8" steps Has following equipment at home: None  OCCUPATION: Retired  PATIENT GOALS:Continue to improve back pain and arm pain  NEXT MD VISIT:   OBJECTIVE: (Measures in this section from initial evaluation unless otherwise noted)  DIAGNOSTIC FINDINGS:  02/16/23 lumbar MRI IMPRESSION: Lower lumbar facet arthropathy (greatest and severe on the right at L5-S1). Mild bilateral foraminal  stenosis at L4-L5. No significant canal stenosis.  PATIENT SURVEYS:  FOTO not indicated Modified Oswestry 10/50 = 20%  POSTURE: Increased thoracic kyphosis, rounded shoulders  UPPER EXTREMITY ROM: WFL  UPPER EXTREMITY MMT:  MMT Right eval Left eval  Shoulder flexion 3+* 3+  Shoulder extension 4 4  Shoulder abduction 3+* 3+  Shoulder adduction    Shoulder internal rotation 4+ 4+  Shoulder external rotation 3+ 3+  Middle trapezius 3 3  Lower trapezius 2+ 2+  Elbow flexion    Elbow extension    Wrist flexion    Wrist extension    Wrist ulnar deviation    Wrist radial deviation    Wrist pronation    Wrist supination    Grip strength (lbs)    (Blank rows = not tested) * = concordant pain  SHOULDER SPECIAL TESTS: Impingement tests: Hawkins/Kennedy impingement test: negative and Painful arc test: negative SLAP lesions: Biceps load test: negative Instability tests:  did not assess Rotator cuff assessment: Empty can test: negative and Full can test: negative Biceps assessment: Speed's test: negative  PALPATION:  TTP thoracic to thoracolumbar paraspinals and periscapular muscles   TODAY'S TREATMENT:                                                                                                                                         DATE:  04/29/23 UBE L3, 2 min fwd, 2 min bwd Quadruped Cat/cow into child's pose x10 Thoracic rotation x10 Bird dog x10,  yellow TB x10 Standing PPT + wall squat yellow TB around knees 2x10 Sitting on pball PPT x10 LAQ 2x10 Marching 2x10 Hip flexion iso with alternating arm press down on ball 2x10  04/25/23 UBE L2, 2 min fwd, 2 min bwd Quadruped Cat/cow into child's pose x10 Thoracic rotation x10 Bird dog 2x10 Standing PPT + Wall squat x10 Sitting on pball PPT x10 LAQ 2x10 Marching 2x10 Diagonals with weighted ball 2x10 Circles x10 Clockwise and counter clockwise   04/22/23 UBE L1.4, 2 min fwd, 2 min bwd Quadruped Cat/cow  x10 into child's pose x5 Thoracic rotation x10 Bird dog 2x10 Standing Shoulder ER green TB 2x10 "W" green TB 2x10 Horizontal abd green TB 2x10 Low trap setting 2x10 Diagonal chops green TB 2x10 Manual therapy STM & TPR thoracic paraspinals, bilat QL Grade II to III     PATIENT EDUCATION: Education details: Exam findings, POC, initial HEP Person educated: Patient and Child(ren) Education method: Explanation, Demonstration, Tactile cues, and Handouts Education comprehension: verbalized understanding, returned demonstration, and needs further education  HOME EXERCISE PROGRAM: Access Code: [email or web address] URL: [email or web address] Date: 04/25/2023 Prepared by: Vernon Prey April Kirstie Peri  Exercises - Shoulder External Rotation and Scapular Retraction with Resistance  - 1 x daily - 7 x weekly - 2 sets - 10 reps - 3 sec hold - Low Trap Setting at Wall  - 1 x daily - 7 x weekly - 2 sets - 10 reps - Cat Cow to Child's Pose  - 1 x daily - 7 x weekly - 1 sets - 5 reps - 5 sec hold - Quadruped Full Range Thoracic Rotation with Reach  - 1 x daily - 7 x weekly - 1 sets - 5 reps - 5 sec hold - Shoulder W - External Rotation with Resistance  - 1 x daily - 7 x weekly - 3 sets - 10 reps - Standing Shoulder Horizontal Abduction with Resistance  - 1 x daily - 7 x weekly - 3 sets - 10 reps - Bird Dog  - 1 x daily - 7 x weekly - 2 sets - 10 reps - Standing Diagonal Lift with Anchored Resistance  - 1 x daily - 7 x weekly - 2 sets - 10 reps  ASSESSMENT:  CLINICAL IMPRESSION: Able to progress bird dog to with resistance this session. Improved stability with performing exercises on pball. Remains most unstable on pball when elevating L LE. Pt continues to progress well with her strengthening.   From eval: Patient is a 62 y.o. F who was seen today for physical therapy evaluation and treatment for R shoulder and midback pain. Assessment significant for very weak postural stabilizers (scapular  and rotator cuff muscles) with decreased thoracolumbar paraspinal endurance likely leading to her continued and intermittent midback pain. Pt endorses she has kept up with some of her stretches from prior PT episode. Pt will benefit from PT to address these deficits to improve her level of function with all daily tasks.   OBJECTIVE IMPAIRMENTS: decreased activity tolerance, decreased endurance, decreased mobility, decreased strength, increased fascial restrictions, impaired flexibility, impaired UE functional use, improper body mechanics, postural dysfunction, and pain.    GOALS: Goals reviewed with patient? Yes  SHORT TERM GOALS: Target date: 04/22/2023   Pt will be ind with initial HEP Baseline: Goal status: MET  2.  Pt will report >/=25% improvement in pain Baseline:  04/22/23: 80-90% per pt report Goal status: MET    LONG TERM GOALS: Target date: 05/20/2023   Pt  will be ind with management and progression of HEP Baseline:  Goal status: INITIAL  2.  Pt will demo at least 4/5 strength in mid/low trap for improved postural stability Baseline:  Goal status: INITIAL  3.  Pt will report >/=50% improvement in back and shoulder pain Baseline:  04/22/23: 80-90% per pt report Goal status: MET  4.  Pt will be able to lift and carry at least 5# overhead for kitchen tasks Baseline:  Goal status: INITIAL  5.  Pt will have at least 9 point difference in modified Oswestry to demo MCID Baseline:  Goal status: INITIAL   PLAN:  PT FREQUENCY: 2x/week  PT DURATION: 8 weeks  PLANNED INTERVENTIONS: Therapeutic exercises, Therapeutic activity, Neuromuscular re-education, Balance training, Gait training, Patient/Family education, Self Care, Joint mobilization, Stair training, Aquatic Therapy, Dry Needling, Electrical stimulation, Spinal mobilization, Cryotherapy, Moist heat, Taping, Vasopneumatic device, Ionotophoresis 4mg /ml Dexamethasone, Manual therapy, and Re-evaluation  PLAN FOR NEXT  SESSION: Review HEP. Progress postural stabilization exercises. Manual work for thoracolumbar paraspinals as indicated.    Evan Mackie April Ma L Alexandria Shiflett, PT 04/29/2023, 10:29 AM

## 2023-04-29 NOTE — Therapy (Deleted)
OUTPATIENT PHYSICAL THERAPY TREATMENT   Patient Name: Heidi Davis MRN: [id number] DOB:1961-07-13, 62 y.o., female Today's Date: 04/29/2023  END OF SESSION:    Past Medical History:  Diagnosis Date   Arthritis    Asthma    long times ago- 12 yrs ago. no meds   Diabetes mellitus    GERD (gastroesophageal reflux disease)    Hypertension    Past Surgical History:  Procedure Laterality Date   ABDOMINAL HYSTERECTOMY     total   COLONOSCOPY WITH PROPOFOL N/A 11/06/2015   Procedure: COLONOSCOPY WITH PROPOFOL;  Surgeon: Bernette Redbird, MD;  Location: [facility];  Service: Endoscopy;  Laterality: N/A;   Patient Active Problem List   Diagnosis Date Noted   Hyperlipidemia associated with type 2 diabetes mellitus (HCC) 02/10/2023   Local edema 02/10/2023   Irritable bowel syndrome with both constipation and diarrhea 11/14/2022   Chronic midline low back pain without sciatica 11/14/2022   Paresthesia 11/14/2022   Type 2 diabetes mellitus without complication, without long-term current use of insulin (HCC) 11/12/2022   HTN (hypertension) 10/20/2013   Morbid obesity (HCC) 10/20/2013    PCP: Jeani Sow, MD  REFERRING PROVIDER: Rodolph Bong, MD  REFERRING DIAG: [phone] (ICD-10-CM) - Right arm pain   THERAPY DIAG:  No diagnosis found.  Rationale for Evaluation and Treatment: Rehabilitation  ONSET DATE: Exacerbated a few months ago; chronic back pain  SUBJECTIVE:                                                                                                                                                                                      SUBJECTIVE STATEMENT: *** Daughter present to interpret. Pt states she is feeling okay this morning. Had some soreness after last session  From eval: Pt speaks Arabic. Daughter is present to translate. Pt states her arm is feeling better since PT d/c for her low back pain; however, she would like to screen her UE and continue to  address her back pain. Pt reports R shoulder hurts worse than L. Pt was supposed to do injection for her back but has not had insurance approval. Pt states her back is better but still gets pain. Pt reports she isn't sure what causes the back pain. Pain comes intermittently. Pt states it's worse at night. Pt states pain can last for 3-4 hours (before it could last 2-3 days before completing her prior PT episode). Pt notes that doing stairs has been better. Feels her pain is more in the lower midback vs low back.  Hand dominance: Right  PERTINENT HISTORY: N/a  PAIN:  Are you having pain? Yes: NPRS scale: currently 0/10  Pain location: Middle midback Pain description: Pressure, stiff/tightness Aggravating factors: Unsure, intermittent pain Relieving factors: Uses topical cream, stretches  PRECAUTIONS: None  WEIGHT BEARING RESTRICTIONS: No  FALLS:  Has patient fallen in last 6 months? No  LIVING ENVIRONMENT: Lives with: lives with their family Lives in: House/apartment Stairs:  Flight of 8" steps Has following equipment at home: None  OCCUPATION: Retired  PATIENT GOALS:Continue to improve back pain and arm pain  NEXT MD VISIT:   OBJECTIVE: (Measures in this section from initial evaluation unless otherwise noted)  DIAGNOSTIC FINDINGS:  02/16/23 lumbar MRI IMPRESSION: Lower lumbar facet arthropathy (greatest and severe on the right at L5-S1). Mild bilateral foraminal stenosis at L4-L5. No significant canal stenosis.  PATIENT SURVEYS:  FOTO not indicated Modified Oswestry 10/50 = 20%  POSTURE: Increased thoracic kyphosis, rounded shoulders  UPPER EXTREMITY ROM: WFL  UPPER EXTREMITY MMT:  MMT Right eval Left eval  Shoulder flexion 3+* 3+  Shoulder extension 4 4  Shoulder abduction 3+* 3+  Shoulder adduction    Shoulder internal rotation 4+ 4+  Shoulder external rotation 3+ 3+  Middle trapezius 3 3  Lower trapezius 2+ 2+  Elbow flexion    Elbow extension     Wrist flexion    Wrist extension    Wrist ulnar deviation    Wrist radial deviation    Wrist pronation    Wrist supination    Grip strength (lbs)    (Blank rows = not tested) * = concordant pain  SHOULDER SPECIAL TESTS: Impingement tests: Hawkins/Kennedy impingement test: negative and Painful arc test: negative SLAP lesions: Biceps load test: negative Instability tests:  did not assess Rotator cuff assessment: Empty can test: negative and Full can test: negative Biceps assessment: Speed's test: negative  PALPATION:  TTP thoracic to thoracolumbar paraspinals and periscapular muscles   TODAY'S TREATMENT:                                                                                                                                         DATE:  04/29/23 ***  04/25/23 UBE L2, 2 min fwd, 2 min bwd Quadruped Cat/cow into child's pose x10 Thoracic rotation x10 Bird dog 2x10 Standing PPT + Wall squat x10 Sitting on pball PPT x10 LAQ 2x10 Marching 2x10 Diagonals with weighted ball 2x10 Circles x10 Clockwise and counter clockwise   04/22/23 UBE L1.4, 2 min fwd, 2 min bwd Quadruped Cat/cow x10 into child's pose x5 Thoracic rotation x10 Bird dog 2x10 Standing Shoulder ER green TB 2x10 "W" green TB 2x10 Horizontal abd green TB 2x10 Low trap setting 2x10 Diagonal chops green TB 2x10 Manual therapy STM & TPR thoracic paraspinals, bilat QL Grade II to III     PATIENT EDUCATION: Education details: Exam findings, POC, initial HEP Person educated: Patient and Child(ren) Education method: Explanation, Demonstration, Tactile cues, and Handouts Education comprehension: verbalized understanding, returned demonstration, and needs further  education  HOME EXERCISE PROGRAM: Access Code: [email or web address] URL: [email or web address] Date: 04/25/2023 Prepared by: Vernon Prey April Kirstie Peri  Exercises - Shoulder External Rotation and Scapular Retraction with Resistance  - 1  x daily - 7 x weekly - 2 sets - 10 reps - 3 sec hold - Low Trap Setting at Wall  - 1 x daily - 7 x weekly - 2 sets - 10 reps - Cat Cow to Child's Pose  - 1 x daily - 7 x weekly - 1 sets - 5 reps - 5 sec hold - Quadruped Full Range Thoracic Rotation with Reach  - 1 x daily - 7 x weekly - 1 sets - 5 reps - 5 sec hold - Shoulder W - External Rotation with Resistance  - 1 x daily - 7 x weekly - 3 sets - 10 reps - Standing Shoulder Horizontal Abduction with Resistance  - 1 x daily - 7 x weekly - 3 sets - 10 reps - Bird Dog  - 1 x daily - 7 x weekly - 2 sets - 10 reps - Standing Diagonal Lift with Anchored Resistance  - 1 x daily - 7 x weekly - 2 sets - 10 reps  ASSESSMENT:  CLINICAL IMPRESSION: *** Reviewed form with bird dog - pt tends to extend lumbar spine. Requires cues to maintain a flat back. Worked on continued core/trunk stability sitting on pball today with good pt tolerance.   From eval: Patient is a 62 y.o. F who was seen today for physical therapy evaluation and treatment for R shoulder and midback pain. Assessment significant for very weak postural stabilizers (scapular and rotator cuff muscles) with decreased thoracolumbar paraspinal endurance likely leading to her continued and intermittent midback pain. Pt endorses she has kept up with some of her stretches from prior PT episode. Pt will benefit from PT to address these deficits to improve her level of function with all daily tasks.   OBJECTIVE IMPAIRMENTS: decreased activity tolerance, decreased endurance, decreased mobility, decreased strength, increased fascial restrictions, impaired flexibility, impaired UE functional use, improper body mechanics, postural dysfunction, and pain.    GOALS: Goals reviewed with patient? Yes  SHORT TERM GOALS: Target date: 04/22/2023   Pt will be ind with initial HEP Baseline: Goal status: MET  2.  Pt will report >/=25% improvement in pain Baseline:  04/22/23: 80-90% per pt report Goal status:  MET    LONG TERM GOALS: Target date: 05/20/2023   Pt will be ind with management and progression of HEP Baseline:  Goal status: INITIAL  2.  Pt will demo at least 4/5 strength in mid/low trap for improved postural stability Baseline:  Goal status: INITIAL  3.  Pt will report >/=50% improvement in back and shoulder pain Baseline:  04/22/23: 80-90% per pt report Goal status: MET  4.  Pt will be able to lift and carry at least 5# overhead for kitchen tasks Baseline:  Goal status: INITIAL  5.  Pt will have at least 9 point difference in modified Oswestry to demo MCID Baseline:  Goal status: INITIAL   PLAN:  PT FREQUENCY: 2x/week  PT DURATION: 8 weeks  PLANNED INTERVENTIONS: Therapeutic exercises, Therapeutic activity, Neuromuscular re-education, Balance training, Gait training, Patient/Family education, Self Care, Joint mobilization, Stair training, Aquatic Therapy, Dry Needling, Electrical stimulation, Spinal mobilization, Cryotherapy, Moist heat, Taping, Vasopneumatic device, Ionotophoresis 4mg /ml Dexamethasone, Manual therapy, and Re-evaluation  PLAN FOR NEXT SESSION: Review HEP. Progress postural stabilization exercises. Manual work for thoracolumbar paraspinals as indicated.   April Ma L , PT 04/29/2023, 7:50 AM

## 2023-05-02 ENCOUNTER — Ambulatory Visit: Payer: Medicaid Other | Admitting: Physical Therapy

## 2023-05-02 DIAGNOSIS — M6281 Muscle weakness (generalized): Secondary | ICD-10-CM | POA: Diagnosis not present

## 2023-05-02 DIAGNOSIS — R293 Abnormal posture: Secondary | ICD-10-CM

## 2023-05-02 DIAGNOSIS — M546 Pain in thoracic spine: Secondary | ICD-10-CM

## 2023-05-02 DIAGNOSIS — M5459 Other low back pain: Secondary | ICD-10-CM

## 2023-05-02 NOTE — Therapy (Signed)
OUTPATIENT PHYSICAL THERAPY TREATMENT   Patient Name: MANDA SHOUSE MRN: [id number] DOB:1961-07-18, 62 y.o., female Today's Date: 05/02/2023  END OF SESSION:  PT End of Session - 05/02/23 0802     Visit Number 7    Number of Visits 16    Date for PT Re-Evaluation 05/20/23    Authorization Type [employer] MCD - PSFS (has used 11 visits during previous PT episodes)    Authorization - Number of Visits 27    PT Start Time 0803    PT Stop Time 0845    PT Time Calculation (min) 42 min    Activity Tolerance Patient tolerated treatment well             Past Medical History:  Diagnosis Date   Arthritis    Asthma    long times ago- 12 yrs ago. no meds   Diabetes mellitus    GERD (gastroesophageal reflux disease)    Hypertension    Past Surgical History:  Procedure Laterality Date   ABDOMINAL HYSTERECTOMY     total   COLONOSCOPY WITH PROPOFOL N/A 11/06/2015   Procedure: COLONOSCOPY WITH PROPOFOL;  Surgeon: Bernette Redbird, MD;  Location: [facility];  Service: Endoscopy;  Laterality: N/A;   Patient Active Problem List   Diagnosis Date Noted   Hyperlipidemia associated with type 2 diabetes mellitus (HCC) 02/10/2023   Local edema 02/10/2023   Irritable bowel syndrome with both constipation and diarrhea 11/14/2022   Chronic midline low back pain without sciatica 11/14/2022   Paresthesia 11/14/2022   Type 2 diabetes mellitus without complication, without long-term current use of insulin (HCC) 11/12/2022   HTN (hypertension) 10/20/2013   Morbid obesity (HCC) 10/20/2013    PCP: Jeani Sow, MD  REFERRING PROVIDER: Rodolph Bong, MD  REFERRING DIAG: [phone] (ICD-10-CM) - Right arm pain   THERAPY DIAG:  Muscle weakness  Abnormal posture  Pain in thoracic spine  Other low back pain  Rationale for Evaluation and Treatment: Rehabilitation  ONSET DATE: Exacerbated a few months ago; chronic back pain  SUBJECTIVE:                                                                                                                                                                                       SUBJECTIVE STATEMENT: Son present to interpret. Pt states she had a good weekend. She was also pretty good after last PT session. Reports no pain this morning.   From eval: Pt speaks Arabic. Daughter is present to translate. Pt states her arm is feeling better since PT d/c for her low back pain; however, she would like to screen her UE and continue to  address her back pain. Pt reports R shoulder hurts worse than L. Pt was supposed to do injection for her back but has not had insurance approval. Pt states her back is better but still gets pain. Pt reports she isn't sure what causes the back pain. Pain comes intermittently. Pt states it's worse at night. Pt states pain can last for 3-4 hours (before it could last 2-3 days before completing her prior PT episode). Pt notes that doing stairs has been better. Feels her pain is more in the lower midback vs low back.  Hand dominance: Right  PERTINENT HISTORY: N/a  PAIN:  Are you having pain? Yes: NPRS scale: currently 0/10 Pain location: Middle midback Pain description: Pressure, stiff/tightness Aggravating factors: Unsure, intermittent pain Relieving factors: Uses topical cream, stretches  PRECAUTIONS: None  WEIGHT BEARING RESTRICTIONS: No  FALLS:  Has patient fallen in last 6 months? No  LIVING ENVIRONMENT: Lives with: lives with their family Lives in: House/apartment Stairs:  Flight of 8" steps Has following equipment at home: None  OCCUPATION: Retired  PATIENT GOALS:Continue to improve back pain and arm pain  NEXT MD VISIT:   OBJECTIVE: (Measures in this section from initial evaluation unless otherwise noted)  DIAGNOSTIC FINDINGS:  02/16/23 lumbar MRI IMPRESSION: Lower lumbar facet arthropathy (greatest and severe on the right at L5-S1). Mild bilateral foraminal stenosis at L4-L5. No significant canal  stenosis.  PATIENT SURVEYS:  FOTO not indicated Modified Oswestry 10/50 = 20%  POSTURE: Increased thoracic kyphosis, rounded shoulders  UPPER EXTREMITY ROM: WFL  UPPER EXTREMITY MMT:  MMT Right eval Left eval  Shoulder flexion 3+* 3+  Shoulder extension 4 4  Shoulder abduction 3+* 3+  Shoulder adduction    Shoulder internal rotation 4+ 4+  Shoulder external rotation 3+ 3+  Middle trapezius 3 3  Lower trapezius 2+ 2+  Elbow flexion    Elbow extension    Wrist flexion    Wrist extension    Wrist ulnar deviation    Wrist radial deviation    Wrist pronation    Wrist supination    Grip strength (lbs)    (Blank rows = not tested) * = concordant pain  SHOULDER SPECIAL TESTS: Impingement tests: Hawkins/Kennedy impingement test: negative and Painful arc test: negative SLAP lesions: Biceps load test: negative Instability tests:  did not assess Rotator cuff assessment: Empty can test: negative and Full can test: negative Biceps assessment: Speed's test: negative  PALPATION:  TTP thoracic to thoracolumbar paraspinals and periscapular muscles   TODAY'S TREATMENT:                                                                                                                                         DATE:  05/02/23 UBE L3, 2 min fwd, 2 min bwd Quadruped Cat/cow into child's pose x10 Thoracic rotation x10 R&L Bird dog red TB 2x10 Row red TB 2x10 Horizontal shoulder  abd red TB 2x10 "Y" red TB 2x10 Seated Lat pull down machine 50# 2x10 Standing Shoulder ext cables 25# 2x10 Shoulder Flexion Serratus Activation with Resistance red TB 2x10 Shoulder ER red TB 2x10  Overhead carry elbows bent 5# in each hand x 2 lap Overhead carry elbow straight 5# in each hand x 1 lap Farmer's carry 5# in each hand x 2 laps Goblet carry 15# KB x2 laps Step up 6" step 15# KB x10  04/29/23 UBE L3, 2 min fwd, 2 min bwd Quadruped Cat/cow into child's pose x10 Thoracic rotation x10 Bird dog  x10, yellow TB x10 Standing PPT + wall squat yellow TB around knees 2x10 Sitting on pball PPT x10 LAQ 2x10 Marching 2x10 Hip flexion iso with alternating arm press down on ball 2x10  04/25/23 UBE L2, 2 min fwd, 2 min bwd Quadruped Cat/cow into child's pose x10 Thoracic rotation x10 Bird dog 2x10 Standing PPT + Wall squat x10 Sitting on pball PPT x10 LAQ 2x10 Marching 2x10 Diagonals with weighted ball 2x10 Circles x10 Clockwise and counter clockwise    PATIENT EDUCATION: Education details: Exam findings, POC, initial HEP Person educated: Patient and Child(ren) Education method: Explanation, Demonstration, Tactile cues, and Handouts Education comprehension: verbalized understanding, returned demonstration, and needs further education  HOME EXERCISE PROGRAM: Access Code: [email or web address] URL: [email or web address] Date: 04/25/2023 Prepared by: Vernon Prey April Kirstie Peri  Exercises - Shoulder External Rotation and Scapular Retraction with Resistance  - 1 x daily - 7 x weekly - 2 sets - 10 reps - 3 sec hold - Low Trap Setting at Wall  - 1 x daily - 7 x weekly - 2 sets - 10 reps - Cat Cow to Child's Pose  - 1 x daily - 7 x weekly - 1 sets - 5 reps - 5 sec hold - Quadruped Full Range Thoracic Rotation with Reach  - 1 x daily - 7 x weekly - 1 sets - 5 reps - 5 sec hold - Shoulder W - External Rotation with Resistance  - 1 x daily - 7 x weekly - 3 sets - 10 reps - Standing Shoulder Horizontal Abduction with Resistance  - 1 x daily - 7 x weekly - 3 sets - 10 reps - Bird Dog  - 1 x daily - 7 x weekly - 2 sets - 10 reps - Standing Diagonal Lift with Anchored Resistance  - 1 x daily - 7 x weekly - 2 sets - 10 reps  ASSESSMENT:  CLINICAL IMPRESSION: Pt able to complete lifting and carrying tasks meeting one of her LTGs. Pt is demonstrating great increase in her midback musculature. Remains weakest with overhead movements but this continues to improve as well. Pt is close to  meeting all of her LTGs.   From eval: Patient is a 62 y.o. F who was seen today for physical therapy evaluation and treatment for R shoulder and midback pain. Assessment significant for very weak postural stabilizers (scapular and rotator cuff muscles) with decreased thoracolumbar paraspinal endurance likely leading to her continued and intermittent midback pain. Pt endorses she has kept up with some of her stretches from prior PT episode. Pt will benefit from PT to address these deficits to improve her level of function with all daily tasks.   OBJECTIVE IMPAIRMENTS: decreased activity tolerance, decreased endurance, decreased mobility, decreased strength, increased fascial restrictions, impaired flexibility, impaired UE functional use, improper body mechanics, postural dysfunction, and pain.    GOALS: Goals reviewed with patient? Yes  SHORT TERM  GOALS: Target date: 04/22/2023   Pt will be ind with initial HEP Baseline: Goal status: MET  2.  Pt will report >/=25% improvement in pain Baseline:  04/22/23: 80-90% per pt report Goal status: MET    LONG TERM GOALS: Target date: 05/20/2023   Pt will be ind with management and progression of HEP Baseline:  Goal status: INITIAL  2.  Pt will demo at least 4/5 strength in mid/low trap for improved postural stability Baseline:  Goal status: INITIAL  3.  Pt will report >/=50% improvement in back and shoulder pain Baseline:  04/22/23: 80-90% per pt report Goal status: MET  4.  Pt will be able to lift and carry at least 5# overhead for kitchen tasks Baseline:  Goal status: MET 05/02/23  5.  Pt will have at least 9 point difference in modified Oswestry to demo MCID Baseline:  Goal status: INITIAL   PLAN:  PT FREQUENCY: 2x/week  PT DURATION: 8 weeks  PLANNED INTERVENTIONS: Therapeutic exercises, Therapeutic activity, Neuromuscular re-education, Balance training, Gait training, Patient/Family education, Self Care, Joint mobilization,  Stair training, Aquatic Therapy, Dry Needling, Electrical stimulation, Spinal mobilization, Cryotherapy, Moist heat, Taping, Vasopneumatic device, Ionotophoresis 4mg /ml Dexamethasone, Manual therapy, and Re-evaluation  PLAN FOR NEXT SESSION: Review HEP. Progress postural stabilization exercises. Manual work for thoracolumbar paraspinals as indicated.    Eliya Geiman April Ma L Toree Edling, PT 05/02/2023, 8:03 AM

## 2023-05-06 ENCOUNTER — Ambulatory Visit: Payer: Medicaid Other | Admitting: Physical Therapy

## 2023-05-06 DIAGNOSIS — M6281 Muscle weakness (generalized): Secondary | ICD-10-CM | POA: Diagnosis not present

## 2023-05-06 DIAGNOSIS — M546 Pain in thoracic spine: Secondary | ICD-10-CM

## 2023-05-06 DIAGNOSIS — R293 Abnormal posture: Secondary | ICD-10-CM

## 2023-05-06 DIAGNOSIS — M5459 Other low back pain: Secondary | ICD-10-CM

## 2023-05-06 NOTE — Therapy (Addendum)
OUTPATIENT PHYSICAL THERAPY TREATMENT   Patient Name: Heidi Davis MRN: [id number] DOB:[birth year], 62 y.o., female Today's Date: 05/06/2023  END OF SESSION:  PT End of Session - 05/06/23 0801     Visit Number 8    Number of Visits 16    Date for PT Re-Evaluation 05/20/23    Authorization Type [employer] MCD - PSFS (has used 11 visits during previous PT episodes)    Authorization - Number of Visits 27    PT Start Time 0801    PT Stop Time 0840    PT Time Calculation (min) 39 min    Activity Tolerance Patient tolerated treatment well             Past Medical History:  Diagnosis Date   Arthritis    Asthma    long times ago- 12 yrs ago. no meds   Diabetes mellitus    GERD (gastroesophageal reflux disease)    Hypertension    Past Surgical History:  Procedure Laterality Date   ABDOMINAL HYSTERECTOMY     total   COLONOSCOPY WITH PROPOFOL N/A 11/06/2015   Procedure: COLONOSCOPY WITH PROPOFOL;  Surgeon: Bernette Redbird, MD;  Location: [facility];  Service: Endoscopy;  Laterality: N/A;   Patient Active Problem List   Diagnosis Date Noted   Hyperlipidemia associated with type 2 diabetes mellitus (HCC) 02/10/2023   Local edema 02/10/2023   Irritable bowel syndrome with both constipation and diarrhea 11/14/2022   Chronic midline low back pain without sciatica 11/14/2022   Paresthesia 11/14/2022   Type 2 diabetes mellitus without complication, without long-term current use of insulin (HCC) 11/12/2022   HTN (hypertension) 10/20/2013   Morbid obesity (HCC) 10/20/2013    PCP: Jeani Sow, MD  REFERRING PROVIDER: Rodolph Bong, MD  REFERRING DIAG: [phone] (ICD-10-CM) - Right arm pain   THERAPY DIAG:  Muscle weakness  Abnormal posture  Pain in thoracic spine  Other low back pain  Rationale for Evaluation and Treatment: Rehabilitation  ONSET DATE: Exacerbated a few months ago; chronic back pain  SUBJECTIVE:                                                                                                                                                                                       SUBJECTIVE STATEMENT: Pt states she's doing good this morning. No new complaints. Pt reports a little pain this morning and occasional back pain still; however, overall she is doing much better.   From eval: Pt speaks Arabic. Daughter is present to translate. Pt states her arm is feeling better since PT d/c for her low back pain; however, she would like to screen her  UE and continue to address her back pain. Pt reports R shoulder hurts worse than L. Pt was supposed to do injection for her back but has not had insurance approval. Pt states her back is better but still gets pain. Pt reports she isn't sure what causes the back pain. Pain comes intermittently. Pt states it's worse at night. Pt states pain can last for 3-4 hours (before it could last 2-3 days before completing her prior PT episode). Pt notes that doing stairs has been better. Feels her pain is more in the lower midback vs low back.  Hand dominance: Right  PERTINENT HISTORY: N/a  PAIN:  Are you having pain? Yes: NPRS scale: currently 0/10 Pain location: Middle midback Pain description: Pressure, stiff/tightness Aggravating factors: Unsure, intermittent pain Relieving factors: Uses topical cream, stretches  PRECAUTIONS: None  WEIGHT BEARING RESTRICTIONS: No  FALLS:  Has patient fallen in last 6 months? No  LIVING ENVIRONMENT: Lives with: lives with their family Lives in: House/apartment Stairs:  Flight of 8" steps Has following equipment at home: None  OCCUPATION: Retired  PATIENT GOALS:Continue to improve back pain and arm pain  NEXT MD VISIT:   OBJECTIVE: (Measures in this section from initial evaluation unless otherwise noted)  DIAGNOSTIC FINDINGS:  02/16/23 lumbar MRI IMPRESSION: Lower lumbar facet arthropathy (greatest and severe on the right at L5-S1). Mild bilateral foraminal  stenosis at L4-L5. No significant canal stenosis.  PATIENT SURVEYS:  FOTO not indicated Modified Oswestry eval: 10/50 = 20%; 05/06/23: 10/50 = 20%  POSTURE: Increased thoracic kyphosis, rounded shoulders  UPPER EXTREMITY ROM: WFL  UPPER EXTREMITY MMT:  MMT Right eval Left eval Right 05/06/23 Left 05/06/23  Shoulder flexion 3+* 3+ 4 4  Shoulder extension 4 4 4  4-  Shoulder abduction 3+* 3+ 4 4*  Shoulder adduction      Shoulder internal rotation 4+ 4+ 5 5  Shoulder external rotation 3+ 3+ 5 5  Middle trapezius 3 3 4  3+  Lower trapezius 2+ 2+ 3+ 3  Elbow flexion      Elbow extension      Wrist flexion      Wrist extension      Wrist ulnar deviation      Wrist radial deviation      Wrist pronation      Wrist supination      Grip strength (lbs)      (Blank rows = not tested) * = concordant pain  SHOULDER SPECIAL TESTS: Impingement tests: Hawkins/Kennedy impingement test: negative and Painful arc test: negative SLAP lesions: Biceps load test: negative Instability tests:  did not assess Rotator cuff assessment: Empty can test: negative and Full can test: negative Biceps assessment: Speed's test: negative  PALPATION:  TTP thoracic to thoracolumbar paraspinals and periscapular muscles   TODAY'S TREATMENT:  DATE:  05/06/23 UBE L4, 2 min fwd, 2 min bwd Low trap setting 2x10 Scapular clock with red TB 2x10 Shoulder extension blue TB 2x10 Re-checked pt's goals  05/02/23 UBE L3, 2 min fwd, 2 min bwd Quadruped Cat/cow into child's pose x10 Thoracic rotation x10 R&L Bird dog red TB 2x10 Row red TB 2x10 Horizontal shoulder abd red TB 2x10 "Y" red TB 2x10 Seated Lat pull down machine 50# 2x10 Standing Shoulder ext cables 25# 2x10 Shoulder Flexion Serratus Activation with Resistance red TB 2x10 Shoulder ER red TB 2x10  Overhead carry  elbows bent 5# in each hand x 2 lap Overhead carry elbow straight 5# in each hand x 1 lap Farmer's carry 5# in each hand x 2 laps Goblet carry 15# KB x2 laps Step up 6" step 15# KB x10  04/29/23 UBE L3, 2 min fwd, 2 min bwd Quadruped Cat/cow into child's pose x10 Thoracic rotation x10 Bird dog x10, yellow TB x10 Standing PPT + wall squat yellow TB around knees 2x10 Sitting on pball PPT x10 LAQ 2x10 Marching 2x10 Hip flexion iso with alternating arm press down on ball 2x10     PATIENT EDUCATION: Education details: Exam findings, POC, initial HEP Person educated: Patient and Child(ren) Education method: Explanation, Demonstration, Tactile cues, and Handouts Education comprehension: verbalized understanding, returned demonstration, and needs further education  HOME EXERCISE PROGRAM: Access Code: [email or web address] URL: [email or web address] Date: 05/06/2023 Prepared by: Vernon Prey April Kirstie Peri  Exercises - Shoulder External Rotation and Scapular Retraction with Resistance  - 1 x daily - 7 x weekly - 2 sets - 10 reps - 3 sec hold - Cat Cow to Child's Pose  - 1 x daily - 7 x weekly - 1 sets - 5 reps - 5 sec hold - Quadruped Full Range Thoracic Rotation with Reach  - 1 x daily - 7 x weekly - 1 sets - 5 reps - 5 sec hold - Shoulder W - External Rotation with Resistance  - 1 x daily - 7 x weekly - 3 sets - 10 reps - Standing Shoulder Horizontal Abduction with Resistance  - 1 x daily - 7 x weekly - 3 sets - 10 reps - Standing Diagonal Lift with Anchored Resistance  - 1 x daily - 7 x weekly - 2 sets - 10 reps - Bird Dog with Resistance  - 1 x daily - 7 x weekly - 2 sets - 10 reps - Low Trap Setting at Wall  - 1 x daily - 7 x weekly - 2 sets - 10 reps - Standing Low Trap Setting with Resistance at Wall  - 1 x daily - 7 x weekly - 2 sets - 10 reps - Standing Plank on Wall with Reaches and Resistance  - 1 x daily - 7 x weekly - 2 sets - 10 reps - Shoulder extension with  resistance - Neutral  - 1 x daily - 7 x weekly - 2 sets - 10 reps  ASSESSMENT:  CLINICAL IMPRESSION: Rechecked pt's strength -- improved but not yet to goal level. Remains weakest with middle and lower trap L worse than R. Modified Oswestry score does not seem to have changed; however, pt is reporting subjective improvements and decrease in R arm pain. Pt's rotator cuff muscle have improved in strength but mid back is still slightly weaker. Pt will benefit from continued therapy; however, has HEP at home to continue her strengthening.   From eval: Patient is a 62 y.o. F who was  seen today for physical therapy evaluation and treatment for R shoulder and midback pain. Assessment significant for very weak postural stabilizers (scapular and rotator cuff muscles) with decreased thoracolumbar paraspinal endurance likely leading to her continued and intermittent midback pain. Pt endorses she has kept up with some of her stretches from prior PT episode. Pt will benefit from PT to address these deficits to improve her level of function with all daily tasks.   OBJECTIVE IMPAIRMENTS: decreased activity tolerance, decreased endurance, decreased mobility, decreased strength, increased fascial restrictions, impaired flexibility, impaired UE functional use, improper body mechanics, postural dysfunction, and pain.    GOALS: Goals reviewed with patient? Yes  SHORT TERM GOALS: Target date: 04/22/2023   Pt will be ind with initial HEP Baseline: Goal status: MET  2.  Pt will report >/=25% improvement in pain Baseline:  04/22/23: 80-90% per pt report Goal status: MET    LONG TERM GOALS: Target date: 05/20/2023   Pt will be ind with management and progression of HEP Baseline:  Goal status: MET  2.  Pt will demo at least 4/5 strength in mid/low trap for improved postural stability Baseline:  Goal status: PARTIALLY MET  3.  Pt will report >/=50% improvement in back and shoulder pain Baseline:  04/22/23:  80-90% per pt report Goal status: MET  4.  Pt will be able to lift and carry at least 5# overhead for kitchen tasks Baseline:  Goal status: MET 05/02/23  5.  Pt will have at least 9 point difference in modified Oswestry to demo MCID Baseline:  05/06/23: 20% Goal status: NOT MET   PLAN:  PT FREQUENCY: 2x/week  PT DURATION: 8 weeks  PLANNED INTERVENTIONS: Therapeutic exercises, Therapeutic activity, Neuromuscular re-education, Balance training, Gait training, Patient/Family education, Self Care, Joint mobilization, Stair training, Aquatic Therapy, Dry Needling, Electrical stimulation, Spinal mobilization, Cryotherapy, Moist heat, Taping, Vasopneumatic device, Ionotophoresis 4mg /ml Dexamethasone, Manual therapy, and Re-evaluation  PLAN FOR NEXT SESSION: Review HEP. Progress postural stabilization exercises. Manual work for thoracolumbar paraspinals as indicated.    Meilah Delrosario April Ma L Zuzu Befort, PT 05/06/2023, 8:01 AM   PHYSICAL THERAPY DISCHARGE SUMMARY  Visits from Start of Care: 8  Current functional level related to goals / functional outcomes: See above for most current PT status.  Pt didn't return to PT.    Remaining deficits: See above    Education / Equipment: HEP   Patient agrees to discharge. Patient goals were partially met. Patient is being discharged due to not returning since the last visit.  Lorrene Reid, PT 10/20/23 3:39 PM

## 2023-05-09 ENCOUNTER — Encounter: Payer: Self-pay | Admitting: Family Medicine

## 2023-05-09 ENCOUNTER — Ambulatory Visit (INDEPENDENT_AMBULATORY_CARE_PROVIDER_SITE_OTHER): Payer: Medicaid Other | Admitting: Family Medicine

## 2023-05-09 VITALS — BP 132/82 | HR 64 | Temp 97.2°F | Ht 62.99 in | Wt 201.6 lb

## 2023-05-09 DIAGNOSIS — I10 Essential (primary) hypertension: Secondary | ICD-10-CM

## 2023-05-09 DIAGNOSIS — Z1159 Encounter for screening for other viral diseases: Secondary | ICD-10-CM | POA: Diagnosis not present

## 2023-05-09 DIAGNOSIS — E119 Type 2 diabetes mellitus without complications: Secondary | ICD-10-CM | POA: Diagnosis not present

## 2023-05-09 DIAGNOSIS — R600 Localized edema: Secondary | ICD-10-CM | POA: Diagnosis not present

## 2023-05-09 MED ORDER — FUROSEMIDE 20 MG PO TABS
20.0000 mg | ORAL_TABLET | Freq: Every day | ORAL | 1 refills | Status: DC
Start: 2023-05-09 — End: 2023-05-31

## 2023-05-09 MED ORDER — LISINOPRIL 10 MG PO TABS
10.0000 mg | ORAL_TABLET | Freq: Every day | ORAL | 0 refills | Status: DC
Start: 2023-05-09 — End: 2023-06-15

## 2023-05-09 MED ORDER — SEMAGLUTIDE(0.25 OR 0.5MG/DOS) 2 MG/3ML ~~LOC~~ SOPN
PEN_INJECTOR | SUBCUTANEOUS | 1 refills | Status: DC
Start: 2023-05-09 — End: 2023-06-16

## 2023-05-09 NOTE — Assessment & Plan Note (Signed)
Chronic.  Uncontrolled w/hyperglycemia. Taking metformin 1000mg  twice daily. Mounjaro not covered.  Will do ozempic 0.25mg  weekly  Follow up 3 mo.  Labs next wk

## 2023-05-09 NOTE — Patient Instructions (Signed)
It was very nice to see you today!  Sent   PLEASE NOTE:  If you had any lab tests please let us know if you have not heard back within a few days. You may see your results on MyChart before we have a chance to review them but we will give you a call once they are reviewed by [place]. If we ordered any referrals today, please let us know if you have not heard from their office within the next week.   Please try these tips to maintain a healthy lifestyle:  Eat most of your calories during the day when you are active. Eliminate processed foods including packaged sweets (pies, cakes, cookies), reduce intake of potatoes, white bread, white pasta, and white rice. Look for whole grain options, oat flour or almond flour.  Each meal should contain half fruits/vegetables, one quarter protein, and one quarter carbs (no bigger than a computer mouse).  Cut down on sweet beverages. This includes juice, soda, and sweet tea. Also watch fruit intake, though this is a healthier sweet option, it still contains natural sugar! Limit to 3 servings daily.  Drink at least 1 glass of water with each meal and aim for at least 8 glasses per day  Exercise at least 150 minutes every week.

## 2023-05-09 NOTE — Progress Notes (Signed)
Subjective:     Patient ID: Heidi Davis, female    DOB: [birth year], 62 y.o.   MRN: [id number]  Chief Complaint  Patient presents with   3 month follow-up   Hypertension   Diabetes   Hyperlipidemia   Medication Refill    Lisinopril.    HPI - here with daughter who is interpreting.   HTN - Pt is on lisinopril 10 mg, daily in the mornings. Bp's running 130s/80-90 at home. At initial check today, her bp was 150/86, which she attributes to fasting and not taking her lisinopril this morning. At recheck her Bp improved to 132/82. No ha/dizziness/cp/palp/cough/sob.  DM, type 2 - Taking metformin 1000 mg. She has not been taking Mounjaro due to insurance not covering it. Sugars are running below 150s, occasionally has readings above 200. She reports a reading of 109 yesterday. Has made diet changes, cut sugar intake. She also reports a lack of appetite recently. Has followed up with her eye doctor.   HLD - Taking atorvastatin 20 mg. Managing healthy diet.  Arthritis - On gabapentin 100 mg. Going to PT and has been doing PT exercises at home. Her daughter states the PT has been very helpful.  She has followed up with Dr. Denyse Amass from sports med and states she plans to schedule an appt for possible injection for back pain.   Edema - Taking hydrochlorothiazide 25 mg, states it has not helped. Her edema worsens when standing for long periods. Occasionally has difficulty putting her shoes on due to swelling.  Pap smear - Overdue. She is unsure if she has had a pap previously. She is not interested in scheduling for a pap smear.   Health Maintenance Due  Topic Date Due   Hepatitis C Screening  Never done   Zoster Vaccines- Shingrix (1 of 2) 09/27/2009   DTaP/Tdap/Td (2 - Td or Tdap) 01/08/2021    Past Medical History:  Diagnosis Date   Arthritis    Asthma    long times ago- 12 yrs ago. no meds   Diabetes mellitus    GERD (gastroesophageal reflux disease)    Hypertension     Past  Surgical History:  Procedure Laterality Date   ABDOMINAL HYSTERECTOMY     total   COLONOSCOPY WITH PROPOFOL N/A 11/06/2015   Procedure: COLONOSCOPY WITH PROPOFOL;  Surgeon: Bernette Redbird, MD;  Location: [facility];  Service: Endoscopy;  Laterality: N/A;     Current Outpatient Medications:    atorvastatin (LIPITOR) 20 MG tablet, Take 1 tablet (20 mg total) by mouth daily., Disp: 90 tablet, Rfl: 1   Cholecalciferol 50 MCG (2000 UT) CAPS, Take 1 capsule by mouth daily., Disp: , Rfl:    Cyanocobalamin (B-12 PO), Take 5,000 mcg by mouth daily. B 12 liquid, Disp: , Rfl:    furosemide (LASIX) 20 MG tablet, Take 1 tablet (20 mg total) by mouth daily., Disp: 30 tablet, Rfl: 1   gabapentin (NEURONTIN) 100 MG capsule, Take 1-3 capsules (100-300 mg total) by mouth at bedtime as needed (nerve pain)., Disp: 90 capsule, Rfl: 3   Lactobacillus (PROBIOTIC ACIDOPHILUS PO), Take by mouth daily., Disp: , Rfl:    metFORMIN (GLUCOPHAGE) 1000 MG tablet, Take 1 tablet (1,000 mg total) by mouth 2 (two) times daily with a meal., Disp: 180 tablet, Rfl: 1   Multiple Vitamins-Minerals (WOMENS MULTIVITAMIN PO), Take 1 tablet by mouth daily., Disp: , Rfl:    Omega-3 Fatty Acids (OMEGA 3 PO), Take 300 mg by mouth daily.,  Disp: , Rfl:    PAPAYA ENZYME PO, Take 1 tablet by mouth daily., Disp: , Rfl:    Semaglutide,0.25 or 0.5MG /DOS, 2 MG/3ML SOPN, Inject 0.25 mg into the skin once a week for 28 days, THEN 0.5 mg once a week for 28 days., Disp: 3 mL, Rfl: 1   VITAMIN A PO, Take 2,400 mg by mouth daily., Disp: , Rfl:    Zinc 50 MG TABS, Take 50 mg by mouth daily., Disp: , Rfl:    lisinopril (ZESTRIL) 10 MG tablet, Take 1 tablet (10 mg total) by mouth daily., Disp: 90 tablet, Rfl: 0  Allergies  Allergen Reactions   Pollen Extract     06/20/2015 -   ROS neg/noncontributory except as noted HPI/below     Objective:     BP 132/82 (BP Location: Left Arm, Patient Position: Sitting, Cuff Size: Large)   Pulse 64   Temp  (!) 97.2 F (36.2 C) (Temporal)   Ht 5' 2.99" (1.6 m)   Wt 201 lb 9.6 oz (91.4 kg)   SpO2 97%   BMI 35.72 kg/m  Wt Readings from Last 3 Encounters:  05/09/23 201 lb 9.6 oz (91.4 kg)  02/10/23 207 lb 4 oz (94 kg)  02/03/23 207 lb 3.2 oz (94 kg)    Physical Exam   Gen: WDWN NAD HEENT: NCAT, conjunctiva not injected, sclera nonicteric NECK:  supple, no thyromegaly, no nodes, no carotid bruits CARDIAC: RRR, S1S2+, no murmur. DP 2+B LUNGS: CTAB. No wheezes ABDOMEN:  BS+, soft, NTND, No HSM, no masses EXT:  +Trace bipedal edema MSK: no gross abnormalities.  NEURO: A&O x3.  CN II-XII intact.  PSYCH: normal mood. Good eye contact     Assessment & Plan:  Type 2 diabetes mellitus without complication, without long-term current use of insulin (HCC) Assessment & Plan: Chronic.  Uncontrolled w/hyperglycemia. Taking metformin 1000mg  twice daily. Mounjaro not covered.  Will do ozempic 0.25mg  weekly  Follow up 3 mo.  Labs next wk  Orders: -     Semaglutide(0.25 or 0.5MG /DOS); Inject 0.25 mg into the skin once a week for 28 days, THEN 0.5 mg once a week for 28 days.  Dispense: 3 mL; Refill: 1 -     Comprehensive metabolic panel; Future -     Hemoglobin A1c; Future  Primary hypertension Assessment & Plan: Chronic.  Well controlled.  Continue lisinopril 10mg  daily  Orders: -     Lisinopril; Take 1 tablet (10 mg total) by mouth daily.  Dispense: 90 tablet; Refill: 0  Local edema Assessment & Plan: Chronic.  Hydrochlorothiazide not work.   Will do trial of lasix 40mg  daily, if not help, stop. Wear compression stockings.  Check labs next wk  Orders: -     Furosemide; Take 1 tablet (20 mg total) by mouth daily.  Dispense: 30 tablet; Refill: 1  Screening for viral disease -     Hepatitis C antibody; Future    Return in about 3 months (around 08/09/2023) for chronic follow-up-3 months so early December.  next wk for labs.  I,Rachel Rivera,acting as a scribe for Angelena Sole, MD.,have  documented all relevant documentation on the behalf of Angelena Sole, MD,as directed by  Angelena Sole, MD while in the presence of Angelena Sole, MD.  I, Angelena Sole, MD, have reviewed all documentation for this visit. The documentation on 05/09/23 for the exam, diagnosis, procedures, and orders are all accurate and complete.   Angelena Sole, MD

## 2023-05-09 NOTE — Assessment & Plan Note (Signed)
Chronic.  Hydrochlorothiazide not work.   Will do trial of lasix 40mg  daily, if not help, stop. Wear compression stockings.  Check labs next wk

## 2023-05-09 NOTE — Assessment & Plan Note (Signed)
Chronic. Well controlled.  -Continue lisinopril 10 mg daily

## 2023-05-16 ENCOUNTER — Other Ambulatory Visit (INDEPENDENT_AMBULATORY_CARE_PROVIDER_SITE_OTHER): Payer: Medicaid Other

## 2023-05-16 DIAGNOSIS — Z1159 Encounter for screening for other viral diseases: Secondary | ICD-10-CM | POA: Diagnosis not present

## 2023-05-16 DIAGNOSIS — E119 Type 2 diabetes mellitus without complications: Secondary | ICD-10-CM | POA: Diagnosis not present

## 2023-05-16 LAB — COMPREHENSIVE METABOLIC PANEL
ALT: 19 U/L (ref 0–35)
AST: 22 U/L (ref 0–37)
Albumin: 4.1 g/dL (ref 3.5–5.2)
Alkaline Phosphatase: 117 U/L (ref 39–117)
BUN: 12 mg/dL (ref 6–23)
CO2: 29 mEq/L (ref 19–32)
Calcium: 10.5 mg/dL (ref 8.4–10.5)
Chloride: 98 mEq/L (ref 96–112)
Creatinine, Ser: 0.67 mg/dL (ref 0.40–1.20)
GFR: 92.88 mL/min (ref >60.00)
Glucose, Bld: 217 mg/dL — ABNORMAL HIGH (ref 70–99)
Potassium: 3 mEq/L — ABNORMAL LOW (ref 3.5–5.1)
Sodium: 138 mEq/L (ref 135–145)
Total Bilirubin: 0.8 mg/dL (ref 0.2–1.2)
Total Protein: 7.7 g/dL (ref 6.0–8.3)

## 2023-05-16 LAB — HEMOGLOBIN A1C: Hgb A1c MFr Bld: 8.3 % — ABNORMAL HIGH (ref 4.6–6.5)

## 2023-05-17 ENCOUNTER — Telehealth: Payer: Self-pay

## 2023-05-17 ENCOUNTER — Ambulatory Visit: Payer: No Typology Code available for payment source | Admitting: Family Medicine

## 2023-05-17 LAB — HEPATITIS C ANTIBODY: Hepatitis C Ab: NONREACTIVE

## 2023-05-17 NOTE — Telephone Encounter (Signed)
Pharmacy Patient Advocate Encounter   Received notification from CoverMyMeds that prior authorization for Ozempic (0.25 or 0.5 MG/DOSE) 2MG /3ML pen-injectors is required/requested.   Insurance verification completed.   The patient is insured through [facility] .   Per test claim: PA required; PA submitted to [facility] via CoverMyMeds Key/confirmation #/EOC [facility] Status is pending

## 2023-05-17 NOTE — Telephone Encounter (Signed)
Noted  

## 2023-05-18 ENCOUNTER — Encounter: Payer: Self-pay | Admitting: Family Medicine

## 2023-05-18 ENCOUNTER — Other Ambulatory Visit: Payer: Self-pay | Admitting: *Deleted

## 2023-05-18 ENCOUNTER — Other Ambulatory Visit (HOSPITAL_COMMUNITY): Payer: Self-pay

## 2023-05-18 DIAGNOSIS — E876 Hypokalemia: Secondary | ICD-10-CM

## 2023-05-18 MED ORDER — POTASSIUM CHLORIDE CRYS ER 20 MEQ PO TBCR
20.0000 meq | EXTENDED_RELEASE_TABLET | Freq: Two times a day (BID) | ORAL | 0 refills | Status: DC
Start: 2023-05-18 — End: 2023-05-18

## 2023-05-18 MED ORDER — POTASSIUM CHLORIDE CRYS ER 20 MEQ PO TBCR
20.0000 meq | EXTENDED_RELEASE_TABLET | Freq: Every day | ORAL | 0 refills | Status: DC
Start: 2023-05-18 — End: 2023-06-15

## 2023-05-18 NOTE — Progress Notes (Signed)
Sugars still high-the ozempic should help and working on diet 2.  Potassium low-needs potassium daily #90/1-repeat potassium in 1 wk(this is from the furosemide)-has the meds helped the swelling?  If not, then stop and won't need potassium long term.  If helped and stays on, then needs potassium long term.

## 2023-05-18 NOTE — Addendum Note (Signed)
Addended by: Jobe Gibbon on: 05/18/2023 02:01 PM   Modules accepted: Orders

## 2023-05-18 NOTE — Telephone Encounter (Signed)
Pharmacy Patient Advocate Encounter  Received notification from [facility] that Prior Authorization for Ozempic (0.25 or 0.5 MG/DOSE) 2MG /3ML pen-injectors has been APPROVED from 05/17/2023 to 05/16/2024   PA #/Case ID/Reference #: [id number]

## 2023-05-18 NOTE — Telephone Encounter (Signed)
Noted  

## 2023-05-31 ENCOUNTER — Other Ambulatory Visit: Payer: Self-pay | Admitting: Family Medicine

## 2023-05-31 DIAGNOSIS — R600 Localized edema: Secondary | ICD-10-CM

## 2023-06-08 ENCOUNTER — Other Ambulatory Visit (INDEPENDENT_AMBULATORY_CARE_PROVIDER_SITE_OTHER): Payer: Medicaid Other

## 2023-06-08 DIAGNOSIS — E876 Hypokalemia: Secondary | ICD-10-CM | POA: Diagnosis not present

## 2023-06-09 LAB — POTASSIUM: Potassium: 3.6 mEq/L (ref 3.5–5.1)

## 2023-06-15 ENCOUNTER — Other Ambulatory Visit: Payer: Self-pay | Admitting: *Deleted

## 2023-06-15 ENCOUNTER — Telehealth: Payer: Self-pay | Admitting: Family Medicine

## 2023-06-15 DIAGNOSIS — E7800 Pure hypercholesterolemia, unspecified: Secondary | ICD-10-CM

## 2023-06-15 DIAGNOSIS — I10 Essential (primary) hypertension: Secondary | ICD-10-CM

## 2023-06-15 DIAGNOSIS — E876 Hypokalemia: Secondary | ICD-10-CM

## 2023-06-15 MED ORDER — LISINOPRIL 10 MG PO TABS
10.0000 mg | ORAL_TABLET | Freq: Every day | ORAL | 0 refills | Status: DC
Start: 2023-06-15 — End: 2023-09-19

## 2023-06-15 MED ORDER — METFORMIN HCL 1000 MG PO TABS: 1000.0000 mg | ORAL_TABLET | Freq: Two times a day (BID) | ORAL | 1 refills | Status: DC

## 2023-06-15 MED ORDER — ATORVASTATIN CALCIUM 20 MG PO TABS
20.0000 mg | ORAL_TABLET | Freq: Every day | ORAL | 1 refills | Status: DC
Start: 2023-06-15 — End: 2023-09-20

## 2023-06-15 MED ORDER — POTASSIUM CHLORIDE CRYS ER 20 MEQ PO TBCR
20.0000 meq | EXTENDED_RELEASE_TABLET | Freq: Every day | ORAL | 0 refills | Status: DC
Start: 2023-06-15 — End: 2023-09-19

## 2023-06-15 NOTE — Telephone Encounter (Signed)
Patient will do the .5mg , would like enough sent until she return in December.

## 2023-06-15 NOTE — Telephone Encounter (Signed)
Left message to return call to the office.

## 2023-06-15 NOTE — Telephone Encounter (Signed)
Spoke to patient's daughter and she stated patient has completed 4 weeks of current dose. She is ready to go to the next dose, she will need enough to last her until the end of December, wanted to know if more than one dose can be sent for each time she is ready to go to the next dose. Please advise.

## 2023-06-15 NOTE — Telephone Encounter (Signed)
Pts daughter called and states pt needs a refill on  Semaglutide,0.25 or 0.5MG /DOS, 2 MG/3ML SOPN  But she is also going out of country and would like to know about what to do. Please call pt back.

## 2023-06-16 ENCOUNTER — Other Ambulatory Visit: Payer: Self-pay | Admitting: Family

## 2023-06-16 ENCOUNTER — Encounter: Payer: Self-pay | Admitting: Family Medicine

## 2023-06-16 ENCOUNTER — Ambulatory Visit (INDEPENDENT_AMBULATORY_CARE_PROVIDER_SITE_OTHER): Payer: Medicaid Other | Admitting: Family Medicine

## 2023-06-16 VITALS — BP 130/84 | HR 57 | Ht 63.0 in | Wt 196.0 lb

## 2023-06-16 DIAGNOSIS — E119 Type 2 diabetes mellitus without complications: Secondary | ICD-10-CM

## 2023-06-16 DIAGNOSIS — M545 Low back pain, unspecified: Secondary | ICD-10-CM | POA: Diagnosis not present

## 2023-06-16 DIAGNOSIS — G8929 Other chronic pain: Secondary | ICD-10-CM

## 2023-06-16 DIAGNOSIS — M47816 Spondylosis without myelopathy or radiculopathy, lumbar region: Secondary | ICD-10-CM

## 2023-06-16 MED ORDER — SEMAGLUTIDE(0.25 OR 0.5MG/DOS) 2 MG/1.5ML ~~LOC~~ SOPN: 0.5000 mg | PEN_INJECTOR | SUBCUTANEOUS | 0 refills | Status: DC

## 2023-06-16 NOTE — Progress Notes (Signed)
I, Stevenson Clinch, CMA acting as a scribe for Clementeen Graham, MD.  Heidi Davis is a 62 y.o. female who presents to [facility] today for f/u LBP w/ MRI review. Pt was last seen by Dr. Denyse Amass on 02/03/23 and a L-spine MRI was ordered. Based on findings, facet injections were ordered and later performed on 03/31/23.  Today, pt reports continued lower back pain. Notes about 3-4 weeks of improvement s/p bilateral L5-S1 facet injections. Sx have gradually started to return and are still not as bad as prior to injection. Notes pain radiating into B LE to the feet. Some weakness in B LE. Also n/t in the feet. Refill Gabapentin today, [place], leaving next week.  Dominant symptoms are primarily back pain and less leg pain.  Dx imaging: 02/16/23 L-spine MRI 11/22/22 L-spine XR 12/29/2021 R hip XR             06/06/19 L-spine XR  Pertinent review of systems: No fevers or chills  Relevant historical information: Diabetes and hypertension   Exam:  BP 130/84   Pulse (!) 57   Ht 5\' 3"  (1.6 m)   Wt 196 lb (88.9 kg)   SpO2 98%   BMI 34.72 kg/m  General: Well Developed, well nourished, and in no acute distress.   MSK: L-spine nontender midline decreased lumbar motion normal gait.    Lab and Radiology Results  EXAM: MRI LUMBAR SPINE WITHOUT CONTRAST   TECHNIQUE: Multiplanar, multisequence MR imaging of the lumbar spine was performed. No intravenous contrast was administered.   COMPARISON:  None Available.   FINDINGS: Segmentation: Standard segmentation is assumed. The inferior-most fully formed intervertebral disc labeled L5-S1.   Alignment:  Physiologic.   Vertebrae:  No fracture, evidence of discitis, or bone lesion.   Conus medullaris and cauda equina: Conus extends to the L1 level. Conus appears normal.   Paraspinal and other soft tissues: Partially imaged bilateral renal cysts.   Disc levels:   T12-L1: No  significant disc protrusion, foraminal stenosis, or canal stenosis.   L1-L2: No significant disc protrusion, foraminal stenosis, or canal stenosis.   L2-L3: Small right foraminal disc protrusion. No significant stenosis.   L3-L4: Bilateral facet arthropathy.  No significant stenosis.   L4-L5: Disc bulging and facet arthropathy with mild bilateral foraminal stenosis. No significant canal stenosis   L5-S1: Bilateral facet arthropathy, greater on the right where it is severe. No significant stenosis.   IMPRESSION: Lower lumbar facet arthropathy (greatest and severe on the right at L5-S1). Mild bilateral foraminal stenosis at L4-L5. No significant canal stenosis.     Electronically Signed   By: Feliberto Harts M.D.   On: 02/27/2023 13:54   I, Clementeen Graham, personally (independently) visualized and performed the interpretation of the images attached in this note.     Assessment and Plan: 61 y.o. female with chronic low back pain with minimal to no radicular pain.  She had good but somewhat temporary relief with facet injections targeting bilateral L5-S1 facet joints.  She is an excellent candidate for medial branch block and ablation targeting the bilateral L5-S1 facets.  We talked about the results of the MRI concepts of this procedure and how to get it scheduled.  Unfortunately she is leaving out of the country on October 5 and will be back in December.  I think it is probably going to be December before these injections get done but I will go ahead and  order them now and she can schedule when it is going to make sense for her.  Check back with me as needed.  Certainly more to do in the future if needed including a trial of an epidural steroid injection or referral to pain management for consideration of other procedures.   PDMP not reviewed this encounter. Orders Placed This Encounter  Procedures   DG Facet Jt Neuro Destruct Sing L/S w/Img Guide    Order Specific Question:    Reason for exam:    Answer:   Need confirmatory medial branch block, and if responsive, radiofrequency ablation of: BL L5-S1    Order Specific Question:   Preferred imaging location?    Answer:   [id number]   No orders of the defined types were placed in this encounter.    Discussed warning signs or symptoms. Please see discharge instructions. Patient expresses understanding.   The above documentation has been reviewed and is accurate and complete Clementeen Graham, M.D. Total encounter time 30 minutes including face-to-face time with the patient and, reviewing past medical record, and charting on the date of service.

## 2023-06-16 NOTE — Patient Instructions (Signed)
Thank you for coming in today.   Please call [facility] ([facility]) at [phone] to schedule your spine injection.    Let me know if you have any trouble getting an appointment.

## 2023-06-20 NOTE — Telephone Encounter (Signed)
Noted  

## 2023-06-22 ENCOUNTER — Other Ambulatory Visit: Payer: Self-pay | Admitting: Family Medicine

## 2023-06-22 DIAGNOSIS — I10 Essential (primary) hypertension: Secondary | ICD-10-CM

## 2023-07-16 ENCOUNTER — Other Ambulatory Visit: Payer: Self-pay | Admitting: Family

## 2023-08-22 ENCOUNTER — Ambulatory Visit: Payer: Medicaid Other | Admitting: Family Medicine

## 2023-09-19 ENCOUNTER — Encounter: Payer: Self-pay | Admitting: Family Medicine

## 2023-09-19 ENCOUNTER — Ambulatory Visit: Payer: Medicaid Other | Admitting: Family Medicine

## 2023-09-19 VITALS — BP 130/70 | HR 60 | Temp 97.5°F | Ht 63.0 in | Wt 188.0 lb

## 2023-09-19 DIAGNOSIS — I10 Essential (primary) hypertension: Secondary | ICD-10-CM | POA: Diagnosis not present

## 2023-09-19 DIAGNOSIS — G8929 Other chronic pain: Secondary | ICD-10-CM

## 2023-09-19 DIAGNOSIS — E1169 Type 2 diabetes mellitus with other specified complication: Secondary | ICD-10-CM | POA: Diagnosis not present

## 2023-09-19 DIAGNOSIS — R202 Paresthesia of skin: Secondary | ICD-10-CM | POA: Diagnosis not present

## 2023-09-19 DIAGNOSIS — E119 Type 2 diabetes mellitus without complications: Secondary | ICD-10-CM | POA: Diagnosis not present

## 2023-09-19 DIAGNOSIS — E785 Hyperlipidemia, unspecified: Secondary | ICD-10-CM

## 2023-09-19 DIAGNOSIS — Z7985 Long-term (current) use of injectable non-insulin antidiabetic drugs: Secondary | ICD-10-CM

## 2023-09-19 DIAGNOSIS — M545 Low back pain, unspecified: Secondary | ICD-10-CM

## 2023-09-19 DIAGNOSIS — Z7984 Long term (current) use of oral hypoglycemic drugs: Secondary | ICD-10-CM

## 2023-09-19 LAB — LIPID PANEL
Cholesterol: 203 mg/dL — ABNORMAL HIGH (ref 0–200)
HDL: 44.5 mg/dL (ref >39.00)
LDL Cholesterol: 124 mg/dL — ABNORMAL HIGH (ref 0–99)
NonHDL: 158.02
Total CHOL/HDL Ratio: 5
Triglycerides: 170 mg/dL — ABNORMAL HIGH (ref 0.0–149.0)
VLDL: 34 mg/dL (ref 0.0–40.0)

## 2023-09-19 LAB — COMPREHENSIVE METABOLIC PANEL
ALT: 14 U/L (ref 0–35)
AST: 17 U/L (ref 0–37)
Albumin: 4.2 g/dL (ref 3.5–5.2)
Alkaline Phosphatase: 102 U/L (ref 39–117)
BUN: 8 mg/dL (ref 6–23)
CO2: 27 meq/L (ref 19–32)
Calcium: 10.7 mg/dL — ABNORMAL HIGH (ref 8.4–10.5)
Chloride: 104 meq/L (ref 96–112)
Creatinine, Ser: 0.66 mg/dL (ref 0.40–1.20)
GFR: 92.99 mL/min (ref >60.00)
Glucose, Bld: 97 mg/dL (ref 70–99)
Potassium: 3.7 meq/L (ref 3.5–5.1)
Sodium: 142 meq/L (ref 135–145)
Total Bilirubin: 0.6 mg/dL (ref 0.2–1.2)
Total Protein: 8.4 g/dL — ABNORMAL HIGH (ref 6.0–8.3)

## 2023-09-19 LAB — CBC WITH DIFFERENTIAL/PLATELET
Basophils Absolute: 0 10*3/uL (ref 0.0–0.1)
Basophils Relative: 0.5 % (ref 0.0–3.0)
Eosinophils Absolute: 0.3 10*3/uL (ref 0.0–0.7)
Eosinophils Relative: 3.2 % (ref 0.0–5.0)
HCT: 41.2 % (ref 36.0–46.0)
Hemoglobin: 13.4 g/dL (ref 12.0–15.0)
Lymphocytes Relative: 43.1 % (ref 12.0–46.0)
Lymphs Abs: 3.9 10*3/uL (ref 0.7–4.0)
MCHC: 32.6 g/dL (ref 30.0–36.0)
MCV: 86.4 fL (ref 78.0–100.0)
Monocytes Absolute: 0.5 10*3/uL (ref 0.1–1.0)
Monocytes Relative: 5.3 % (ref 3.0–12.0)
Neutro Abs: 4.3 10*3/uL (ref 1.4–7.7)
Neutrophils Relative %: 47.9 % (ref 43.0–77.0)
Platelets: 274 10*3/uL (ref 150.0–400.0)
RBC: 4.77 Mil/uL (ref 3.87–5.11)
RDW: 14.8 % (ref 11.5–15.5)
WBC: 9 10*3/uL (ref 4.0–10.5)

## 2023-09-19 LAB — MICROALBUMIN / CREATININE URINE RATIO
Creatinine,U: 178.8 mg/dL
Microalb Creat Ratio: 2 mg/g (ref 0.0–30.0)
Microalb, Ur: 3.6 mg/dL — ABNORMAL HIGH (ref 0.0–1.9)

## 2023-09-19 LAB — VITAMIN B12: Vitamin B-12: 1278 pg/mL — ABNORMAL HIGH (ref 211–911)

## 2023-09-19 LAB — HEMOGLOBIN A1C: Hgb A1c MFr Bld: 7.4 % — ABNORMAL HIGH (ref 4.6–6.5)

## 2023-09-19 MED ORDER — LISINOPRIL 10 MG PO TABS
10.0000 mg | ORAL_TABLET | Freq: Every day | ORAL | 1 refills | Status: DC
Start: 2023-09-19 — End: 2024-05-04

## 2023-09-19 MED ORDER — SEMAGLUTIDE(0.25 OR 0.5MG/DOS) 2 MG/1.5ML ~~LOC~~ SOPN
0.5000 mg | PEN_INJECTOR | SUBCUTANEOUS | 0 refills | Status: DC
Start: 2023-09-19 — End: 2023-10-26

## 2023-09-19 NOTE — Progress Notes (Signed)
Subjective:     Patient ID: Heidi Davis, female    DOB: 10-05-1960, 62 y.o.   MRN: [id number]  Chief Complaint  Patient presents with   Hypertension   Diabetes    HPI - here with daughter who is interpreting. From [place]  HTN - Pt is on lisinopril 10 mg, daily in the mornings. Bp's running 130s/80's at home. No ha/dizziness/cp/palp/cough/sob.  DM, type 2 - Taking metformin 1000 mg. She has been taking the ozempic 0.25mg  but ran out as extended trip to [place] so taking qowk Sugars have not been checked in >43mo, machine broke.  Has made diet changes, cut sugar intake.Marland Kitchen Has followed up with her eye doctor.   HLD - Taking atorvastatin 20 mg. Managing healthy diet.  Arthritis - On gabapentin 100 mg. Going to PT and has been doing PT exercises at home. Her daughter states the PT has been very helpful.  She has followed up with Dr. Denyse Amass from sports med and states she plans to schedule an appt for possible injection for back pain. Back flared from going to [place]  Edema - Taking lasix 20 mg daily didn't work so stopped,  Her edema worsens when standing for long periods.  But more numbness now than swelling from neuropathy.    There are no preventive care reminders to display for this patient.    Past Medical History:  Diagnosis Date   Arthritis    Asthma    long times ago- 12 yrs ago. no meds   Diabetes mellitus    GERD (gastroesophageal reflux disease)    Hypertension     Past Surgical History:  Procedure Laterality Date   ABDOMINAL HYSTERECTOMY     total   COLONOSCOPY WITH PROPOFOL N/A 11/06/2015   Procedure: COLONOSCOPY WITH PROPOFOL;  Surgeon: Bernette Redbird, MD;  Location: [facility];  Service: Endoscopy;  Laterality: N/A;     Current Outpatient Medications:    atorvastatin (LIPITOR) 20 MG tablet, Take 1 tablet (20 mg total) by mouth daily., Disp: 90 tablet, Rfl: 1   Blood Glucose Monitoring Suppl (ONETOUCH VERIO) w/Device KIT, 1 each by Does not apply route once  for 1 dose., Disp: 1 kit, Rfl: 1   Cholecalciferol 50 MCG (2000 UT) CAPS, Take 1 capsule by mouth daily., Disp: , Rfl:    Cyanocobalamin (B-12 PO), Take 5,000 mcg by mouth daily. B 12 liquid, Disp: , Rfl:    gabapentin (NEURONTIN) 100 MG capsule, Take 1-3 capsules (100-300 mg total) by mouth at bedtime as needed (nerve pain)., Disp: 90 capsule, Rfl: 3   glucose blood test strip, 1 each by Other route daily at 12 noon., Disp: 100 each, Rfl: 3   Lactobacillus (PROBIOTIC ACIDOPHILUS PO), Take by mouth daily., Disp: , Rfl:    metFORMIN (GLUCOPHAGE) 1000 MG tablet, Take 1 tablet (1,000 mg total) by mouth 2 (two) times daily with a meal., Disp: 180 tablet, Rfl: 1   Multiple Vitamins-Minerals (WOMENS MULTIVITAMIN PO), Take 1 tablet by mouth daily., Disp: , Rfl:    Omega-3 Fatty Acids (OMEGA 3 PO), Take 300 mg by mouth daily., Disp: , Rfl:    OneTouch UltraSoft 2 Lancets MISC, 1 each by Does not apply route daily at 12 noon., Disp: 100 each, Rfl: 3   lisinopril (ZESTRIL) 10 MG tablet, Take 1 tablet (10 mg total) by mouth daily., Disp: 90 tablet, Rfl: 1   Semaglutide,0.25 or 0.5MG /DOS, 2 MG/1.5ML SOPN, Inject 0.5 mg into the skin once a week., Disp: 6  mL, Rfl: 0  Allergies  Allergen Reactions   Pollen Extract     06/20/2015 -   ROS neg/noncontributory except as noted HPI/below     Objective:     BP 130/70 (BP Location: Left Arm, Patient Position: Sitting, Cuff Size: Large)   Pulse 60   Temp (!) 97.5 F (36.4 C) (Temporal)   Ht 5\' 3"  (1.6 m)   Wt 188 lb (85.3 kg)   SpO2 96%   BMI 33.30 kg/m  Wt Readings from Last 3 Encounters:  09/19/23 188 lb (85.3 kg)  06/16/23 196 lb (88.9 kg)  05/09/23 201 lb 9.6 oz (91.4 kg)    Physical Exam   Gen: WDWN NAD HEENT: NCAT, conjunctiva not injected, sclera nonicteric NECK:  supple, no thyromegaly, no nodes, no carotid bruits CARDIAC: RRR, S1S2+, no murmur. DP 2+B LUNGS: CTAB. No wheezes ABDOMEN:  BS+, soft, NTND, No HSM, no masses EXT:  no  edema MSK: no gross abnormalities.  NEURO: A&O x3.  CN II-XII intact.  PSYCH: normal mood. Good eye contact     Assessment & Plan:  Type 2 diabetes mellitus without complication, without long-term current use of insulin (HCC) Assessment & Plan: Chronic.  Uncontrolled w/hyperglycemia. Taking metformin 1000mg  twice daily. Increase ozempic to 0.5 5mg  weekly and titrate monthly  Follow up 3 mo.  Orders: -     Hemoglobin A1c -     Semaglutide(0.25 or 0.5MG /DOS); Inject 0.5 mg into the skin once a week.  Dispense: 6 mL; Refill: 0 -     Microalbumin / creatinine urine ratio -     Comprehensive metabolic panel -     Vitamin B12 -     OneTouch Verio; 1 each by Does not apply route once for 1 dose.  Dispense: 1 kit; Refill: 1 -     Glucose Blood; 1 each by Other route daily at 12 noon.  Dispense: 100 each; Refill: 3 -     OneTouch UltraSoft 2 Lancets; 1 each by Does not apply route daily at 12 noon.  Dispense: 100 each; Refill: 3  Primary hypertension Assessment & Plan: Chronic.  Well controlled.  Continue lisinopril 10mg  daily  Orders: -     Comprehensive metabolic panel -     CBC with Differential/Platelet -     Lisinopril; Take 1 tablet (10 mg total) by mouth daily.  Dispense: 90 tablet; Refill: 1  Hyperlipidemia associated with type 2 diabetes mellitus (HCC) -     Comprehensive metabolic panel -     Lipid panel  Paresthesia Assessment & Plan: Multifactorial.  Chronic.  Has DM, chronic LBP, taking B12(levels good).  On gabapentin.   Chronic midline low back pain without sciatica Assessment & Plan: Chronic.  Inhibits activity.  Going to physical therapy and seeing sport medicine.  On gabapentin.   tylenol as well.     Long term current use of oral hypoglycemic drug  Long-term current use of injectable noninsulin antidiabetic medication     Return in about 3 months (around 12/18/2023) for chronic follow-up.    Angelena Sole, MD

## 2023-09-19 NOTE — Patient Instructions (Signed)
It was very nice to see you today!  Message [place] in 1 month for the ozempic increase.    PLEASE NOTE:  If you had any lab tests please let us know if you have not heard back within a few days. You may see your results on MyChart before we have a chance to review them but we will give you a call once they are reviewed by [place]. If we ordered any referrals today, please let us know if you have not heard from their office within the next week.   Please try these tips to maintain a healthy lifestyle:  Eat most of your calories during the day when you are active. Eliminate processed foods including packaged sweets (pies, cakes, cookies), reduce intake of potatoes, white bread, white pasta, and white rice. Look for whole grain options, oat flour or almond flour.  Each meal should contain half fruits/vegetables, one quarter protein, and one quarter carbs (no bigger than a computer mouse).  Cut down on sweet beverages. This includes juice, soda, and sweet tea. Also watch fruit intake, though this is a healthier sweet option, it still contains natural sugar! Limit to 3 servings daily.  Drink at least 1 glass of water with each meal and aim for at least 8 glasses per day  Exercise at least 150 minutes every week.

## 2023-09-20 ENCOUNTER — Other Ambulatory Visit: Payer: Self-pay | Admitting: *Deleted

## 2023-09-20 DIAGNOSIS — E1169 Type 2 diabetes mellitus with other specified complication: Secondary | ICD-10-CM

## 2023-09-20 MED ORDER — ATORVASTATIN CALCIUM 40 MG PO TABS: 40.0000 mg | ORAL_TABLET | Freq: Every day | ORAL | 1 refills | Status: DC

## 2023-09-20 MED ORDER — ONETOUCH ULTRASOFT 2 LANCETS MISC: 1.0000 | Freq: Every day | 3 refills | Status: DC

## 2023-09-20 MED ORDER — GLUCOSE BLOOD VI STRP: 1.0000 | ORAL_STRIP | Freq: Every day | 3 refills | Status: AC

## 2023-09-20 MED ORDER — ONETOUCH VERIO W/DEVICE KIT: 1.0000 | PACK | Freq: Once | 1 refills | Status: AC

## 2023-09-20 NOTE — Assessment & Plan Note (Signed)
Chronic. Well controlled.  -Continue lisinopril 10 mg daily

## 2023-09-20 NOTE — Assessment & Plan Note (Signed)
 Chronic.  Inhibits activity.  Going to physical therapy and seeing sport medicine.  On gabapentin.   tylenol as well.

## 2023-09-20 NOTE — Assessment & Plan Note (Signed)
 Chronic.  Uncontrolled w/hyperglycemia. Taking metformin 1000mg  twice daily. Increase ozempic to 0.5 5mg  weekly and titrate monthly  Follow up 3 mo.

## 2023-09-20 NOTE — Assessment & Plan Note (Signed)
Multifactorial.  Chronic.  Has DM, chronic LBP, taking B12(levels good).  On gabapentin.

## 2023-09-20 NOTE — Progress Notes (Signed)
Cholesterol not good.  Increase atorvastatin to 40mg  daily(send new dose #90/1) 2.  Sugars better but still need work.  Increased at visit,  ozempic to 0.5mg .  knows to message next month for the 1mg  3. Calcium is a little high-make sure not taking extra calcium. Can reck in 1 month or wait till appt.

## 2023-09-22 ENCOUNTER — Telehealth: Payer: Self-pay | Admitting: *Deleted

## 2023-09-22 NOTE — Telephone Encounter (Signed)
 Left message for patient to return call to office concerning lab results.  Copied from CRM [id number]. Topic: General - Other >> Sep 20, 2023  4:05 PM Robinson H wrote: Reason for CRM: Patient states she missed a call from clinic and not sure why was returning the call.   Heidi Davis [phone]

## 2023-10-26 ENCOUNTER — Encounter: Payer: Self-pay | Admitting: Family Medicine

## 2023-10-26 ENCOUNTER — Other Ambulatory Visit: Payer: Self-pay | Admitting: Family Medicine

## 2023-10-26 DIAGNOSIS — E119 Type 2 diabetes mellitus without complications: Secondary | ICD-10-CM

## 2023-10-26 MED ORDER — SEMAGLUTIDE(0.25 OR 0.5MG/DOS) 2 MG/1.5ML ~~LOC~~ SOPN: 0.5000 mg | PEN_INJECTOR | SUBCUTANEOUS | 0 refills | Status: DC

## 2023-10-27 ENCOUNTER — Encounter: Payer: Self-pay | Admitting: Family Medicine

## 2023-10-28 ENCOUNTER — Ambulatory Visit: Payer: Self-pay | Admitting: Family Medicine

## 2023-10-28 ENCOUNTER — Ambulatory Visit: Payer: Medicaid Other | Admitting: Family Medicine

## 2023-10-28 ENCOUNTER — Encounter: Payer: Self-pay | Admitting: Family Medicine

## 2023-10-28 VITALS — BP 119/75 | HR 74 | Temp 97.5°F | Resp 18 | Ht 63.0 in | Wt 188.5 lb

## 2023-10-28 DIAGNOSIS — N644 Mastodynia: Secondary | ICD-10-CM

## 2023-10-28 DIAGNOSIS — N6452 Nipple discharge: Secondary | ICD-10-CM | POA: Diagnosis not present

## 2023-10-28 DIAGNOSIS — R590 Localized enlarged lymph nodes: Secondary | ICD-10-CM | POA: Diagnosis not present

## 2023-10-28 NOTE — Telephone Encounter (Signed)
 Noted.

## 2023-10-28 NOTE — Progress Notes (Signed)
 Subjective:     Patient ID: Heidi Davis, female    DOB: [birth year], 63 y.o.   MRN: [id number]  Chief Complaint  Patient presents with   Breast Problem    Left breast pain, itching, discharge started 3 weeks ago and lump under arm, started a while ago, has decreased in size Need referral for imaging    HPI-hree w/dau who helps interpret Last mamm 01/18/23 Discussed the use of AI scribe software for clinical note transcription with the patient, who gave verbal consent to proceed.  History of Present Illness   Heidi Davis is a 63 year old female who presents with left breast pain, itching, and discharge.  She has been experiencing left breast pain, itching, and discharge for the past three days. The discharge is thick, almost clear but tinted, and resembles milk. She performs regular breast self-examinations, with the last one conducted before the onset of these symptoms. A mammogram was done last April.  Prior to the breast symptoms, she noticed a recurring bump under her left arm that has been coming and going over the past few weeks. The bump has decreased in size but remains painful. There is no discharge from the armpit.  No fever or injury to the breast.       There are no preventive care reminders to display for this patient.   Past Medical History:  Diagnosis Date   Arthritis    Asthma    long times ago- 12 yrs ago. no meds   Diabetes mellitus    GERD (gastroesophageal reflux disease)    Hypertension     Past Surgical History:  Procedure Laterality Date   ABDOMINAL HYSTERECTOMY     total   COLONOSCOPY WITH PROPOFOL  N/A 11/06/2015   Procedure: COLONOSCOPY WITH PROPOFOL ;  Surgeon: Lamar Bunk, MD;  Location: [facility];  Service: Endoscopy;  Laterality: N/A;     Current Outpatient Medications:    atorvastatin  (LIPITOR) 40 MG tablet, Take 1 tablet (40 mg total) by mouth daily., Disp: 90 tablet, Rfl: 1   Cholecalciferol 50 MCG (2000 UT) CAPS, Take  1 capsule by mouth daily., Disp: , Rfl:    Cyanocobalamin  (B-12 PO), Take 5,000 mcg by mouth daily. B 12 liquid, Disp: , Rfl:    gabapentin  (NEURONTIN ) 100 MG capsule, Take 1-3 capsules (100-300 mg total) by mouth at bedtime as needed (nerve pain)., Disp: 90 capsule, Rfl: 3   glucose blood test strip, 1 each by Other route daily at 12 noon., Disp: 100 each, Rfl: 3   Lactobacillus (PROBIOTIC ACIDOPHILUS PO), Take by mouth daily., Disp: , Rfl:    metFORMIN  (GLUCOPHAGE ) 1000 MG tablet, Take 1 tablet (1,000 mg total) by mouth 2 (two) times daily with a meal., Disp: 180 tablet, Rfl: 1   Multiple Vitamins-Minerals (WOMENS MULTIVITAMIN PO), Take 1 tablet by mouth daily., Disp: , Rfl:    Omega-3 Fatty Acids (OMEGA 3 PO), Take 300 mg by mouth daily., Disp: , Rfl:    OneTouch UltraSoft 2 Lancets MISC, 1 each by Does not apply route daily at 12 noon., Disp: 100 each, Rfl: 3   Semaglutide ,0.25 or 0.5MG /DOS, 2 MG/1.5ML SOPN, Inject 0.5 mg into the skin once a week., Disp: 6 mL, Rfl: 0   lisinopril  (ZESTRIL ) 10 MG tablet, Take 1 tablet (10 mg total) by mouth daily., Disp: 90 tablet, Rfl: 1  Allergies  Allergen Reactions   Pollen Extract     06/20/2015 -   ROS neg/noncontributory except as noted HPI/below  Objective:     BP 119/75   Pulse 74   Temp (!) 97.5 F (36.4 C) (Temporal)   Resp 18   Ht 5' 3 (1.6 m)   Wt 188 lb 8 oz (85.5 kg)   SpO2 99%   BMI 33.39 kg/m  Wt Readings from Last 3 Encounters:  10/28/23 188 lb 8 oz (85.5 kg)  09/19/23 188 lb (85.3 kg)  06/16/23 196 lb (88.9 kg)    Physical Exam   Gen: WDWN NAD HEENT: NCAT, conjunctiva not injected, sclera nonicteric NECK:  supple, no thyromegaly, no nodes, no carotid bruits EXT:  no edema MSK: no gross abnormalities.  NEURO: A&O x3.  CN II-XII intact.  PSYCH: normal mood. Good eye contact  Breasts:  Right: breast appears normal without suspicious masses, no skin or nipple changes or axillary nodes.   Left: breast  appears normal without suspicious masses, no skin or nipple changes.  Whole breast tender to palp.  +approx 1-1.5cm L axillary node-tender.   Chaperone present  QJ       Assessment & Plan:  Pain of left breast -     MM 3D DIAGNOSTIC MAMMOGRAM BILATERAL BREAST; Future -     US  BREAST COMPLETE UNI LEFT INC AXILLA; Future -     US  BREAST COMPLETE UNI RIGHT INC AXILLA; Future  Nipple discharge in female -     MM 3D DIAGNOSTIC MAMMOGRAM BILATERAL BREAST; Future -     US  BREAST COMPLETE UNI LEFT INC AXILLA; Future -     US  BREAST COMPLETE UNI RIGHT INC AXILLA; Future  Axillary lymphadenopathy -     MM 3D DIAGNOSTIC MAMMOGRAM BILATERAL BREAST; Future -     US  BREAST COMPLETE UNI LEFT INC AXILLA; Future -     US  BREAST COMPLETE UNI RIGHT INC AXILLA; Future  Assessment and Plan    Left Breast Pain with Nipple Discharge Severe itching, pain, and thick, almost clear but tinted discharge from the left nipple have been present for three days. There is no fever or breast injury, and a mammogram was normal last April. The differential diagnosis includes mastitis, duct ectasia, or malignancy. Urgent imaging is necessary to rule out malignancy and other conditions.. Order an urgent mammogram and ultrasound of the left breast.  Left Axillary Lump A painful lump in the left axilla has been intermittently present for weeks and has recently decreased in size. There is no discharge. The differential diagnosis includes lymphadenopathy, hidradenitis suppurativa, or a benign cyst. Monitor the lump for changes in size or symptoms and reassess after imaging results.        Return if symptoms worsen or fail to improve.  Jenkins CHRISTELLA Carrel, MD

## 2023-10-28 NOTE — Patient Instructions (Signed)
 Ordered mamm/u/s.  Daughter will call them to sch.  Further plan after results  Can use tylenol /ibuproven for pain

## 2023-10-28 NOTE — Telephone Encounter (Signed)
 Chief Complaint: breast pain Symptoms: left breast pain, itching, discharge, bump in the left underarm Frequency: not long ago Pertinent Negatives: Patient denies fever, chills, skin that is warm to the touch Disposition: [] ED /[] Urgent Care (no appt availability in office) / [x] Appointment(In office/virtual)/ []  [place] [facility]/ [] Home Care/ [] Refused Recommended Disposition /[] [facility]/ []  Follow-up with PCP Additional Notes: Pt's daughter Heidi Davis calls on behalf of pt. Pt has had left breast pain, itching, and discharge. Daughter unsure how long symptoms have been present, but states not for very long. Daughter also states pt reports a bump in her left underarm that is painful as well. Pt rates her pain 8/10. States the discharge is thick and not malodorous. No fever, no chills. Heidi Davis states she messaged PCP in Mychart today and a CMA responded that PCP has availability this AM to see pt. RN tried to schedule pt, did not see any availability until 2/10. Called the [facility]. Nancy at the [facility] states there are availability appts and a slot at 1000. Heidi Davis said 1000 would work. Inocente booked appt.  RN advised Heidi Davis please call back if pt worsens.   Copied from CRM [id number]. Topic: Clinical - Red Word Triage >> Oct 28, 2023  8:37 AM Joanell NOVAK wrote: Red Word that prompted transfer to Nurse Triage: Heidi Davis pt daughter called and stated that the pt is having breast issues, pain itching, discharge, and discomfort Reason for Disposition  [1] Breast looks infected (spreading redness, feels hot or painful to touch) AND [2] no fever  Answer Assessment - Initial Assessment Questions 1. SYMPTOM: What's the main symptom you're concerned about?  (e.g., lump, pain, rash, nipple discharge)     Daughter Heidi Davis on the phone. Left breast pain, itching, discomfort, discharge. Some relief after discharge expelled. Bump under left underarm a few weeks ago that comes and goes, states bump is  smaller now. 2. LOCATION: Where is the pain located?     Left - 8/10 pain 3. ONSET: When did pain  start?     Not sure honestly - daughter on the phone, states pt told her yesterday, daughter states it may have been a few weeks ago that it started 4. PRIOR HISTORY: Do you have any history of prior problems with your breasts? (e.g., lumps, cancer, fibrocystic breast disease)     I think she had something like this a long time ago - no hx of breast cancer in med history 5. CAUSE: What do you think is causing this symptom?     No idea 6. OTHER SYMPTOMS: Do you have any other symptoms? (e.g., fever, breast pain, redness or rash, nipple discharge)     Pain, itching, discomfort, discharge. Discharge is a thick consistency. No fever. Bump in left underarm area.  Protocols used: Breast Symptoms-A-AH

## 2023-10-31 ENCOUNTER — Ambulatory Visit
Admission: RE | Admit: 2023-10-31 | Discharge: 2023-10-31 | Disposition: A | Discharge status: Home / Self Care | Payer: Medicaid Other | Source: Ambulatory Visit | Attending: Family Medicine | Admitting: Family Medicine

## 2023-10-31 ENCOUNTER — Encounter: Payer: Self-pay | Admitting: Family Medicine

## 2023-10-31 ENCOUNTER — Other Ambulatory Visit: Payer: Medicaid Other

## 2023-10-31 ENCOUNTER — Ambulatory Visit
Admission: RE | Admit: 2023-10-31 | Discharge: 2023-10-31 | Discharge status: Home / Self Care | Payer: Medicaid Other | Source: Ambulatory Visit | Attending: Family Medicine | Admitting: Family Medicine

## 2023-10-31 DIAGNOSIS — N644 Mastodynia: Secondary | ICD-10-CM

## 2023-10-31 DIAGNOSIS — N6452 Nipple discharge: Secondary | ICD-10-CM

## 2023-10-31 DIAGNOSIS — R590 Localized enlarged lymph nodes: Secondary | ICD-10-CM

## 2023-10-31 NOTE — Progress Notes (Signed)
 The armpit has a cyst-nothing to do unless painful-then will need to open it Mamm and u/s normal for breast  if symptoms continue-will refer to surgeon

## 2023-12-01 ENCOUNTER — Other Ambulatory Visit: Payer: Self-pay | Admitting: Family Medicine

## 2023-12-01 DIAGNOSIS — E119 Type 2 diabetes mellitus without complications: Secondary | ICD-10-CM

## 2023-12-02 MED ORDER — SEMAGLUTIDE(0.25 OR 0.5MG/DOS) 2 MG/1.5ML ~~LOC~~ SOPN
0.5000 mg | PEN_INJECTOR | SUBCUTANEOUS | 0 refills | Status: DC
Start: 2023-12-02 — End: 2023-12-28

## 2023-12-10 IMAGING — CR DG KNEE COMPLETE 4+V*R*
4 series · 4 of 4 positions shown · non-contrast
Comparison: None.

CLINICAL DATA: Pain right upper leg pain for 3 days.

EXAM:
RIGHT KNEE - COMPLETE 4+ VIEW

[t knee ap right]
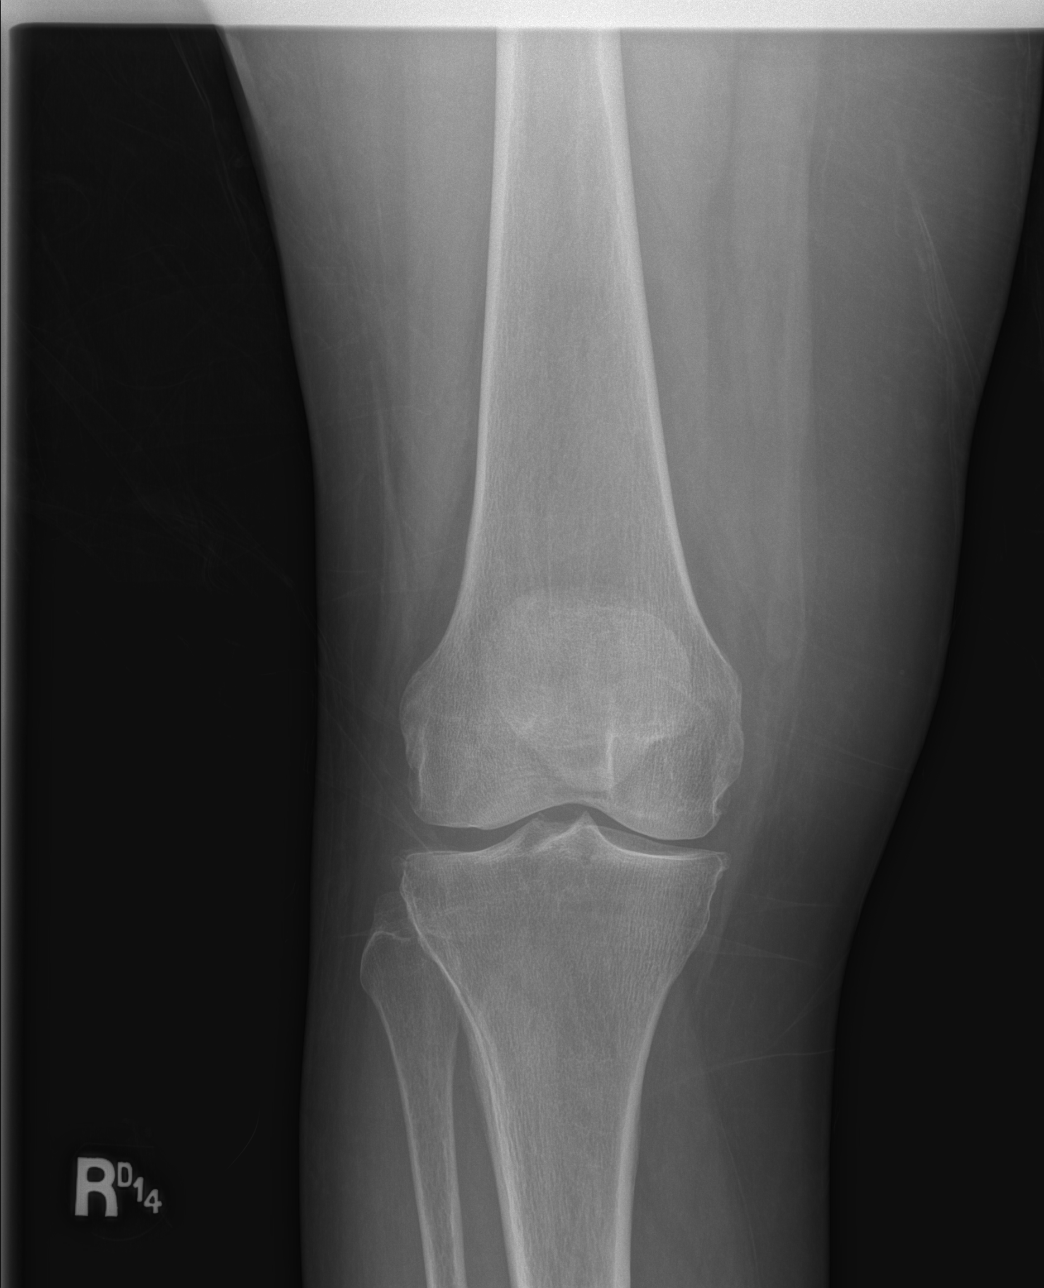

[t knee obl right (1 of 2)]
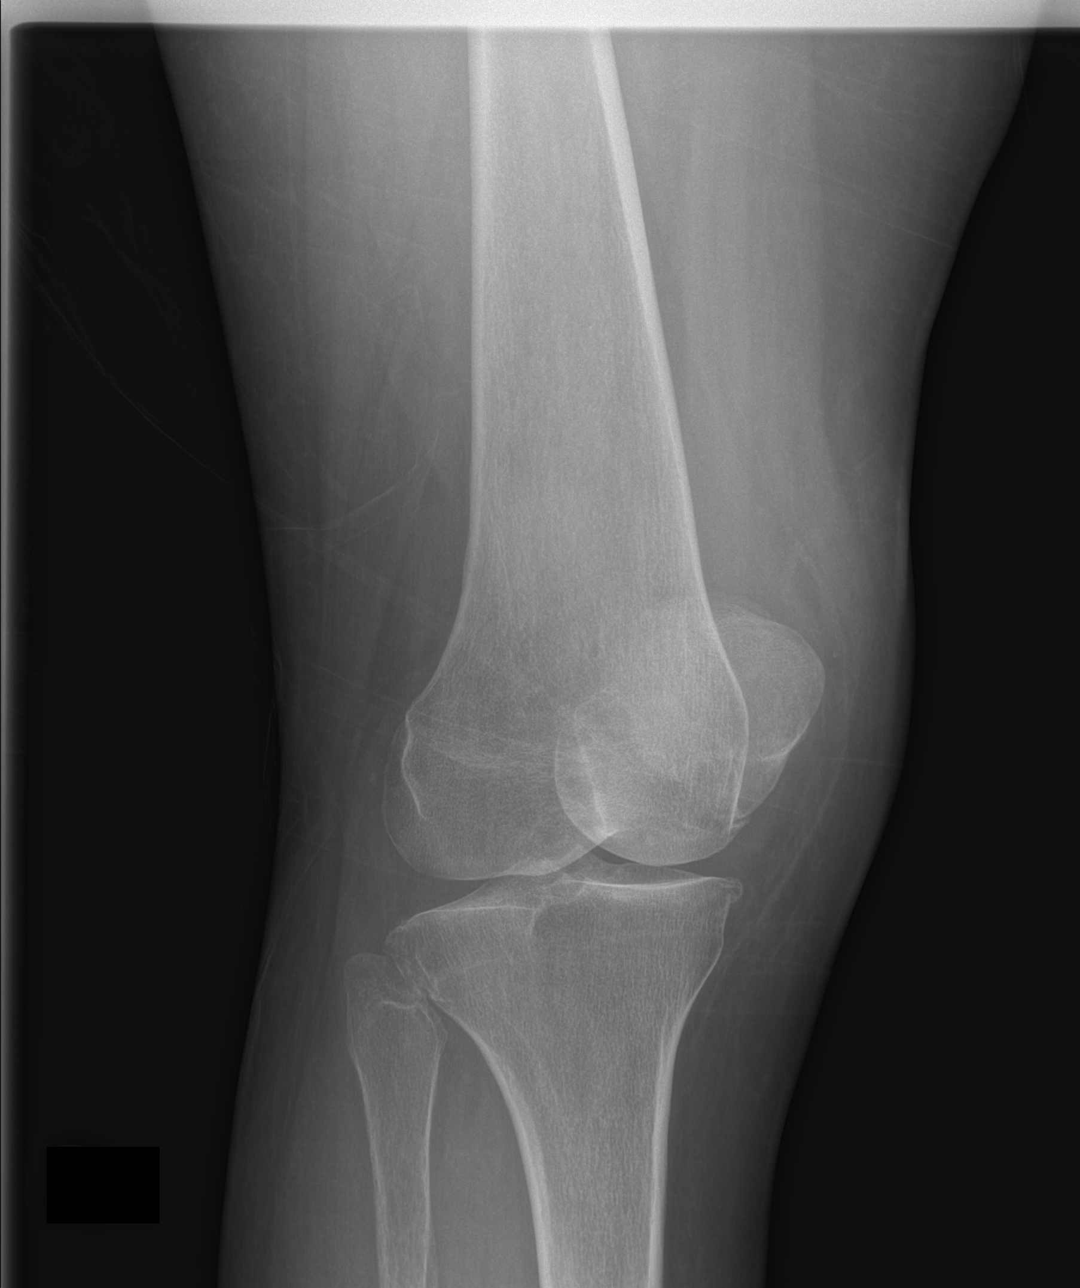

[t knee obl right (2 of 2)]
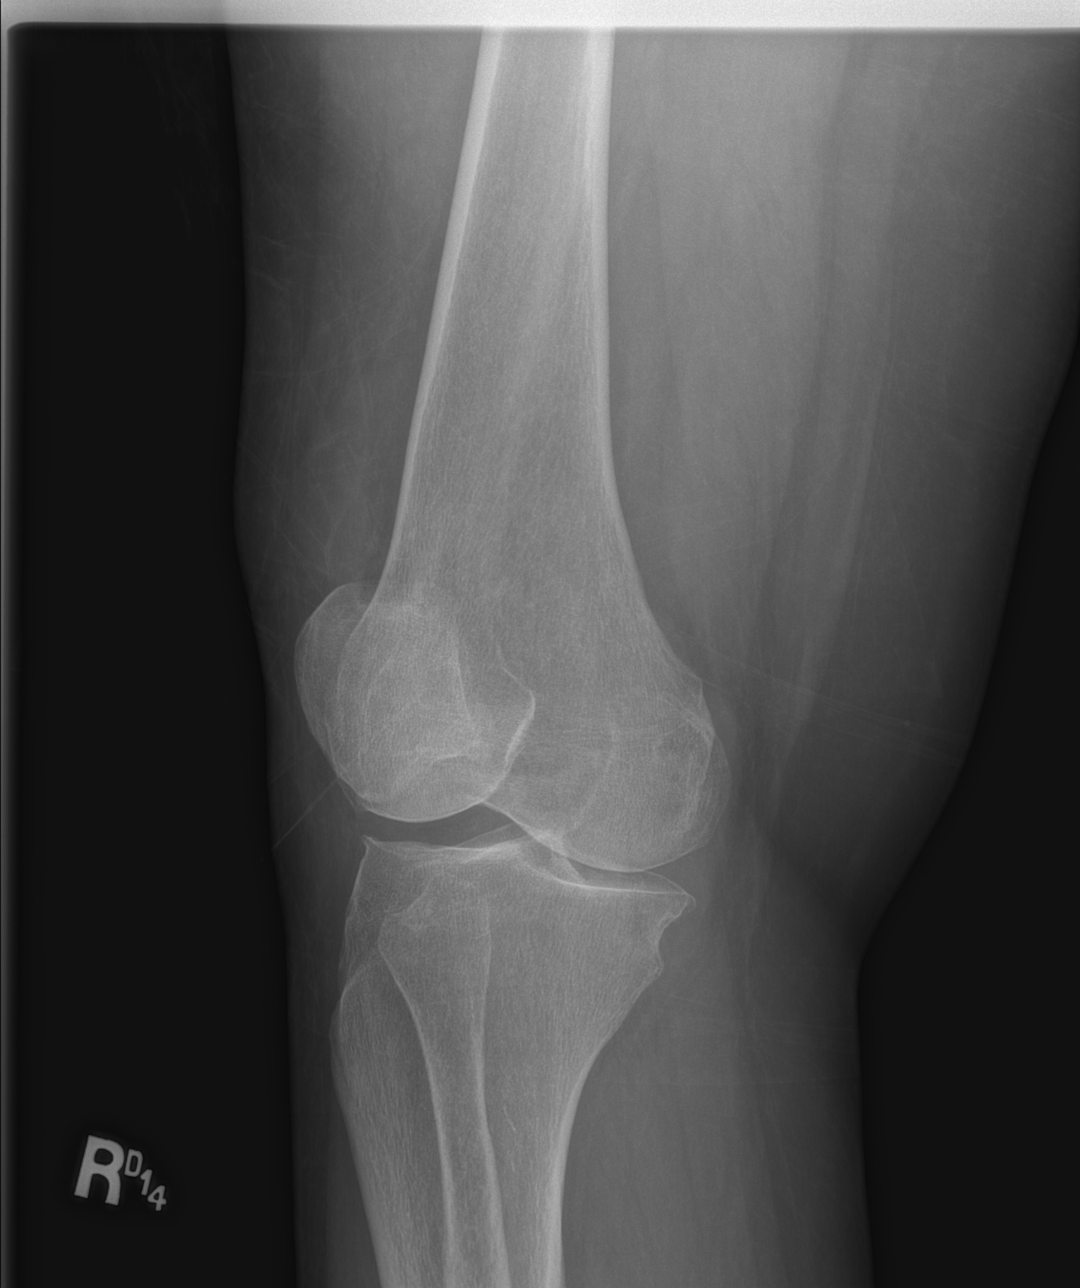

[t knee lat right]
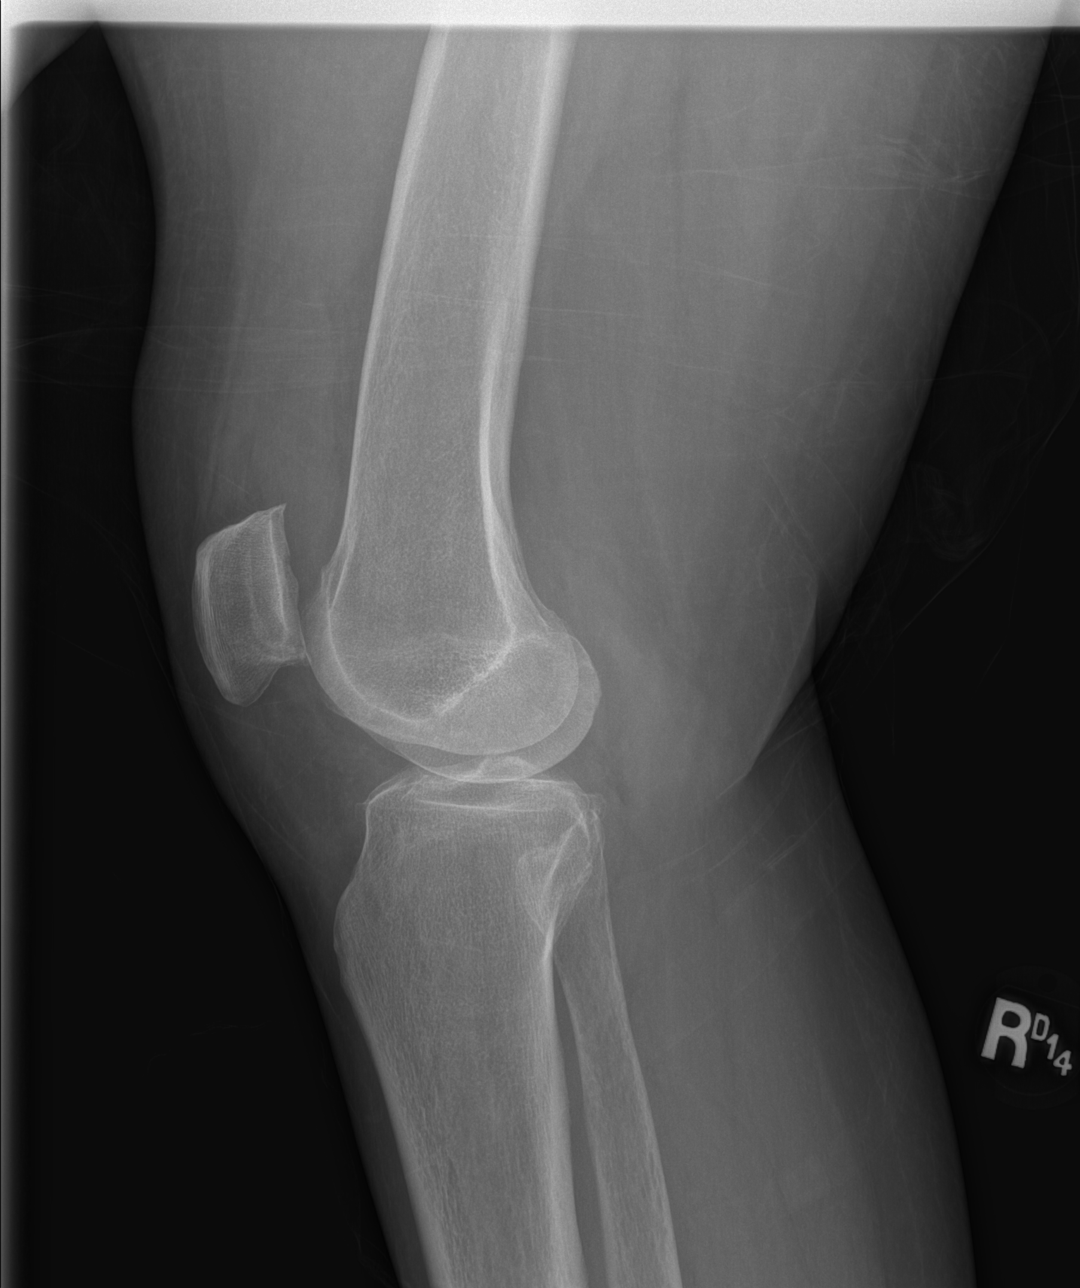

[4 of 4 positions shown; findings below may reference images not displayed]

FINDINGS: No evidence of fracture or dislocation. Mild medial tibiofemoral and
patellofemoral joint space narrowing with marginal osteophytes.
Small suprapatellar joint effusion.
IMPRESSION: 1. No evidence of fracture or dislocation.
2. Mild knee osteoarthritis prominent in the medial tibiofemoral and
patellofemoral compartment with small suprapatellar joint effusion.

## 2023-12-19 ENCOUNTER — Ambulatory Visit (INDEPENDENT_AMBULATORY_CARE_PROVIDER_SITE_OTHER): Admitting: Family Medicine

## 2023-12-19 ENCOUNTER — Encounter: Payer: Self-pay | Admitting: Family Medicine

## 2023-12-19 ENCOUNTER — Ambulatory Visit: Payer: Medicaid Other | Admitting: Family Medicine

## 2023-12-19 ENCOUNTER — Other Ambulatory Visit: Payer: Self-pay | Admitting: Family Medicine

## 2023-12-19 VITALS — BP 119/78 | HR 58 | Temp 97.5°F | Resp 16 | Ht 63.0 in | Wt 187.5 lb

## 2023-12-19 DIAGNOSIS — E785 Hyperlipidemia, unspecified: Secondary | ICD-10-CM

## 2023-12-19 DIAGNOSIS — Z7985 Long-term (current) use of injectable non-insulin antidiabetic drugs: Secondary | ICD-10-CM

## 2023-12-19 DIAGNOSIS — L84 Corns and callosities: Secondary | ICD-10-CM | POA: Diagnosis not present

## 2023-12-19 DIAGNOSIS — I10 Essential (primary) hypertension: Secondary | ICD-10-CM | POA: Diagnosis not present

## 2023-12-19 DIAGNOSIS — E119 Type 2 diabetes mellitus without complications: Secondary | ICD-10-CM

## 2023-12-19 DIAGNOSIS — Z7984 Long term (current) use of oral hypoglycemic drugs: Secondary | ICD-10-CM

## 2023-12-19 DIAGNOSIS — E1169 Type 2 diabetes mellitus with other specified complication: Secondary | ICD-10-CM

## 2023-12-19 LAB — COMPREHENSIVE METABOLIC PANEL WITH GFR
ALT: 8 U/L (ref 0–35)
AST: 13 U/L (ref 0–37)
Albumin: 4.2 g/dL (ref 3.5–5.2)
Alkaline Phosphatase: 104 U/L (ref 39–117)
BUN: 10 mg/dL (ref 6–23)
CO2: 29 meq/L (ref 19–32)
Calcium: 10.1 mg/dL (ref 8.4–10.5)
Chloride: 105 meq/L (ref 96–112)
Creatinine, Ser: 0.67 mg/dL (ref 0.40–1.20)
GFR: 92.5 mL/min (ref >60.00)
Glucose, Bld: 102 mg/dL — ABNORMAL HIGH (ref 70–99)
Potassium: 4.1 meq/L (ref 3.5–5.1)
Sodium: 142 meq/L (ref 135–145)
Total Bilirubin: 0.8 mg/dL (ref 0.2–1.2)
Total Protein: 7.7 g/dL (ref 6.0–8.3)

## 2023-12-19 LAB — LIPID PANEL
Cholesterol: 160 mg/dL (ref 0–200)
HDL: 45.8 mg/dL (ref >39.00)
LDL Cholesterol: 90 mg/dL (ref 0–99)
NonHDL: 114.55
Total CHOL/HDL Ratio: 4
Triglycerides: 125 mg/dL (ref 0.0–149.0)
VLDL: 25 mg/dL (ref 0.0–40.0)

## 2023-12-19 LAB — CBC WITH DIFFERENTIAL/PLATELET
Basophils Absolute: 0 10*3/uL (ref 0.0–0.1)
Basophils Relative: 0.6 % (ref 0.0–3.0)
Eosinophils Absolute: 0.5 10*3/uL (ref 0.0–0.7)
Eosinophils Relative: 5.8 % — ABNORMAL HIGH (ref 0.0–5.0)
HCT: 39.6 % (ref 36.0–46.0)
Hemoglobin: 13 g/dL (ref 12.0–15.0)
Lymphocytes Relative: 51.9 % — ABNORMAL HIGH (ref 12.0–46.0)
Lymphs Abs: 4.5 10*3/uL — ABNORMAL HIGH (ref 0.7–4.0)
MCHC: 32.8 g/dL (ref 30.0–36.0)
MCV: 85.9 fl (ref 78.0–100.0)
Monocytes Absolute: 0.5 10*3/uL (ref 0.1–1.0)
Monocytes Relative: 6.1 % (ref 3.0–12.0)
Neutro Abs: 3.1 10*3/uL (ref 1.4–7.7)
Neutrophils Relative %: 35.6 % — ABNORMAL LOW (ref 43.0–77.0)
Platelets: 250 10*3/uL (ref 150.0–400.0)
RBC: 4.61 Mil/uL (ref 3.87–5.11)
RDW: 15.7 % — ABNORMAL HIGH (ref 11.5–15.5)
WBC: 8.6 10*3/uL (ref 4.0–10.5)

## 2023-12-19 LAB — HEMOGLOBIN A1C: Hgb A1c MFr Bld: 7 % — ABNORMAL HIGH (ref 4.6–6.5)

## 2023-12-19 NOTE — Progress Notes (Signed)
 Subjective:     Patient ID: Heidi Davis, female    DOB: Dec 27, 1960, 62 y.o.   MRN: [id number]  Chief Complaint  Patient presents with   Medical Management of Chronic Issues    Follow-up Fasting Constipation, possibly due to medication    HPI - here with daughter who is interpreting. From [place]  HTN - Pt is on lisinopril 10 mg, daily in the mornings. Bp's running "good" at home. No ha/dizziness/cp/palp/cough/sob.  DM, type 2 - Taking metformin 1000 mg. She has been taking the ozempic 0.5 mg Has made diet changes, cut sugar intake.Marland Kitchen Has followed up with her eye doctor. Not taking the ozempic regularly as constipation  HLD - Taking atorvastatin 20 mg. Managing healthy diet.  Arthritis - went to PT in past, but not doing PT exercises at home like in the past. Her daughter states the PT has been very helpful.  She has followed up with Dr. Denyse Amass from [facility] and has had injection x1-helped for awhile but getting worse.  Called but didn't hear from [facility] [facility]-for another.  Discussed the use of AI scribe software for clinical note transcription with the patient, who gave verbal consent to proceed.  History of Present Illness Heidi Davis is a 63 year old female with hypertension, diabetes, and hyperlipidemia who presents for follow-up. She is accompanied by her daughter.  Diabetes management includes metformin 1000 mg twice daily and Ozempic 0.5 mg weekly. She has been inconsistent with Ozempic due to constipation, but it effectively maintains her blood sugar levels, which were 114 mg/dL this morning. She uses over-the-counter remedies like magnesium for constipation, which she finds effective but not doing daily.  For hyperlipidemia, she takes atorvastatin 20 mg daily without issues.  She has a history of arthritis but is not currently taking gabapentin and has not been performing home physical therapy exercises since formal therapy ended last year.  She has a history of  back pain and received an initial injection that provided relief. However, she has been experiencing recurrent pain since returning from a trip overseas.  Her daughter mentions previously high calcium levels, possibly due to consuming powdered milk, which she has since reduced.     Health Maintenance Due  Topic Date Due   OPHTHALMOLOGY EXAM  Never done      Past Medical History:  Diagnosis Date   Arthritis    Asthma    long times ago- 12 yrs ago. no meds   Diabetes mellitus    GERD (gastroesophageal reflux disease)    Hypertension     Past Surgical History:  Procedure Laterality Date   ABDOMINAL HYSTERECTOMY     total   COLONOSCOPY WITH PROPOFOL N/A 11/06/2015   Procedure: COLONOSCOPY WITH PROPOFOL;  Surgeon: Bernette Redbird, MD;  Location: [facility];  Service: Endoscopy;  Laterality: N/A;     Current Outpatient Medications:    Accu-Chek Softclix Lancets lancets, by Does not apply route., Disp: , Rfl:    atorvastatin (LIPITOR) 40 MG tablet, Take 1 tablet (40 mg total) by mouth daily., Disp: 90 tablet, Rfl: 1   Blood Glucose Monitoring Suppl (ACCU-CHEK GUIDE) w/Device KIT, See administration instructions., Disp: , Rfl:    glucose blood test strip, 1 each by Other route daily at 12 noon., Disp: 100 each, Rfl: 3   metFORMIN (GLUCOPHAGE) 1000 MG tablet, TAKE 1 TABLET (1,000 MG TOTAL) BY MOUTH TWICE A DAY WITH FOOD, Disp: 180 tablet, Rfl: 1   Semaglutide,0.25 or 0.5MG /DOS, 2 MG/1.5ML  SOPN, Inject 0.5 mg into the skin once a week., Disp: 6 mL, Rfl: 0   lisinopril (ZESTRIL) 10 MG tablet, Take 1 tablet (10 mg total) by mouth daily., Disp: 90 tablet, Rfl: 1  Allergies  Allergen Reactions   Bee Pollen     06/20/2015 -   Pollen Extract     06/20/2015 -   ROS neg/noncontributory except as noted HPI/below     Objective:     BP 119/78   Pulse (!) 58   Temp (!) 97.5 F (36.4 C) (Temporal)   Resp 16   Ht 5\' 3"  (1.6 m)   Wt 187 lb 8 oz (85 kg)   SpO2 99%   BMI 33.21  kg/m  Wt Readings from Last 3 Encounters:  12/19/23 187 lb 8 oz (85 kg)  10/28/23 188 lb 8 oz (85.5 kg)  09/19/23 188 lb (85.3 kg)    Physical Exam   Gen: WDWN NAD HEENT: NCAT, conjunctiva not injected, sclera nonicteric NECK:  supple, no thyromegaly, no nodes, no carotid bruits CARDIAC: RRR, S1S2+, no murmur. DP 2+B LUNGS: CTAB. No wheezes ABDOMEN:  BS+, soft, NTND, No HSM, no masses EXT:  no edema MSK: no gross abnormalities.  NEURO: A&O x3.  CN II-XII intact.  PSYCH: normal mood. Good eye contact Diabetic Foot Exam - Simple   Simple Foot Form Diabetic Foot exam was performed with the following findings: Yes 12/19/2023 12:09 PM  Visual Inspection See comments: Yes Sensation Testing See comments: Yes Pulse Check Posterior Tibialis and Dorsalis pulse intact bilaterally: Yes Comments R foot decreased sens to monofilament.  L ok Corn base R 5th toe        Assessment & Plan:  Primary hypertension -     CBC with Differential/Platelet -     Comprehensive metabolic panel with GFR  Hyperlipidemia associated with type 2 diabetes mellitus (HCC) -     Lipid panel -     Comprehensive metabolic panel with GFR  Type 2 diabetes mellitus without complication, without long-term current use of insulin (HCC) -     Hemoglobin A1c -     Ambulatory referral to Podiatry  Corn of foot -     Ambulatory referral to Podiatry  Assessment and Plan Assessment & Plan Type 2 Diabetes Mellitus   Diabetes is managed with metformin 1000 mg twice daily and Ozempic 0.5 mg weekly. She reports constipation as a side effect of Ozempic, but blood sugar levels are well-controlled with a recent reading of 114 mg/dL. The importance of Ozempic in controlling blood sugar and protecting kidney function was discussed. Managing constipation is emphasized over discontinuing Ozempic. Ozempic may reduce the need for metformin in the future. Continue metformin 1000 mg twice daily and Ozempic 0.5 mg weekly. Manage  constipation with over-the-counter stool softeners, increased water intake, and abdominal massage. Ensure Ozempic is taken no more frequently than every five days and no less frequently than every two weeks. Prev, sugars not controlled.  Will get copy of ophth exam  Hypertension   Blood pressure is well-controlled on lisinopril 10 mg daily. She reports no symptoms of headache, dizziness, chest pain, or shortness of breath. Continue lisinopril 10 mg daily.  Hyperlipidemia   Hyperlipidemia is managed with atorvastatin 20 mg daily, and she tolerates the medication well. Continue atorvastatin 20 mg daily.  Back Pain   Back pain has returned after a trip overseas. An initial injection provided relief. She did not follow up on a previous referral to [facility]  for further evaluation. Contact [facility] for follow-up on the previous referral. If no response, contact Dr. Zollie Pee office for further assistance.  Arthritis   No current issues with arthritis. Gabapentin is prescribed as needed but is not currently being taken. The importance of continuing home physical therapy exercises to maintain joint function is emphasized. Encourage continuation of home physical therapy exercises.      Return in about 3 months (around 03/19/2024) for DM, HTN, cholesterol.    Angelena Sole, MD

## 2023-12-19 NOTE — Patient Instructions (Addendum)
 Colace 200mg  daily, magnesium daily, miralax daily.  Drink plenty of water and massage tummy.   Try [facility] for injections.  If no response, call Dr. Denyse Amass

## 2023-12-20 ENCOUNTER — Encounter: Payer: Self-pay | Admitting: Family Medicine

## 2023-12-20 NOTE — Progress Notes (Signed)
 Everything is where it should be.  Continue the ozempic!

## 2023-12-21 ENCOUNTER — Ambulatory Visit (INDEPENDENT_AMBULATORY_CARE_PROVIDER_SITE_OTHER): Admitting: Podiatrist

## 2023-12-21 ENCOUNTER — Encounter: Payer: Self-pay | Admitting: Podiatrist

## 2023-12-21 DIAGNOSIS — D2371 Other benign neoplasm of skin of right lower limb, including hip: Secondary | ICD-10-CM | POA: Diagnosis not present

## 2023-12-21 DIAGNOSIS — B353 Tinea pedis: Secondary | ICD-10-CM

## 2023-12-21 DIAGNOSIS — G5790 Unspecified mononeuropathy of unspecified lower limb: Secondary | ICD-10-CM

## 2023-12-21 MED ORDER — KETOCONAZOLE 2 % EX CREA: TOPICAL_CREAM | CUTANEOUS | 2 refills | Status: AC

## 2023-12-21 NOTE — Progress Notes (Signed)
 Chief Complaint  Patient presents with   Callouses    RM#2 Patient presents with a corn under pinky toe right foot doesn't cause pain but occasional burning in between the toe.Has noticed for 6 months now patient is a diabetic A1C 7.0 .     HPI: Patient is 63 y.o. female who presents today for an area of callused tissue beneath the fifth toe of the right foot. She cares for it at home with a file.  She denies any pain but relates she has numbness on her right foot greater than left foot.  She is diabeitc as well.   Patient Active Problem List   Diagnosis Date Noted   Hyperlipidemia associated with type 2 diabetes mellitus (HCC) 02/10/2023   Local edema 02/10/2023   Irritable bowel syndrome with both constipation and diarrhea 11/14/2022   Chronic midline low back pain without sciatica 11/14/2022   Paresthesia 11/14/2022   Type 2 diabetes mellitus without complication, without long-term current use of insulin (HCC) 11/12/2022   HTN (hypertension) 10/20/2013   Morbid obesity (HCC) 10/20/2013    Current Outpatient Medications on File Prior to Visit  Medication Sig Dispense Refill   Accu-Chek Softclix Lancets lancets by Does not apply route.     atorvastatin (LIPITOR) 40 MG tablet Take 1 tablet (40 mg total) by mouth daily. 90 tablet 1   Blood Glucose Monitoring Suppl (ACCU-CHEK GUIDE) w/Device KIT See administration instructions.     glucose blood test strip 1 each by Other route daily at 12 noon. 100 each 3   metFORMIN (GLUCOPHAGE) 1000 MG tablet TAKE 1 TABLET (1,000 MG TOTAL) BY MOUTH TWICE A DAY WITH FOOD 180 tablet 1   Semaglutide,0.25 or 0.5MG /DOS, 2 MG/1.5ML SOPN Inject 0.5 mg into the skin once a week. 6 mL 0   lisinopril (ZESTRIL) 10 MG tablet Take 1 tablet (10 mg total) by mouth daily. 90 tablet 1   No current facility-administered medications on file prior to visit.    Allergies  Allergen Reactions   Bee Pollen     06/20/2015 -   Pollen Extract     06/20/2015 -     Review of Systems No fevers, chills, nausea, muscle aches, no difficulty breathing, no calf pain, no chest pain or shortness of breath.   Physical Exam  GENERAL APPEARANCE: Alert, conversant. Appropriately groomed. No acute distress.   VASCULAR: Pedal pulses palpable 2/4 DP and 2/4 PT bilateral.  Capillary refill time is immediate to all digits,  Proximal to distal cooling is warm to warm.  Digital perfusion adequate.   NEUROLOGIC: sensation is decreased to the right greater than left foot to 5.07 monofilament at 2/5 sites right, 4/5 sites left.  Light touch is decreased bilateral, vibratory sensation absent bilateral  MUSCULOSKELETAL: acceptable muscle strength, tone and stability bilateral.  No gross boney pedal deformities noted.  No pain, crepitus or limitation noted with foot and ankle range of motion bilateral.   DERMATOLOGIC: skin is warm, supple, and dry.  Color, texture, and turgor of skin within normal limits.  Callus tissue in the sulcus beneath the fifth toe is noted.  No redness, no swelling noted.  Slight scaling which could represent interdigital tinea noted.   Digital nails are asymptomatic.      Assessment     ICD-10-CM   1. Benign neoplasm of skin of right foot  D23.71     2. Tinea pedis of right foot  B35.3     3. Neuropathy of foot, unspecified laterality  G57.90         Plan  Discussed via her interpreter that she has evidence of neuropathy in her right greater than left foot.  Discussed self exams of her feet and doing daily foot inspections.  Callus tissue is minimal due to the patient filing herself.  No sign of deep intractable keratosis-  callus is superficial and well controlled by the patient.  Slight scaling between toes right foot noted -  rx for ketoconazole cream noted.  Recommended return appointments if the callus gets larger and painful.  Otherwise, she will be seen back as needed or in about a year for a yearly diabetic foot exam.

## 2023-12-21 NOTE — Patient Instructions (Signed)
 Peripheral Neuropathy Peripheral neuropathy is a type of nerve damage. It affects nerves that carry signals between the spinal cord and the arms, legs, and the rest of the body (peripheral nerves). It does not affect nerves in the spinal cord or brain. In peripheral neuropathy, one nerve or a group of nerves may be damaged. Peripheral neuropathy is a broad category that includes many specific nerve disorders, like diabetic neuropathy, hereditary neuropathy, and carpal tunnel syndrome. What are the causes? This condition may be caused by: Certain diseases, such as: Diabetes. This is the most common cause of peripheral neuropathy. Autoimmune diseases, such as rheumatoid arthritis and systemic lupus erythematosus. Nerve diseases that are passed from parent to child (inherited). Kidney disease. Thyroid disease. Other causes may include: Nerve injury. Pressure or stress on a nerve that lasts a long time. Lack (deficiency) of B vitamins. This can result from alcoholism, poor diet, or a restricted diet. Infections. Some medicines, such as cancer medicines (chemotherapy). Poisonous (toxic) substances, such as lead and mercury. Too little blood flowing to the legs. In some cases, the cause of this condition is not known. What are the signs or symptoms? Symptoms of this condition depend on which of your nerves is damaged. Symptoms in the legs, hands, and arms can include: Loss of feeling (numbness) in the feet, hands, or both. Tingling in the feet, hands, or both. Burning pain. Very sensitive skin. Weakness. Not being able to move a part of the body (paralysis). Clumsiness or poor coordination. Muscle twitching. Loss of balance. Symptoms in other parts of the body can include: Not being able to control your bladder. Feeling dizzy. Sexual problems. How is this diagnosed? Diagnosing and finding the cause of peripheral neuropathy can be difficult. Your health care provider will take your  medical history and do a physical exam. A neurological exam will also be done. This involves checking things that are affected by your brain, spinal cord, and nerves (nervous system). For example, your health care provider will check your reflexes, how you move, and what you can feel. You may have other tests, such as: Blood tests. Electromyogram (EMG) and nerve conduction tests. These tests check nerve function and how well the nerves are controlling the muscles. Imaging tests, such as a CT scan or MRI, to rule out other causes of your symptoms. Removing a small piece of nerve to be examined in a lab (nerve biopsy). Removing and examining a small amount of the fluid that surrounds the brain and spinal cord (lumbar puncture). How is this treated? Treatment for this condition may involve: Treating the underlying cause of the neuropathy, such as diabetes, kidney disease, or vitamin deficiencies. Stopping medicines that can cause neuropathy, such as chemotherapy. Medicine to help relieve pain. Medicines may include: Prescription or over-the-counter pain medicine. Anti-seizure medicine. Antidepressants. Pain-relieving patches that are applied to painful areas of skin. Surgery to relieve pressure on a nerve or to destroy a nerve that is causing pain. Physical therapy to help improve movement and balance. Devices to help you move around (assistive devices). Follow these instructions at home: Medicines Take over-the-counter and prescription medicines only as told by your health care provider. Do not take any other medicines without first asking your health care provider. Ask your health care provider if the medicine prescribed to you requires you to avoid driving or using machinery. Lifestyle  Do not use any products that contain nicotine or tobacco. These products include cigarettes, chewing tobacco, and vaping devices, such as e-cigarettes. Smoking keeps  blood from reaching damaged nerves. If you  need help quitting, ask your health care provider. Avoid or limit alcohol. Too much alcohol can cause a vitamin B deficiency, and vitamin B is needed for healthy nerves. Eat a healthy diet. This includes: Eating foods that are high in fiber, such as beans, whole grains, and fresh fruits and vegetables. Limiting foods that are high in fat and processed sugars, such as fried or sweet foods. General instructions  If you have diabetes, work closely with your health care provider to keep your blood sugar under control. If you have numbness in your feet: Check every day for signs of injury or infection. Watch for redness, warmth, and swelling. Wear padded socks and comfortable shoes. These help protect your feet. Develop a good support system. Living with peripheral neuropathy can be stressful. Consider talking with a mental health specialist or joining a support group. Use assistive devices and attend physical therapy as told by your health care provider. This may include using a walker or a cane. Keep all follow-up visits. This is important. Where to find more information [employer] of Neurological Disorders: [employer] Contact a health care provider if: You have new signs or symptoms of peripheral neuropathy. You are struggling emotionally from dealing with peripheral neuropathy. Your pain is not well controlled. Get help right away if: You have an injury or infection that is not healing normally. You develop new weakness in an arm or leg. You have fallen or do so frequently. Summary Peripheral neuropathy is when the nerves in the arms or legs are damaged, resulting in numbness, weakness, or pain. There are many causes of peripheral neuropathy, including diabetes, pinched nerves, vitamin deficiencies, autoimmune disease, and hereditary conditions. Diagnosing and finding the cause of peripheral neuropathy can be difficult. Your health care provider will take your medical  history, do a physical exam, and do tests, including blood tests and nerve function tests. Treatment involves treating the underlying cause of the neuropathy and taking medicines to help control pain. Physical therapy and assistive devices may also help. This information is not intended to replace advice given to you by your health care provider. Make sure you discuss any questions you have with your health care provider. Document Revised: 05/12/2021 Document Reviewed: 05/12/2021 Elsevier Patient Education  2024 [employer].

## 2023-12-22 ENCOUNTER — Telehealth: Payer: Self-pay | Admitting: Family Medicine

## 2023-12-22 DIAGNOSIS — G8929 Other chronic pain: Secondary | ICD-10-CM

## 2023-12-22 DIAGNOSIS — M47816 Spondylosis without myelopathy or radiculopathy, lumbar region: Secondary | ICD-10-CM

## 2023-12-22 NOTE — Telephone Encounter (Signed)
 Order placed for back inj to [facility] [address].

## 2023-12-22 NOTE — Telephone Encounter (Signed)
 Patient's daughter called asking if the epidural order could be re-entered for her? They have decided to proceed with the injection but was told by [facility] that the one entered in September has expired.  Patient's daughter would like a call back when it has been sent so that she can contact [facility].

## 2023-12-27 ENCOUNTER — Ambulatory Visit: Admitting: Podiatry

## 2023-12-28 ENCOUNTER — Other Ambulatory Visit: Payer: Self-pay | Admitting: Family Medicine

## 2023-12-28 DIAGNOSIS — E119 Type 2 diabetes mellitus without complications: Secondary | ICD-10-CM

## 2023-12-28 MED ORDER — SEMAGLUTIDE(0.25 OR 0.5MG/DOS) 2 MG/1.5ML ~~LOC~~ SOPN: 0.5000 mg | PEN_INJECTOR | SUBCUTANEOUS | 0 refills | Status: DC

## 2024-01-27 ENCOUNTER — Ambulatory Visit (INDEPENDENT_AMBULATORY_CARE_PROVIDER_SITE_OTHER): Admitting: Family Medicine

## 2024-01-27 ENCOUNTER — Ambulatory Visit: Payer: Self-pay

## 2024-01-27 ENCOUNTER — Encounter: Payer: Self-pay | Admitting: Family Medicine

## 2024-01-27 VITALS — BP 179/88 | HR 88 | Temp 97.5°F | Resp 16 | Ht 63.0 in | Wt 190.2 lb

## 2024-01-27 DIAGNOSIS — K0889 Other specified disorders of teeth and supporting structures: Secondary | ICD-10-CM | POA: Diagnosis not present

## 2024-01-27 MED ORDER — HYDROCODONE-ACETAMINOPHEN 5-325 MG PO TABS: 1.0000 | ORAL_TABLET | Freq: Four times a day (QID) | ORAL | 0 refills | Status: DC | PRN

## 2024-01-27 MED ORDER — AMOXICILLIN 500 MG PO CAPS: 500.0000 mg | ORAL_CAPSULE | Freq: Three times a day (TID) | ORAL | 0 refills | Status: AC

## 2024-01-27 NOTE — Telephone Encounter (Signed)
 Chief Complaint: tooth pain Symptoms: tooth pain Frequency: x 3 days Pertinent Negatives: Patient denies fever Disposition: [] ED /[] Urgent Care (no appt availability in office) / [x] Appointment(In office/virtual)/ []  [facility]/ [] Home Care/ [] Refused Recommended Disposition /[] [facility]/ []  Follow-up with PCP Additional Notes: pt daughter states pt is having some extreme tooth pain. States pain is in the lower right side toward the front. States pain started 3 days ago. States that pt has tried Ibuprofen  and other OTC tooth medication and it gives her some relief.    Copied from CRM [id number]. Topic: Clinical - Red Word Triage >> Jan 27, 2024 10:05 AM [employer] wrote: Red Word that prompted transfer to Nurse Triage: extreme pain. Reason for Disposition  [1] SEVERE pain (e.g., excruciating, unable to eat, unable to do any normal activities) AND [2] not improved 2 hours after pain medicine  Answer Assessment - Initial Assessment Questions 1. LOCATION: "Which tooth is hurting?"  (e.g., right-side/left-side, upper/lower, front/back)     Lower right side towards front 2. ONSET: "When did the toothache start?"  (e.g., hours, days)      3 days 3. SEVERITY: "How bad is the toothache?"  (Scale 1-10; mild, moderate or severe)   - MILD (1-3): doesn't interfere with chewing    - MODERATE (4-7): interferes with chewing, interferes with normal activities, awakens from sleep     - SEVERE (8-10): unable to eat, unable to do any normal activities, excruciating pain        10/10 4. SWELLING: "Is there any visible swelling of your face?"     no 5. OTHER SYMPTOMS: "Do you have any other symptoms?" (e.g., fever)     Ear pain as well and facial pain  Protocols used: Toothache-A-AH

## 2024-01-27 NOTE — Progress Notes (Signed)
 Subjective:     Patient ID: Heidi Davis, female    DOB: 09/30/1960, 63 y.o.   MRN: [id number]  Chief Complaint  Patient presents with   Dental Pain    Dental pain that started Monday, lower left side    HPI-here w/interpreter Discussed the use of AI scribe software for clinical note transcription with the patient, who gave verbal consent to proceed.  History of Present Illness Heidi Davis is a 63 year old female who presents with severe left lower tooth pain radiating to the left side of her face.  Since Monday, she has been experiencing severe pain in the left lower tooth, which radiates to her left eye, ear, and the entire left side of her face. The pain has not subsided as it usually does after two to three days.  She denies any fever or sore throat. There is no history of tooth breakage, and the pain started suddenly. She attempted to contact her dentist, but several offices were closed today.  She has no history of shingles, and there is no current rash on her face. She is not allergic to any antibiotics.    There are no preventive care reminders to display for this patient.  Past Medical History:  Diagnosis Date   Arthritis    Asthma    long times ago- 12 yrs ago. no meds   Diabetes mellitus    GERD (gastroesophageal reflux disease)    Hypertension     Past Surgical History:  Procedure Laterality Date   ABDOMINAL HYSTERECTOMY     total   COLONOSCOPY WITH PROPOFOL  N/A 11/06/2015   Procedure: COLONOSCOPY WITH PROPOFOL ;  Surgeon: Lanita Pitman, MD;  Location: [facility];  Service: Endoscopy;  Laterality: N/A;     Current Outpatient Medications:    Accu-Chek Softclix Lancets lancets, by Does not apply route., Disp: , Rfl:    amoxicillin (AMOXIL) 500 MG capsule, Take 1 capsule (500 mg total) by mouth 3 (three) times daily for 10 days., Disp: 30 capsule, Rfl: 0   atorvastatin  (LIPITOR) 40 MG tablet, Take 1 tablet (40 mg total) by mouth daily., Disp:  90 tablet, Rfl: 1   Blood Glucose Monitoring Suppl (ACCU-CHEK GUIDE) w/Device KIT, See administration instructions., Disp: , Rfl:    glucose blood test strip, 1 each by Other route daily at 12 noon., Disp: 100 each, Rfl: 3   HYDROcodone -acetaminophen  (NORCO/VICODIN) 5-325 MG tablet, Take 1-2 tablets by mouth every 6 (six) hours as needed for moderate pain (pain score 4-6)., Disp: 20 tablet, Rfl: 0   ketoconazole  (NIZORAL ) 2 % cream, Apply to skin of toes when burning is noticed.-  use daily for 10 days., Disp: 60 g, Rfl: 2   metFORMIN  (GLUCOPHAGE ) 1000 MG tablet, TAKE 1 TABLET (1,000 MG TOTAL) BY MOUTH TWICE A DAY WITH FOOD, Disp: 180 tablet, Rfl: 1   Semaglutide ,0.25 or 0.5MG /DOS, 2 MG/1.5ML SOPN, Inject 0.5 mg into the skin once a week., Disp: 6 mL, Rfl: 0   lisinopril  (ZESTRIL ) 10 MG tablet, Take 1 tablet (10 mg total) by mouth daily., Disp: 90 tablet, Rfl: 1  Allergies  Allergen Reactions   Bee Pollen     06/20/2015 -   Pollen Extract     06/20/2015 -   ROS neg/noncontributory except as noted HPI/below      Objective:      BP (!) 179/88   Pulse 88   Temp (!) 97.5 F (36.4 C) (Temporal)   Resp 16   Ht  5\' 3"  (1.6 m)   Wt 190 lb 4 oz (86.3 kg)   SpO2 100%   BMI 33.70 kg/m  Wt Readings from Last 3 Encounters:  01/27/24 190 lb 4 oz (86.3 kg)  12/19/23 187 lb 8 oz (85 kg)  10/28/23 188 lb 8 oz (85.5 kg)    Physical Exam   Gen: WDWN NAD HEENT: NCAT, conjunctiva not injected, sclera nonicteric NECK:  supple, no thyromegaly, no nodes, no carotid bruits TM WNL B, OP moist, no exudates -pain over caries on L lower tooth.  Poor dentition.  TTP whole L face.   No rash EXT:  no edema MSK: no gross abnormalities.  NEURO: A&O x3.  CN II-XII intact.  PSYCH: normal mood. Good eye contact     Assessment & Plan:  Tooth pain  Other orders -     Amoxicillin; Take 1 capsule (500 mg total) by mouth 3 (three) times daily for 10 days.  Dispense: 30 capsule; Refill: 0 -      HYDROcodone -Acetaminophen ; Take 1-2 tablets by mouth every 6 (six) hours as needed for moderate pain (pain score 4-6).  Dispense: 20 tablet; Refill: 0  Assessment and Plan Assessment & Plan Tooth pain   She experiences severe left lower tooth pain radiating to the left side of the face, including the eye and ear, since Monday. There is no fever, sore throat, or tooth breakage. The pain persists unlike previous episodes. A dental infection is possible, but a definitive diagnosis requires dental evaluation. Prescribe stronger analgesics, cautioning about potential side effects such as drowsiness, confusion, and fatigue. Continue ibuprofen  for pain management. Recommend dental evaluation and potential imaging.PDMP checked.  Hydrocodone .  Amox 500mg  tid.   Possibility of shingles   The severe left lower tooth pain radiates to the left side of the face, including the eye and ear, since Monday. There is no fever or sore throat. Differential diagnosis includes dental infection and shingles. The absence of a rash makes shingles less likely at this time. If a rash develops, it would confirm shingles and require a different treatment approach. Prescribe antibiotics to address potential dental infection. Advise monitoring for a rash on the left side of the face. If a rash develops, perform an E-visit through MyChart for potential shingles diagnosis and treatment.    Return if symptoms worsen or fail to improve.  Ellsworth Haas, MD

## 2024-01-27 NOTE — Telephone Encounter (Signed)
 Noted.

## 2024-01-27 NOTE — Patient Instructions (Addendum)
 Can still take ibuprofen   Will send pain meds and antibiotics to pharmacy  If rash, do e visit as may be shingles  Still get in with dentist

## 2024-02-06 ENCOUNTER — Other Ambulatory Visit: Payer: Self-pay | Admitting: *Deleted

## 2024-02-06 ENCOUNTER — Encounter: Payer: Self-pay | Admitting: Family Medicine

## 2024-02-06 DIAGNOSIS — E119 Type 2 diabetes mellitus without complications: Secondary | ICD-10-CM

## 2024-02-06 MED ORDER — SEMAGLUTIDE(0.25 OR 0.5MG/DOS) 2 MG/1.5ML ~~LOC~~ SOPN
0.5000 mg | PEN_INJECTOR | SUBCUTANEOUS | 0 refills | Status: DC
Start: 2024-02-06 — End: 2024-04-18

## 2024-03-08 ENCOUNTER — Other Ambulatory Visit: Payer: Self-pay | Admitting: Family Medicine

## 2024-03-08 ENCOUNTER — Telehealth: Payer: Self-pay | Admitting: Family Medicine

## 2024-03-08 DIAGNOSIS — Z1231 Encounter for screening mammogram for malignant neoplasm of breast: Secondary | ICD-10-CM

## 2024-03-08 DIAGNOSIS — G8929 Other chronic pain: Secondary | ICD-10-CM

## 2024-03-08 DIAGNOSIS — M47816 Spondylosis without myelopathy or radiculopathy, lumbar region: Secondary | ICD-10-CM

## 2024-03-08 NOTE — Telephone Encounter (Signed)
 Pt states Dr. Alease Hunter ordered an injection for her at [facility] last September ([facility]?). She been out of the [place] and wants to schedule this now, but [facility] no longer performs this.  Per pt request, she would like order moved to [facility].

## 2024-03-12 NOTE — Telephone Encounter (Signed)
 Order placed to [facility] for DG Facet Jt Neuro Destruct Sing L/S w/Img Guide

## 2024-03-20 ENCOUNTER — Ambulatory Visit: Admitting: Family Medicine

## 2024-03-20 NOTE — Addendum Note (Signed)
 Addended by: MARDY LEOTIS RAMAN on: 03/20/2024 01:47 PM   Modules accepted: Orders

## 2024-03-26 ENCOUNTER — Ambulatory Visit
Admission: RE | Admit: 2024-03-26 | Discharge: 2024-03-26 | Disposition: A | Discharge status: Home / Self Care | Source: Ambulatory Visit | Attending: Family Medicine | Admitting: Family Medicine

## 2024-03-26 DIAGNOSIS — Z1231 Encounter for screening mammogram for malignant neoplasm of breast: Secondary | ICD-10-CM

## 2024-03-29 ENCOUNTER — Ambulatory Visit: Payer: Self-pay | Admitting: Family Medicine

## 2024-04-18 ENCOUNTER — Other Ambulatory Visit (HOSPITAL_COMMUNITY): Payer: Self-pay

## 2024-04-18 ENCOUNTER — Encounter: Payer: Self-pay | Admitting: Family Medicine

## 2024-04-18 ENCOUNTER — Other Ambulatory Visit: Payer: Self-pay | Admitting: *Deleted

## 2024-04-18 ENCOUNTER — Telehealth: Payer: Self-pay

## 2024-04-18 DIAGNOSIS — E119 Type 2 diabetes mellitus without complications: Secondary | ICD-10-CM

## 2024-04-18 MED ORDER — SEMAGLUTIDE(0.25 OR 0.5MG/DOS) 2 MG/1.5ML ~~LOC~~ SOPN
0.5000 mg | PEN_INJECTOR | SUBCUTANEOUS | 0 refills | Status: DC
Start: 2024-04-18 — End: 2024-05-04

## 2024-04-18 NOTE — Telephone Encounter (Signed)
 Pharmacy Patient Advocate Encounter   Received notification from CoverMyMeds that prior authorization for Ozempic  (0.25 or 0.5 MG/DOSE) 2MG /3ML pen-injectors  is due for renewal.   Insurance verification completed.   The patient is insured through [facility] MEDICAID  Action: Refill too soon. PA is not needed at this time. Medication Next eligible fill date is  Next Fill Payable on or after 04/27/24. Refill Payable on or after 05/09/24**.

## 2024-04-23 ENCOUNTER — Encounter: Payer: Self-pay | Admitting: Family Medicine

## 2024-04-23 ENCOUNTER — Other Ambulatory Visit: Payer: Self-pay | Admitting: Family Medicine

## 2024-04-23 ENCOUNTER — Ambulatory Visit: Payer: Self-pay

## 2024-04-23 MED ORDER — HYDROCODONE-ACETAMINOPHEN 5-325 MG PO TABS: 1.0000 | ORAL_TABLET | Freq: Four times a day (QID) | ORAL | 0 refills | Status: AC | PRN

## 2024-04-23 MED ORDER — AMOXICILLIN 500 MG PO CAPS: 500.0000 mg | ORAL_CAPSULE | Freq: Three times a day (TID) | ORAL | 0 refills | Status: AC

## 2024-04-23 NOTE — Telephone Encounter (Signed)
 FYI Only or Action Required?: Action required by provider: clinical question for provider.  Patient was last seen in primary care on 01/27/2024 by Wendolyn Jenkins Jansky, MD.  Called Nurse Triage reporting Dental Pain.  Symptoms began several days ago.  Interventions attempted: Nothing.  Symptoms are: gradually worsening.  Triage Disposition: No disposition on file.  Patient/caregiver understands and will follow disposition?: UnsureCopied from CRM #[id number]. Topic: Clinical - Red Word Triage >> Apr 23, 2024  1:21 PM [place] E wrote: [facility] that prompted transfer to Nurse Triage: Tooth pain going on over the weekend, causing patient to not sleep well. Daughter, [name], stated the pain is a 9 out of 10 for her mother. Answer Assessment - Initial Assessment Questions [name], daughter, has called dentist and waiting to hear back if they accept her insurance. Pt is in severe pain with toothache. No office appts until tomorrow. Pt wants to be seen today. Fatima asked if pain medication can be called since no appt. RN advised UC now.  [name] stated if she hasn't heard from office in several hours she will take pt to UC.      1. LOCATION: Which tooth is hurting?  (e.g., right-side/left-side, upper/lower, front/back)     Bottom left 2. ONSET: When did the toothache start?  (e.g., hours, days)      weekend 3. SEVERITY: How bad is the toothache?  (Scale 1-10; mild, moderate or severe)     10 4. SWELLING: Is there any visible swelling of your face?     yes 5. OTHER SYMPTOMS: Do you have any other symptoms? (e.g., fever)     denies  Protocols used: Toothache-A-AH

## 2024-04-24 NOTE — Telephone Encounter (Signed)
 Patient notified of message below.

## 2024-05-01 NOTE — Progress Notes (Addendum)
 Ben Aydeen Blume D.[facility] [address] [place] [address] Phone: [phone]   Assessment and Plan:     1. Chronic midline low back pain without sciatica 2. Facet arthritis of lumbar region -Chronic with exacerbation, subsequent visit - Recurrence of nonradiating low back pain consistent with flare of facet arthropathy - Patient had significant improvement in the past with bilateral L5-S1 facet CSI.  Recommend repeat facet CSI.  Order placed - Start meloxicam  15 mg daily x2 weeks.  If still having pain after 2 weeks, complete 3rd-week of NSAID. May use remaining NSAID as needed once daily for pain control.  Do not to use additional over-the-counter NSAIDs (ibuprofen , naproxen, Advil , Aleve, etc.) while taking prescription NSAIDs.  May use Tylenol  [phone] mg 2 to 3 times a day for breakthrough pain. - Restart physical therapy  3. Chronic bilateral thoracic back pain -Chronic with exacerbation, initial visit - Bilateral upper back pain, left > right, most consistent with muscular pain and dysfunction through rhomboid, trapezius, scapular musculature. - Patient has unremarkable thoracic MRI from 2025 - Start meloxicam  15 mg daily x2 weeks.  If still having pain after 2 weeks, complete 3rd-week of NSAID. May use remaining NSAID as needed once daily for pain control.  Do not to use additional over-the-counter NSAIDs (ibuprofen , naproxen, Advil , Aleve, etc.) while taking prescription NSAIDs.  May use Tylenol  [phone] mg 2 to 3 times a day for breakthrough pain. -Start HEP and physical therapy  15 additional minutes spent for educating Therapeutic Home Exercise Program.  This included exercises focusing on stretching, strengthening, with focus on eccentric aspects.   Long term goals include an improvement in range of motion, strength, endurance as well as avoiding reinjury. Patient's frequency would include in 1-2 times a day, 3-5 times a week for a  duration of 6-12 weeks. Proper technique shown and discussed handout in great detail with ATC.  All questions were discussed and answered.     Patient accompanied by her daughter who translated throughout entirety of office visit.  Pertinent previous records reviewed include none  Follow Up: 6 weeks for reevaluation.  Could consider prednisone course   Subjective:   I, Moenique Parris, am serving as a Neurosurgeon for Doctor Morene Mace  Chief Complaint: low back pain   HPI:   06/16/2023 Heidi Davis is a 63 y.o. female who presents to [facility] today for f/u LBP w/ MRI review. Pt was last seen by Dr. Joane on 02/03/23 and a L-spine MRI was ordered. Based on findings, facet injections were ordered and later performed on 03/31/23.   Today, pt reports continued lower back pain. Notes about 3-4 weeks of improvement s/p bilateral L5-S1 facet injections. Sx have gradually started to return and are still not as bad as prior to injection. Notes pain radiating into B LE to the feet. Some weakness in B LE. Also n/t in the feet. Refill Gabapentin  today, [place], leaving next week.  Dominant symptoms are primarily back pain and less leg pain.  Dx imaging: 02/16/23 L-spine MRI 11/22/22 L-spine XR 12/29/2021 R hip XR             06/06/19 L-spine XR   Pertinent review of systems: No fevers or chills   Relevant historical information: Diabetes and hypertension  05/02/2024 Patient states wants to discuss another epidural.   Relevant Historical Information: Hypertension, DM type II  Additional pertinent review of systems  negative.   Current Outpatient Medications:    meloxicam  (MOBIC ) 15 MG tablet, Take 1 tablet daily for 2 weeks.  If still in pain after 2 weeks, take 1 tablet daily for an additional 1 week., Disp: 30 tablet, Rfl: 0   Accu-Chek Softclix Lancets lancets, by Does not apply route., Disp: , Rfl:    amoxicillin  (AMOXIL ) 500 MG  capsule, Take 1 capsule (500 mg total) by mouth 3 (three) times daily for 10 days., Disp: 30 capsule, Rfl: 0   atorvastatin  (LIPITOR) 40 MG tablet, Take 1 tablet (40 mg total) by mouth daily., Disp: 90 tablet, Rfl: 1   Blood Glucose Monitoring Suppl (ACCU-CHEK GUIDE) w/Device KIT, See administration instructions., Disp: , Rfl:    glucose blood test strip, 1 each by Other route daily at 12 noon., Disp: 100 each, Rfl: 3   HYDROcodone -acetaminophen  (NORCO/VICODIN) 5-325 MG tablet, Take 1-2 tablets by mouth every 6 (six) hours as needed for moderate pain (pain score 4-6)., Disp: 20 tablet, Rfl: 0   ketoconazole  (NIZORAL ) 2 % cream, Apply to skin of toes when burning is noticed.-  use daily for 10 days., Disp: 60 g, Rfl: 2   lisinopril  (ZESTRIL ) 10 MG tablet, Take 1 tablet (10 mg total) by mouth daily., Disp: 90 tablet, Rfl: 1   metFORMIN  (GLUCOPHAGE ) 1000 MG tablet, TAKE 1 TABLET (1,000 MG TOTAL) BY MOUTH TWICE A DAY WITH FOOD, Disp: 180 tablet, Rfl: 1   Semaglutide ,0.25 or 0.5MG /DOS, 2 MG/1.5ML SOPN, Inject 0.5 mg into the skin once a week., Disp: 6 mL, Rfl: 0   Objective:     Vitals:   05/02/24 1454  Pulse: (!) 58  SpO2: 99%  Weight: 193 lb (87.5 kg)  Height: 5' 3 (1.6 m)      Body mass index is 34.19 kg/m.    Physical Exam:    Gen: Appears well, nad, nontoxic and pleasant Psych: Alert and oriented, appropriate mood and affect Neuro: sensation intact, strength is 5/5 in upper and lower extremities, muscle tone wnl Skin: no susupicious lesions or rashes  Back - Normal skin, Spine with normal alignment and no deformity.   No tenderness to vertebral process palpation.   Bilateral thoracic paraspinous muscles are   tender and without spasm, worse on left TTP left rhomboids, trapezius, scapula NTTP gluteal musculature  Gait normal  Pain along scapula with left shoulder internal and external rotation.  No pain with flexion, extension, abduction bilaterally.  Electronically signed by:   Odis Mace D.[facility] 3:20 PM 05/02/24

## 2024-05-02 ENCOUNTER — Ambulatory Visit: Admitting: Sports Medicine

## 2024-05-02 VITALS — HR 58 | Ht 63.0 in | Wt 193.0 lb

## 2024-05-02 DIAGNOSIS — G8929 Other chronic pain: Secondary | ICD-10-CM | POA: Diagnosis not present

## 2024-05-02 DIAGNOSIS — M47816 Spondylosis without myelopathy or radiculopathy, lumbar region: Secondary | ICD-10-CM

## 2024-05-02 DIAGNOSIS — M545 Low back pain, unspecified: Secondary | ICD-10-CM | POA: Diagnosis not present

## 2024-05-02 DIAGNOSIS — M546 Pain in thoracic spine: Secondary | ICD-10-CM

## 2024-05-02 MED ORDER — MELOXICAM 15 MG PO TABS: ORAL_TABLET | ORAL | 0 refills | Status: AC

## 2024-05-02 NOTE — Patient Instructions (Signed)
-   Start meloxicam  15 mg daily x2 weeks.  If still having pain after 2 weeks, complete 3rd-week of NSAID. May use remaining NSAID as needed once daily for pain control.  Do not to use additional over-the-counter NSAIDs (ibuprofen , naproxen, Advil , Aleve, etc.) while taking prescription NSAIDs.  May use Tylenol  [phone] mg 2 to 3 times a day for breakthrough pain.   Low back HEP  PT referral   Facet L5-S1  6 week follow up

## 2024-05-04 ENCOUNTER — Encounter: Payer: Self-pay | Admitting: Family Medicine

## 2024-05-04 ENCOUNTER — Ambulatory Visit (INDEPENDENT_AMBULATORY_CARE_PROVIDER_SITE_OTHER): Admitting: Family Medicine

## 2024-05-04 ENCOUNTER — Telehealth: Payer: Self-pay | Admitting: Sports Medicine

## 2024-05-04 ENCOUNTER — Telehealth: Payer: Self-pay

## 2024-05-04 ENCOUNTER — Ambulatory Visit: Payer: Self-pay | Admitting: Family Medicine

## 2024-05-04 VITALS — BP 160/84 | HR 74 | Temp 97.5°F | Resp 18 | Ht 63.0 in | Wt 194.2 lb

## 2024-05-04 DIAGNOSIS — E785 Hyperlipidemia, unspecified: Secondary | ICD-10-CM

## 2024-05-04 DIAGNOSIS — E1169 Type 2 diabetes mellitus with other specified complication: Secondary | ICD-10-CM

## 2024-05-04 DIAGNOSIS — E119 Type 2 diabetes mellitus without complications: Secondary | ICD-10-CM | POA: Diagnosis not present

## 2024-05-04 DIAGNOSIS — Z79899 Other long term (current) drug therapy: Secondary | ICD-10-CM | POA: Diagnosis not present

## 2024-05-04 DIAGNOSIS — S90222A Contusion of left lesser toe(s) with damage to nail, initial encounter: Secondary | ICD-10-CM

## 2024-05-04 DIAGNOSIS — I10 Essential (primary) hypertension: Secondary | ICD-10-CM

## 2024-05-04 DIAGNOSIS — R202 Paresthesia of skin: Secondary | ICD-10-CM

## 2024-05-04 DIAGNOSIS — Z7984 Long term (current) use of oral hypoglycemic drugs: Secondary | ICD-10-CM

## 2024-05-04 DIAGNOSIS — Z7985 Long-term (current) use of injectable non-insulin antidiabetic drugs: Secondary | ICD-10-CM

## 2024-05-04 LAB — CBC WITH DIFFERENTIAL/PLATELET
Basophils Absolute: 0.1 K/uL (ref 0.0–0.1)
Basophils Relative: 0.6 % (ref 0.0–3.0)
Eosinophils Absolute: 0.5 K/uL (ref 0.0–0.7)
Eosinophils Relative: 5.4 % — ABNORMAL HIGH (ref 0.0–5.0)
HCT: 40.7 % (ref 36.0–46.0)
Hemoglobin: 13.4 g/dL (ref 12.0–15.0)
Lymphocytes Relative: 43.2 % (ref 12.0–46.0)
Lymphs Abs: 4 K/uL (ref 0.7–4.0)
MCHC: 32.9 g/dL (ref 30.0–36.0)
MCV: 86.7 fl (ref 78.0–100.0)
Monocytes Absolute: 0.6 K/uL (ref 0.1–1.0)
Monocytes Relative: 6.2 % (ref 3.0–12.0)
Neutro Abs: 4.1 K/uL (ref 1.4–7.7)
Neutrophils Relative %: 44.6 % (ref 43.0–77.0)
Platelets: 240 K/uL (ref 150.0–400.0)
RBC: 4.69 Mil/uL (ref 3.87–5.11)
RDW: 14.8 % (ref 11.5–15.5)
WBC: 9.3 K/uL (ref 4.0–10.5)

## 2024-05-04 LAB — COMPREHENSIVE METABOLIC PANEL WITH GFR
ALT: 9 U/L (ref 0–35)
AST: 15 U/L (ref 0–37)
Albumin: 4.2 g/dL (ref 3.5–5.2)
Alkaline Phosphatase: 106 U/L (ref 39–117)
BUN: 13 mg/dL (ref 6–23)
CO2: 25 meq/L (ref 19–32)
Calcium: 10 mg/dL (ref 8.4–10.5)
Chloride: 106 meq/L (ref 96–112)
Creatinine, Ser: 0.65 mg/dL (ref 0.40–1.20)
GFR: 92.93 mL/min (ref >60.00)
Glucose, Bld: 104 mg/dL — ABNORMAL HIGH (ref 70–99)
Potassium: 3.8 meq/L (ref 3.5–5.1)
Sodium: 143 meq/L (ref 135–145)
Total Bilirubin: 0.4 mg/dL (ref 0.2–1.2)
Total Protein: 7.5 g/dL (ref 6.0–8.3)

## 2024-05-04 LAB — MICROALBUMIN / CREATININE URINE RATIO
Creatinine,U: 155.1 mg/dL
Microalb Creat Ratio: 41.1 mg/g — ABNORMAL HIGH (ref 0.0–30.0)
Microalb, Ur: 6.4 mg/dL — ABNORMAL HIGH (ref 0.0–1.9)

## 2024-05-04 LAB — VITAMIN B12: Vitamin B-12: 936 pg/mL — ABNORMAL HIGH (ref 211–911)

## 2024-05-04 LAB — LIPID PANEL
Cholesterol: 187 mg/dL (ref 0–200)
HDL: 46.6 mg/dL (ref >39.00)
LDL Cholesterol: 101 mg/dL — ABNORMAL HIGH (ref 0–99)
NonHDL: 140.46
Total CHOL/HDL Ratio: 4
Triglycerides: 199 mg/dL — ABNORMAL HIGH (ref 0.0–149.0)
VLDL: 39.8 mg/dL (ref 0.0–40.0)

## 2024-05-04 LAB — HEMOGLOBIN A1C: Hgb A1c MFr Bld: 6.9 % — ABNORMAL HIGH (ref 4.6–6.5)

## 2024-05-04 MED ORDER — LISINOPRIL 10 MG PO TABS: 10.0000 mg | ORAL_TABLET | Freq: Every day | ORAL | 1 refills | Status: AC

## 2024-05-04 MED ORDER — ATORVASTATIN CALCIUM 40 MG PO TABS
40.0000 mg | ORAL_TABLET | Freq: Every day | ORAL | 1 refills | Status: AC
Start: 2024-05-04 — End: ?

## 2024-05-04 MED ORDER — SEMAGLUTIDE(0.25 OR 0.5MG/DOS) 2 MG/1.5ML ~~LOC~~ SOPN: 0.5000 mg | PEN_INJECTOR | SUBCUTANEOUS | 0 refills | Status: DC

## 2024-05-04 NOTE — Telephone Encounter (Signed)
 I received a fax from [facility] stating after review of the patient's referral to their office, they are not able to process the referral at this time due to: We do not have an Print production planner to schedule patient, we would need someone to reach out to us .  I think that the translators we use are only contracted through [facility].  How would you like to proceed?

## 2024-05-04 NOTE — Progress Notes (Signed)
 B12 is good 2.  A1C at goal-continue same meds 3.  Some protein in urine-already taking lisinopril  and ozempic  so helps protect.  Will continue to monitor but needs to keep bp and sugars in control 4.  Cholesterol is too high-is she taking the atorvastatin  regularly or missed doses?  If missed, take.  If taking, needs to increase to 80mg 

## 2024-05-04 NOTE — Patient Instructions (Signed)
 It was very nice to see you today!  Monitor toes for clearing   PLEASE NOTE:  If you had any lab tests please let us  know if you have not heard back within a few days. You may see your results on MyChart before we have a chance to review them but we will give you a call once they are reviewed by us . If we ordered any referrals today, please let us  know if you have not heard from their office within the next week.   Please try these tips to maintain a healthy lifestyle:  Eat most of your calories during the day when you are active. Eliminate processed foods including packaged sweets (pies, cakes, cookies), reduce intake of potatoes, white bread, white pasta, and white rice. Look for whole grain options, oat flour or almond flour.  Each meal should contain half fruits/vegetables, one quarter protein, and one quarter carbs (no bigger than a computer mouse).  Cut down on sweet beverages. This includes juice, soda, and sweet tea. Also watch fruit intake, though this is a healthier sweet option, it still contains natural sugar! Limit to 3 servings daily.  Drink at least 1 glass of water with each meal and aim for at least 8 glasses per day  Exercise at least 150 minutes every week.

## 2024-05-04 NOTE — Progress Notes (Signed)
 Subjective:     Patient ID: Heidi Davis, female    DOB: 01-27-1961, 63 y.o.   MRN: [id number]  Chief Complaint  Patient presents with   Medical Management of Chronic Issues    3 month follow-up for dm, htn, cholesterol Fasting Big toe nail on right foot discolored     HPI Discussed the use of AI scribe software for clinical note transcription with the patient, who gave verbal consent to proceed.  History of Present Illness Heidi Davis is a 63 year old female with diabetes who presents for f/u DM,htn,hld and also with back pain and left big toe discoloration. She is accompanied by her daughter and an interpreter.  She has been experiencing back pain, which was evaluated by her sports medicine doctor. An MRI showed no issues in the upper back. She started meloxicam  two days ago for pain management.  She also has discoloration and pain in her left big toe, extending to the toenails. The pain is described as a burning sensation with a prickly feeling.  she is unsure of any specific injury causing it and doesn't usu wear closed toe shoes.  For diabetes management, she takes metformin  1000 mg once daily, although it was prescribed as twice daily. She also uses Ozempic  0.5 mg once a week, which has significantly lowered her blood sugar levels. Her fasting blood sugar is around 100 mg/dL, and she does not experience hypoglycemic symptoms such as shakiness or sweating.  She is on lisinopril  10 mg for blood pressure management and atorvastatin  40 mg for cholesterol. Her blood pressure is typically well-controlled at home, around 124/80 mmHg, but was elevated during today's visit.  No headaches, dizziness, chest pain, shortness of breath, or cough.    Health Maintenance Due  Topic Date Due   Diabetic kidney evaluation - Urine ACR  11/13/2023   INFLUENZA VACCINE  04/20/2024    Past Medical History:  Diagnosis Date   Arthritis    Asthma    long times ago- 12 yrs ago. no  meds   Diabetes mellitus    GERD (gastroesophageal reflux disease)    Hypertension     Past Surgical History:  Procedure Laterality Date   ABDOMINAL HYSTERECTOMY     total   COLONOSCOPY WITH PROPOFOL  N/A 11/06/2015   Procedure: COLONOSCOPY WITH PROPOFOL ;  Surgeon: Lamar Bunk, MD;  Location: [facility];  Service: Endoscopy;  Laterality: N/A;     Current Outpatient Medications:    Accu-Chek Softclix Lancets lancets, by Does not apply route., Disp: , Rfl:    Blood Glucose Monitoring Suppl (ACCU-CHEK GUIDE) w/Device KIT, See administration instructions., Disp: , Rfl:    glucose blood test strip, 1 each by Other route daily at 12 noon., Disp: 100 each, Rfl: 3   ketoconazole  (NIZORAL ) 2 % cream, Apply to skin of toes when burning is noticed.-  use daily for 10 days., Disp: 60 g, Rfl: 2   meloxicam  (MOBIC ) 15 MG tablet, Take 1 tablet daily for 2 weeks.  If still in pain after 2 weeks, take 1 tablet daily for an additional 1 week., Disp: 30 tablet, Rfl: 0   metFORMIN  (GLUCOPHAGE ) 1000 MG tablet, TAKE 1 TABLET (1,000 MG TOTAL) BY MOUTH TWICE A DAY WITH FOOD, Disp: 180 tablet, Rfl: 1   atorvastatin  (LIPITOR) 40 MG tablet, Take 1 tablet (40 mg total) by mouth daily., Disp: 90 tablet, Rfl: 1   HYDROcodone -acetaminophen  (NORCO/VICODIN) 5-325 MG tablet, Take 1-2 tablets by mouth every 6 (six) hours  as needed for moderate pain (pain score 4-6). (Patient not taking: Reported on 05/04/2024), Disp: 20 tablet, Rfl: 0   lisinopril  (ZESTRIL ) 10 MG tablet, Take 1 tablet (10 mg total) by mouth daily., Disp: 90 tablet, Rfl: 1   Semaglutide ,0.25 or 0.5MG /DOS, 2 MG/1.5ML SOPN, Inject 0.5 mg into the skin once a week., Disp: 6 mL, Rfl: 0  Allergies  Allergen Reactions   Bee Pollen     06/20/2015 -   Pollen Extract     06/20/2015 -   ROS neg/noncontributory except as noted HPI/below      Objective:     BP (!) 160/84 (BP Location: Left Arm, Patient Position: Sitting, Cuff Size: Normal)   Pulse 74    Temp (!) 97.5 F (36.4 C) (Temporal)   Resp 18   Ht 5' 3 (1.6 m)   Wt 194 lb 4 oz (88.1 kg)   SpO2 100%   BMI 34.41 kg/m  Wt Readings from Last 3 Encounters:  05/04/24 194 lb 4 oz (88.1 kg)  05/02/24 193 lb (87.5 kg)  01/27/24 190 lb 4 oz (86.3 kg)    Physical Exam   Gen: [facility]N NAD HEENT: NCAT, conjunctiva not injected, sclera nonicteric NECK:  supple, no thyromegaly, no nodes, no carotid bruits CARDIAC: RRR, S1S2+, no murmur. DP 2+B LUNGS: CTAB. No wheezes ABDOMEN:  BS+, soft, NTND, No HSM, no masses EXT:  no edema MSK: no gross abnormalities.  NEURO: A&O x3.  CN II-XII intact.  PSYCH: normal mood. Good eye contact  Foot L-looks purplish-like a bruise.        Assessment & Plan:  Type 2 diabetes mellitus without complication, without long-term current use of insulin (HCC) -     Microalbumin / creatinine urine ratio -     Comprehensive metabolic panel with GFR -     Hemoglobin A1c -     Vitamin B12 -     Semaglutide (0.25 or 0.5MG /DOS); Inject 0.5 mg into the skin once a week.  Dispense: 6 mL; Refill: 0  Primary hypertension -     CBC with Differential/Platelet -     Comprehensive metabolic panel with GFR -     Lisinopril ; Take 1 tablet (10 mg total) by mouth daily.  Dispense: 90 tablet; Refill: 1  Hyperlipidemia associated with type 2 diabetes mellitus (HCC) -     Comprehensive metabolic panel with GFR -     Lipid panel -     Atorvastatin  Calcium ; Take 1 tablet (40 mg total) by mouth daily.  Dispense: 90 tablet; Refill: 1  High risk medication use -     Comprehensive metabolic panel with GFR -     Vitamin B12  Contusion of toenail of left foot, initial encounter  Paresthesia  Assessment and Plan Assessment & Plan Traumatic injury to left toes with subungual hematoma   but no trauma per pt.  Not sure of etiol Discoloration and pain in the left big toe, with a burning sensation, likely result from trauma or pressure from shoes. Healing may take up to a  year, with visible improvement expected in about three months. Consider referral to a podiatrist if no improvement occurs in a few months-declines for now..  Type 2 diabetes mellitus with peripheral neuropathy   Currently managed with metformin  1000 mg once daily and Ozempic  0.5 mg once weekly. Blood sugar levels are well-controlled, with fasting glucose around 100 mg/dL. Peripheral neuropathy symptoms include burning and prickling pain, likely due to long-term diabetes or back issues. No  hypoglycemic episodes reported. Check blood sugar before supper once a week. Evaluate A1c to determine if adjustments to Ozempic  or metformin  are needed.  Chronic low back pain   Managed with meloxicam , started two days ago. Previous MRI showed no issues in the upper back. Consider another injection in a different area of the back. Continue meloxicam  for pain management. Monitor blood pressure as meloxicam  can elevate it.  Hypertension   Blood pressure typically well-controlled with lisinopril  10 mg, but elevated today at 160/84 mmHg, possibly due to anxiety or meloxicam  use. Regular home monitoring shows readings around 124/80 mmHg. Recheck blood pressure at home daily for the next few days to assess the impact of meloxicam . Continue lisinopril  10 mg daily.  Hyperlipidemia   Managed with atorvastatin  40 mg. Continue atorvastatin  40 mg daily.    Return in about 3 months (around 08/04/2024) for chronic follow-up.  Jenkins CHRISTELLA Carrel, MD

## 2024-05-04 NOTE — Telephone Encounter (Signed)
 Called [facility] to make sure they had the referral for this patient. They did not find the referral. Faxed the referral to office and let them know that they will need an interpreter to schedule patient. Was assured that patient will be called immediately once referral is received.

## 2024-05-07 ENCOUNTER — Other Ambulatory Visit: Payer: Self-pay

## 2024-05-07 DIAGNOSIS — M47816 Spondylosis without myelopathy or radiculopathy, lumbar region: Secondary | ICD-10-CM

## 2024-05-08 ENCOUNTER — Encounter: Payer: Self-pay | Admitting: Sports Medicine

## 2024-05-14 ENCOUNTER — Telehealth: Payer: Self-pay | Admitting: Family Medicine

## 2024-05-14 ENCOUNTER — Ambulatory Visit: Admitting: Physical Therapy

## 2024-05-14 NOTE — Telephone Encounter (Signed)
 Copied from CRM #[id number]. Topic: Appointments - Scheduling Inquiry for Clinic >> May 11, 2024  4:57 PM Jasmin G wrote: Reason for CRM: Pt would like to reschedule appt with PT, please call her back at [phone] to reschedule.

## 2024-05-15 ENCOUNTER — Other Ambulatory Visit: Payer: Self-pay

## 2024-05-15 ENCOUNTER — Ambulatory Visit: Attending: Sports Medicine | Admitting: Physical Therapy

## 2024-05-15 ENCOUNTER — Encounter: Payer: Self-pay | Admitting: Physical Therapy

## 2024-05-15 DIAGNOSIS — M546 Pain in thoracic spine: Secondary | ICD-10-CM | POA: Insufficient documentation

## 2024-05-15 DIAGNOSIS — G8929 Other chronic pain: Secondary | ICD-10-CM | POA: Diagnosis not present

## 2024-05-15 DIAGNOSIS — M6281 Muscle weakness (generalized): Secondary | ICD-10-CM | POA: Insufficient documentation

## 2024-05-15 DIAGNOSIS — M5459 Other low back pain: Secondary | ICD-10-CM | POA: Insufficient documentation

## 2024-05-15 DIAGNOSIS — M47816 Spondylosis without myelopathy or radiculopathy, lumbar region: Secondary | ICD-10-CM | POA: Insufficient documentation

## 2024-05-15 DIAGNOSIS — M545 Low back pain, unspecified: Secondary | ICD-10-CM | POA: Diagnosis not present

## 2024-05-15 NOTE — Discharge Instructions (Signed)

## 2024-05-15 NOTE — Therapy (Signed)
 OUTPATIENT PHYSICAL THERAPY THORACOLUMBAR EVALUATION   Patient Name: ONELL MCMATH MRN: [id number] DOB:1961-03-06, 63 y.o., female Today's Date: 05/15/2024  END OF SESSION:  PT End of Session - 05/15/24 1237     Visit Number 1    Date for PT Re-Evaluation 07/10/24    Authorization Type [place] [employer] no auth required    PT Start Time 1233    PT Stop Time 1314    PT Time Calculation (min) 41 min    Activity Tolerance Patient tolerated treatment well          Past Medical History:  Diagnosis Date   Arthritis    Asthma    long times ago- 12 yrs ago. no meds   Diabetes mellitus    GERD (gastroesophageal reflux disease)    Hypertension    Past Surgical History:  Procedure Laterality Date   ABDOMINAL HYSTERECTOMY     total   COLONOSCOPY WITH PROPOFOL  N/A 11/06/2015   Procedure: COLONOSCOPY WITH PROPOFOL ;  Surgeon: Lamar Bunk, MD;  Location: [facility];  Service: Endoscopy;  Laterality: N/A;   Patient Active Problem List   Diagnosis Date Noted   Hyperlipidemia associated with type 2 diabetes mellitus (HCC) 02/10/2023   Local edema 02/10/2023   Irritable bowel syndrome with both constipation and diarrhea 11/14/2022   Chronic midline low back pain without sciatica 11/14/2022   Paresthesia 11/14/2022   Type 2 diabetes mellitus without complication, without long-term current use of insulin (HCC) 11/12/2022   HTN (hypertension) 10/20/2013   Morbid obesity (HCC) 10/20/2013    PCP: Wendolyn Jenkins Jansky MD  REFERRING PROVIDER: Leonce Katz MD  REFERRING DIAG: M54.50, G89.29 chronic midline low back pain; M47.816 facet arthritis of lumbar region; M54.6, G89.29 chronic bil thoracic back pain  Rationale for Evaluation and Treatment: Rehabilitation  THERAPY DIAG:  Pain in thoracic spine  Other low back pain  Muscle weakness  ONSET DATE: > 6 months  SUBJECTIVE:                                                                                                                                                                                            SUBJECTIVE STATEMENT: Pt speaks Arabic and is here with her daughter [name] who translates.  The patient reports whole back pain also extending to her left upper trap.  Symptoms can come on and be there for several days straight.    PERTINENT HISTORY:  DM TII; HTN  PAIN:   Are you having pain? Yes NPRS scale: 5/10 Pain location: low back, mid and upper back, left upper trap Pain orientation: Bilateral  PAIN TYPE: aching  Pain description: intermittent  Aggravating factors: can't think of anything; raising hand when it's really painful; sometimes wakes up at night time 1-2x/week; pain will be 2-3 days straight Relieving factors: topical rubs; cat cow   PRECAUTIONS: None  WEIGHT BEARING RESTRICTIONS: No  FALLS:  Has patient fallen in last 6 months? No LIVING ENVIRONMENT: Lives with: lives with their family Lives in: House/apartment Stairs: Flight of 8 steps Has following equipment at home: None   OCCUPATION: Retired   PLOF: Independent   PATIENT GOALS: relief overall to continue being more active  The Patient-Specific Functional Scale  Initial:  I am going to ask you to identify up to 3 important activities that you are unable to do or are having difficulty with as a result of this problem.  Today are there any activities that you are unable to do or having difficulty with because of this?  (Patient shown scale and patient rated each activity)  Follow up: When you first came in you had difficulty performing these activities.  Today do you still have difficulty?  Patient-Specific activity scoring scheme (Point to one number):  0 1 2 3 4 5 6 7 8 9  10 Unable                                                                                                          Able to perform To perform                                                                                                     activity at the same Activity         Level as before                                                                                                                       Injury or problem  Activity      Pick up anything heavy  Initial:       0                  2.            Bending over                                                                      Initial:      0                  3.            Housework: vacumning, sweeping, cooking                   Initial:       3                    OBJECTIVE:  Note: Objective measures were completed at Evaluation unless otherwise noted.  DIAGNOSTIC FINDINGS:  02/16/23 lumbar MRI IMPRESSION: Lower lumbar facet arthropathy (greatest and severe on the right at L5-S1). Mild bilateral foraminal stenosis at L4-L5. No significant canal stenosis.  COGNITION: Overall cognitive status: Within functional limits for tasks assessed      POSTURE: decreased lumbar lordosis   LUMBAR ROM: able to stoop to pick up a small object off the floor,  quadruped cat/cow with report of shoulder pain  AROM eval  Flexion WFLs  Extension 25% limited  Right lateral flexion WFLs  Left lateral flexion WFLs  Right rotation   Left rotation    (Blank rows = not tested)  TRUNK STRENGTH:  Decreased activation of transverse abdominus muscles; abdominals 4-/5; decreased activation of lumbar multifidi; trunk extensors 4-/5   LOWER EXTREMITY ROM:   WFLs  LOWER EXTREMITY MMT:  Hip abductors and extensors 4/5 bil  LUMBAR SPECIAL TESTS:  Negative slump test  FUNCTIONAL TESTS:  30 sec STS hands on knees 8x  GAIT:  3 MWT: no device 644 feet, no pain  TREATMENT DATE: 05/15/24 evaluation                                                                                                                                 PATIENT EDUCATION:  Education details: Educated patient on anatomy and physiology of  current symptoms, prognosis, plan of care as well as initial self care strategies to promote recovery Person educated: Patient Education method: Explanation Education comprehension: verbalized understanding  HOME EXERCISE PROGRAM: To be started  ASSESSMENT:  CLINICAL IMPRESSION: Patient is a 63 y.o. female who was seen today for physical therapy evaluation and treatment for thoracic and lumbar back pain. The patient would benefit from PT to address strength deficits in  lumbo/pelvic and hip regions and pain levels that are currently affecting activities of daily living  including housework, gardening, lifting things (empties the bucket from a leak), bending over, and sleeping.     OBJECTIVE IMPAIRMENTS: decreased activity tolerance, decreased strength, impaired perceived functional ability, and pain.   ACTIVITY LIMITATIONS: carrying, lifting, bending, sleeping, and locomotion level  PARTICIPATION LIMITATIONS: meal prep, cleaning, laundry, and yard work  PERSONAL FACTORS: Age, Time since onset of injury/illness/exacerbation, and 1-2 comorbidities: diabetes, HTN are also affecting patient's functional outcome.   REHAB POTENTIAL: Good  CLINICAL DECISION MAKING: Stable/uncomplicated  EVALUATION COMPLEXITY: Low   GOALS: Goals reviewed with patient? Yes  SHORT TERM GOALS: Target date: 06/12/2024    The patient will demonstrate knowledge of basic self care strategies and exercises to promote healing  Baseline: Goal status: INITIAL  2.  Improved LE strength with 11 sit to stands in 30 sec Baseline:  Goal status: INITIAL  3.  Patient will be able to lift/carry a 5# object needed for housework and gardening Baseline:  Goal status: INITIAL  4.  PSFS rating with housework: cooking, vacumning, sweeping  4/10 Baseline:  Goal status: INITIAL    LONG TERM GOALS: Target date: 07/10/2024   The patient will be independent in a safe self progression of a home exercise program to  promote further recovery of function  Baseline:  Goal status: INITIAL  2.  Improved LE strength to 4+/5 needed for stooping to the ground or floor for gardening and housework Baseline:  Goal status: INITIAL  3.  The patient will have improved trunk flexor and extensor muscle strength to at least 4+/5 needed for lifting medium weight objects including emptying the water bucket   Baseline:  Goal status: INITIAL  4.  PSFS rating with housework: cooking, vacumning, sweeping  6/10 Baseline:  Goal status: INITIAL  5.  Able to walk 20 minutes 5x/week for spinal muscle conditioning and stamina Baseline:  Goal status: INITIAL   PLAN:  PT FREQUENCY: 2x/week  PT DURATION: 8 weeks  PLANNED INTERVENTIONS: 97164- PT Re-evaluation, 97110-Therapeutic exercises, 97530- Therapeutic activity, 97112- Neuromuscular re-education, 97535- Self Care, [id number]- Manual therapy, [id number]- Aquatic Therapy, [id number]- Electrical stimulation (unattended), [phone]- Electrical stimulation (manual), [id number]- Ultrasound, 79439 (1-2 muscles), 20561 (3+ muscles)- Dry Needling, Patient/Family education, Taping, Joint mobilization, Spinal mobilization, Cryotherapy, and Moist heat.  PLAN FOR NEXT SESSION: sidelying hip abduction, sit to stand, step ups, farmer's carries  Glade Pesa, PT 05/15/24 10:43 PM Phone: [phone] Fax: [phone]

## 2024-05-16 ENCOUNTER — Inpatient Hospital Stay
Admission: RE | Admit: 2024-05-16 | Discharge: 2024-05-16 | Disposition: A | Discharge status: Home / Self Care | Source: Ambulatory Visit | Attending: Sports Medicine | Admitting: Sports Medicine

## 2024-05-16 ENCOUNTER — Inpatient Hospital Stay: Admission: RE | Admit: 2024-05-16 | Source: Ambulatory Visit

## 2024-05-22 ENCOUNTER — Encounter: Payer: Self-pay | Admitting: Family Medicine

## 2024-05-23 ENCOUNTER — Other Ambulatory Visit (HOSPITAL_COMMUNITY): Payer: Self-pay

## 2024-05-23 ENCOUNTER — Telehealth: Payer: Self-pay

## 2024-05-23 NOTE — Telephone Encounter (Signed)
 Pharmacy Patient Advocate Encounter   Received notification from Onbase that prior authorization for Ozempic  (0.25 or 0.5 MG/DOSE) 2MG /3ML pen-injectors is required/requested.   Insurance verification completed.   The patient is insured through [facility] .   Per test claim: PA required; PA submitted to above mentioned insurance via Latent Key/confirmation #/EOC [id number] Status is pending

## 2024-05-24 ENCOUNTER — Ambulatory Visit: Attending: Sports Medicine | Admitting: Physical Therapy

## 2024-05-24 DIAGNOSIS — M6281 Muscle weakness (generalized): Secondary | ICD-10-CM | POA: Insufficient documentation

## 2024-05-24 DIAGNOSIS — M5459 Other low back pain: Secondary | ICD-10-CM | POA: Diagnosis present

## 2024-05-24 DIAGNOSIS — M546 Pain in thoracic spine: Secondary | ICD-10-CM | POA: Insufficient documentation

## 2024-05-24 DIAGNOSIS — R293 Abnormal posture: Secondary | ICD-10-CM | POA: Insufficient documentation

## 2024-05-24 NOTE — Therapy (Signed)
 OUTPATIENT PHYSICAL THERAPY THORACOLUMBAR PROGRESS NOTE      Patient Name: Heidi Davis MRN: [id number] DOB:June 18, 1961, 63 y.o.,, female Today's Date: 05/24/2024  END OF SESSION:  PT End of Session - 05/24/24 0858     Visit Number 2    Date for PT Re-Evaluation 07/10/24    Authorization Type [place] [employer] no auth required    PT Start Time [phone]   late   PT Stop Time 0929    PT Time Calculation (min) 31 min    Activity Tolerance Patient tolerated treatment well          Past Medical History:  Diagnosis Date   Arthritis    Asthma    long times ago- 12 yrs ago. no meds   Diabetes mellitus    GERD (gastroesophageal reflux disease)    Hypertension    Past Surgical History:  Procedure Laterality Date   ABDOMINAL HYSTERECTOMY     total   COLONOSCOPY WITH PROPOFOL  N/A 11/06/2015   Procedure: COLONOSCOPY WITH PROPOFOL ;  Surgeon: Lamar Bunk, MD;  Location: [facility];  Service: Endoscopy;  Laterality: N/A;   Patient Active Problem List   Diagnosis Date Noted   Hyperlipidemia associated with type 2 diabetes mellitus (HCC) 02/10/2023   Local edema 02/10/2023   Irritable bowel syndrome with both constipation and diarrhea 11/14/2022   Chronic midline low back pain without sciatica 11/14/2022   Paresthesia 11/14/2022   Type 2 diabetes mellitus without complication, without long-term current use of insulin (HCC) 11/12/2022   HTN (hypertension) 10/20/2013   Morbid obesity (HCC) 10/20/2013    PCP: Wendolyn Jenkins Jansky MD  REFERRING PROVIDER: Leonce Katz MD  REFERRING DIAG: M54.50, G89.29 chronic midline low back pain; M47.816 facet arthritis of lumbar region; M54.6, G89.29 chronic bil thoracic back pain  Rationale for Evaluation and Treatment: Rehabilitation  THERAPY DIAG:  Pain in thoracic spine  Other low back pain  Muscle weakness  ONSET DATE: > 6 months  SUBJECTIVE:                                                                                                                                                                                            SUBJECTIVE STATEMENT: Reports mid back pain today. Accompanied by another person who assists with translation.   Eval: Pt speaks Arabic and is here with her daughter [name] who translates.  PERTINENT HISTORY:  DM TII; HTN  PAIN:   Are you having pain? Yes NPRS scale: 5/10 Pain location:  mid and upper back Pain orientation: Bilateral  PAIN TYPE: aching Pain description: intermittent  Aggravating factors: can't think of anything;  raising hand when it's really painful; sometimes wakes up at night time 1-2x/week; pain will be 2-3 days straight Relieving factors: topical rubs; cat cow   PRECAUTIONS: None  WEIGHT BEARING RESTRICTIONS: No  FALLS:  Has patient fallen in last 6 months? No LIVING ENVIRONMENT: Lives with: lives with their family Lives in: House/apartment Stairs: Flight of 8 steps Has following equipment at home: None   OCCUPATION: Retired   PLOF: Independent   PATIENT GOALS: relief overall to continue being more active  The Patient-Specific Functional Scale  Initial:  I am going to ask you to identify up to 3 important activities that you are unable to do or are having difficulty with as a result of this problem.  Today are there any activities that you are unable to do or having difficulty with because of this?  (Patient shown scale and patient rated each activity)  Follow up: When you first came in you had difficulty performing these activities.  Today do you still have difficulty?  Patient-Specific activity scoring scheme (Point to one number):  0 1 2 3 4 5 6 7 8 9  10 Unable                                                                                                          Able to perform To perform                                                                                                    activity at the same Activity         Level  as before                                                                                                                       Injury or problem  Activity      Pick up anything heavy  Initial:       0                  2.            Bending over                                                                      Initial:      0                  3.            Housework: vacumning, sweeping, cooking                   Initial:       3                    OBJECTIVE:  Note: Objective measures were completed at Evaluation unless otherwise noted.  DIAGNOSTIC FINDINGS:  02/16/23 lumbar MRI IMPRESSION: Lower lumbar facet arthropathy (greatest and severe on the right at L5-S1). Mild bilateral foraminal stenosis at L4-L5. No significant canal stenosis.  COGNITION: Overall cognitive status: Within functional limits for tasks assessed      POSTURE: decreased lumbar lordosis   LUMBAR ROM: able to stoop to pick up a small object off the floor,  quadruped cat/cow with report of shoulder pain  AROM eval  Flexion WFLs  Extension 25% limited  Right lateral flexion WFLs  Left lateral flexion WFLs  Right rotation   Left rotation    (Blank rows = not tested)  TRUNK STRENGTH:  Decreased activation of transverse abdominus muscles; abdominals 4-/5; decreased activation of lumbar multifidi; trunk extensors 4-/5   LOWER EXTREMITY ROM:   WFLs  LOWER EXTREMITY MMT:  Hip abductors and extensors 4/5 bil  LUMBAR SPECIAL TESTS:  Negative slump test  FUNCTIONAL TESTS:  30 sec STS hands on knees 8x  GAIT:  3 MWT: no device 644 feet, no pain    TREATMENT DATE: 05/22/24 Nu-Step L5 4 minutes seat 9 while discussing status Standing with foam roll vertically on wall: cervical retractions, cervical rotations, bil UE elevation 10x Standing with foam roll horizontally on wall thoracic extension 10x Standing at the wall open books 10x  right/left Seated ball lumbo-thoracic stretch 3 ways Standing modified dead lifts to tall cone holding 8 pound weight 10x with verbal cues for forward gaze for neutral spine while hip hinging   Standing core strengthening series holding pair of 8 pound dumbbells while marching sets of 10 reps each:  1) Farmers hold; 2) single at the shoulder hold Farmer's carry down the hall 50 feet x2 carrying pair of 8 pound weights Sit to stand holding 8 pound weight 10x  Sit to stand holding 8 pound weight 2 sets of 5 Seated core series with 8 pound weight: hip to hip, hip to shoulder, ear to ear 10x each    TREATMENT DATE: 05/15/24 evaluation  PATIENT EDUCATION:  Education details: Educated patient on anatomy and physiology of current symptoms, prognosis, plan of care as well as initial self care strategies to promote recovery Person educated: Patient Education method: Explanation Education comprehension: verbalized understanding  HOME EXERCISE PROGRAM: To be started  ASSESSMENT:  CLINICAL IMPRESSION: The patient is able to perform spinal muscle endurance exercise emphasizing thoracic and lumbar multifidi.  Instruction given in hip hinging /dead lift technique needed for safe lifting capacity of household items and for posterior chain strengthening of lumbar and lower extremity musculature.  Verbal cues to keep the weight close to the body, hip and shoulders to rise together and for forward gaze needed for neutral spine alignment.   She reports her back feels good end of session.   OBJECTIVE IMPAIRMENTS: decreased activity tolerance, decreased strength, impaired perceived functional ability, and pain.   ACTIVITY LIMITATIONS: carrying, lifting, bending, sleeping, and locomotion level  PARTICIPATION LIMITATIONS: meal prep, cleaning, laundry, and yard work  PERSONAL  FACTORS: Age, Time since onset of injury/illness/exacerbation, and 1-2 comorbidities: diabetes, HTN are also affecting patient's functional outcome.   REHAB POTENTIAL: Good  CLINICAL DECISION MAKING: Stable/uncomplicated  EVALUATION COMPLEXITY: Low   GOALS: Goals reviewed with patient? Yes  SHORT TERM GOALS: Target date: 06/12/2024    The patient will demonstrate knowledge of basic self care strategies and exercises to promote healing  Baseline: Goal status: INITIAL  2.  Improved LE strength with 11 sit to stands in 30 sec Baseline:  Goal status: INITIAL  3.  Patient will be able to lift/carry a 5# object needed for housework and gardening Baseline:  Goal status: INITIAL  4.  PSFS rating with housework: cooking, vacumning, sweeping  4/10 Baseline:  Goal status: INITIAL    LONG TERM GOALS: Target date: 07/10/2024   The patient will be independent in a safe self progression of a home exercise program to promote further recovery of function  Baseline:  Goal status: INITIAL  2.  Improved LE strength to 4+/5 needed for stooping to the ground or floor for gardening and housework Baseline:  Goal status: INITIAL  3.  The patient will have improved trunk flexor and extensor muscle strength to at least 4+/5 needed for lifting medium weight objects including emptying the water bucket   Baseline:  Goal status: INITIAL  4.  PSFS rating with housework: cooking, vacumning, sweeping  6/10 Baseline:  Goal status: INITIAL  5.  Able to walk 20 minutes 5x/week for spinal muscle conditioning and stamina Baseline:  Goal status: INITIAL   PLAN:  PT FREQUENCY: 2x/week  PT DURATION: 8 weeks  PLANNED INTERVENTIONS: 97164- PT Re-evaluation, 97110-Therapeutic exercises, 97530- Therapeutic activity, 97112- Neuromuscular re-education, 97535- Self Care, 02859- Manual therapy, [id number]- Aquatic Therapy, H9716- Electrical stimulation (unattended), [id number]- Electrical stimulation (manual),  [id number]- Ultrasound, 79439 (1-2 muscles), 20561 (3+ muscles)- Dry Needling, Patient/Family education, Taping, Joint mobilization, Spinal mobilization, Cryotherapy, and Moist heat.  PLAN FOR NEXT SESSION: limited time secondary to late arrival start HEP next visit;  sit to stand, step ups, farmer's carries  Glade Pesa, PT 05/24/24 7:21 PM Phone: [phone] Fax: [phone]

## 2024-05-24 NOTE — Telephone Encounter (Signed)
 Pharmacy Patient Advocate Encounter  Received notification from [employer] that Prior Authorization for Ozempic  (0.25 or 0.5 MG/DOSE) 2MG /3ML pen-injectors has been APPROVED from 05/23/24 to 05/23/25   PA #/Case ID/Reference #: [id number]

## 2024-05-24 NOTE — Telephone Encounter (Signed)
 Noted. Patient notified of message below.

## 2024-05-29 ENCOUNTER — Other Ambulatory Visit: Payer: Self-pay | Admitting: Sports Medicine

## 2024-05-29 ENCOUNTER — Ambulatory Visit: Admitting: Physical Therapy

## 2024-05-29 DIAGNOSIS — M546 Pain in thoracic spine: Secondary | ICD-10-CM

## 2024-05-29 DIAGNOSIS — M5459 Other low back pain: Secondary | ICD-10-CM

## 2024-05-29 DIAGNOSIS — M6281 Muscle weakness (generalized): Secondary | ICD-10-CM

## 2024-05-29 NOTE — Therapy (Signed)
 OUTPATIENT PHYSICAL THERAPY THORACOLUMBAR PROGRESS NOTE      Patient Name: Heidi Davis MRN: [id number] DOB:[birth year], 63 y.o., female Today's Date: 05/29/2024  END OF SESSION:  PT End of Session - 05/29/24 1018     Visit Number 3    Date for PT Re-Evaluation 07/10/24    Authorization Type [place] Medicaid [employer] Community no auth required    PT Start Time 1018    PT Stop Time 1056    PT Time Calculation (min) 38 min    Activity Tolerance Patient tolerated treatment well          Past Medical History:  Diagnosis Date   Arthritis    Asthma    long times ago- 12 yrs ago. no meds   Diabetes mellitus    GERD (gastroesophageal reflux disease)    Hypertension    Past Surgical History:  Procedure Laterality Date   ABDOMINAL HYSTERECTOMY     total   COLONOSCOPY WITH PROPOFOL  N/A 11/06/2015   Procedure: COLONOSCOPY WITH PROPOFOL ;  Surgeon: Heidi Bunk, MD;  Location: [facility];  Service: Endoscopy;  Laterality: N/A;   Patient Active Problem List   Diagnosis Date Noted   Hyperlipidemia associated with type 2 diabetes mellitus (HCC) 02/10/2023   Local edema 02/10/2023   Irritable bowel syndrome with both constipation and diarrhea 11/14/2022   Chronic midline low back pain without sciatica 11/14/2022   Paresthesia 11/14/2022   Type 2 diabetes mellitus without complication, without long-term current use of insulin (HCC) 11/12/2022   HTN (hypertension) 10/20/2013   Morbid obesity (HCC) 10/20/2013    PCP: Heidi Jenkins Jansky MD  REFERRING PROVIDER: Leonce Katz MD  REFERRING DIAG: M54.50, G89.29 chronic midline low back pain; M47.816 facet arthritis of lumbar region; M54.6, G89.29 chronic bil thoracic back pain  Rationale for Evaluation and Treatment: Rehabilitation  THERAPY DIAG:  Pain in thoracic spine  Other low back pain  Muscle weakness  ONSET DATE: > 6 months  SUBJECTIVE:                                                                                                                                                                                            SUBJECTIVE STATEMENT: Reports no pain today.  Reports it's good when questioned about her response to previous session. Accompanied by her daughter who assists with translation.  She reports she has some weights at home, unsure how heavy they are, maybe 5 pounds  Eval: Pt speaks Arabic and is here with her daughter Heidi Davis who translates.  PERTINENT HISTORY:  DM TII; HTN  PAIN:   Are you having pain? Yes NPRS scale:  0/10 Pain location:  mid and upper back Pain orientation: Bilateral  PAIN TYPE: aching Pain description: intermittent  Aggravating factors: can't think of anything; raising hand when it's really painful; sometimes wakes up at night time 1-2x/week; pain will be 2-3 days straight Relieving factors: topical rubs; cat cow   PRECAUTIONS: None  WEIGHT BEARING RESTRICTIONS: No  FALLS:  Has patient fallen in last 6 months? No LIVING ENVIRONMENT: Lives with: lives with their family Lives in: House/apartment Stairs: Flight of 8 steps Has following equipment at home: None   OCCUPATION: Retired   PLOF: Independent   PATIENT GOALS: relief overall to continue being more active  The Patient-Specific Functional Scale  Initial:  I am going to ask you to identify up to 3 important activities that you are unable to do or are having difficulty with as a result of this problem.  Today are there any activities that you are unable to do or having difficulty with because of this?  (Patient shown scale and patient rated each activity)  Follow up: When you first came in you had difficulty performing these activities.  Today do you still have difficulty?  Patient-Specific activity scoring scheme (Point to one number):  0 1 2 3 4 5 6 7 8 9  10 Unable                                                                                                          Able to perform To  perform                                                                                                    activity at the same Activity         Level as before                                                                                                                       Injury or problem  Activity      Pick up anything heavy  Initial:       0                  2.            Bending over                                                                      Initial:      0                  3.            Housework: vacumning, sweeping, cooking                   Initial:       3                    OBJECTIVE:  Note: Objective measures were completed at Evaluation unless otherwise noted.  DIAGNOSTIC FINDINGS:  02/16/23 lumbar MRI IMPRESSION: Lower lumbar facet arthropathy (greatest and severe on the right at L5-S1). Mild bilateral foraminal stenosis at L4-L5. No significant canal stenosis.  COGNITION: Overall cognitive status: Within functional limits for tasks assessed      POSTURE: decreased lumbar lordosis   LUMBAR ROM: able to stoop to pick up a small object off the floor,  quadruped cat/cow with report of shoulder pain  AROM eval  Flexion WFLs  Extension 25% limited  Right lateral flexion WFLs  Left lateral flexion WFLs  Right rotation   Left rotation    (Blank rows = not tested)  TRUNK STRENGTH:  Decreased activation of transverse abdominus muscles; abdominals 4-/5; decreased activation of lumbar multifidi; trunk extensors 4-/5   LOWER EXTREMITY ROM:   WFLs  LOWER EXTREMITY MMT:  Hip abductors and extensors 4/5 bil  LUMBAR SPECIAL TESTS:  Negative slump test  FUNCTIONAL TESTS:  30 sec STS hands on knees 8x  GAIT:  3 MWT: no device 644 feet, no pain  TREATMENT DATE: 05/29/24 UBE 4 min ( 2 min each direction) while discussing status At the stairs 2nd step hip flexor, gastroc and quadratus lumborum  muscle lengthening 2 sets of 5 right/left: Rocking forward and back with arm elevation Rocking forward and back with arm reach up and over At the stairs 2nd step hamstring stretch 5x right/left (Added to HEP- see below) 6 inch step ups holding single 5# dumbbell 10x right/left  Seated red power cord anchored under feet, pt cued to sit tall 10x Farmer's carry around gym pair of 8# 60 feet 2 laps (Added to HEP- see below) Farmer's hold 8# march 10x Pair of 8# dead lift to knee level 10x (Added to HEP- see below) Chair sit ups single 8# with overhead press 10x (challenging) Seated thoracic extension with ball hands behind neck 10x  Seated red band shoulder horizontal abduction 2x10 (Added to HEP- see below)    TREATMENT DATE: 05/22/24 Nu-Step L5 4 minutes seat 9 while discussing status Standing with foam roll vertically on wall: cervical retractions, cervical rotations, bil UE elevation 10x Standing with foam roll horizontally on wall thoracic extension 10x Standing at the wall open books 10x right/left Seated ball lumbo-thoracic stretch 3 ways Standing modified dead lifts to tall cone  holding 8 pound weight 10x with verbal cues for forward gaze for neutral spine while hip hinging   Standing core strengthening series holding pair of 8 pound dumbbells while marching sets of 10 reps each:  1) Farmers hold; 2) single at the shoulder hold Farmer's carry down the hall 50 feet x2 carrying pair of 8 pound weights Sit to stand holding 8 pound weight 10x  Sit to stand holding 8 pound weight 2 sets of 5 Seated core series with 8 pound weight: hip to hip, hip to shoulder, ear to ear 10x each    TREATMENT DATE: 05/15/24 evaluation                                                                                                                                 PATIENT EDUCATION:  Education details: Educated patient on anatomy and physiology of current symptoms, prognosis, plan of care as well as  initial self care strategies to promote recovery Person educated: Patient Education method: Explanation Education comprehension: verbalized understanding  HOME EXERCISE PROGRAM: Access Code: [email or web address] URL: [email or web address] Date: 05/29/2024 Prepared by: Glade Pesa  Exercises - Standing Hamstring Stretch with Step  - 1 x daily - 7 x weekly - 1 sets - 3 reps - 30 hold - Resisted Sit-to-Stand With Dumbbell at Chest  - 1 x daily - 7 x weekly - 1 sets - 10 reps - Farmer's Carry with Kettlebells  - 1 x daily - 7 x weekly - 1 sets - 10 reps - Half Deadlift with Kettlebell  - 1 x daily - 7 x weekly - 1 sets - 10 reps - Seated Shoulder Horizontal Abduction with Resistance  - 1 x daily - 7 x weekly - 1 sets - 10 reps  ASSESSMENT:  CLINICAL IMPRESSION: Unknown reports no back pain on arrival, during or following treatment session.  She does exhibit signs of general fatigue toward the end of session with smaller ranges of motion and less awareness of technique.  Treatment emphasis on spinal muscle endurance and strengthening needed for gardening tasks, emptying buckets of water and doing housework. Therapist using demonstration of movements secondary to language barrier.  HEP initiated as above.    OBJECTIVE IMPAIRMENTS: decreased activity tolerance, decreased strength, impaired perceived functional ability, and pain.   ACTIVITY LIMITATIONS: carrying, lifting, bending, sleeping, and locomotion level  PARTICIPATION LIMITATIONS: meal prep, cleaning, laundry, and yard work  PERSONAL FACTORS: Age, Time since onset of injury/illness/exacerbation, and 1-2 comorbidities: diabetes, HTN are also affecting patient's functional outcome.   REHAB POTENTIAL: Good  CLINICAL DECISION MAKING: Stable/uncomplicated  EVALUATION COMPLEXITY: Low   GOALS: Goals reviewed with patient? Yes  SHORT TERM GOALS: Target date: 06/12/2024    The patient will demonstrate knowledge of basic self care  strategies and exercises to promote healing  Baseline: Goal status: INITIAL  2.  Improved LE strength with 11 sit to stands in 30 sec Baseline:  Goal status: INITIAL  3.  Patient will be able to lift/carry a 5# object needed for housework and gardening Baseline:  Goal status: INITIAL  4.  PSFS rating with housework: cooking, vacumning, sweeping  4/10 Baseline:  Goal status: INITIAL    LONG TERM GOALS: Target date: 07/10/2024   The patient will be independent in a safe self progression of a home exercise program to promote further recovery of function  Baseline:  Goal status: INITIAL  2.  Improved LE strength to 4+/5 needed for stooping to the ground or floor for gardening and housework Baseline:  Goal status: INITIAL  3.  The patient will have improved trunk flexor and extensor muscle strength to at least 4+/5 needed for lifting medium weight objects including emptying the water bucket   Baseline:  Goal status: INITIAL  4.  PSFS rating with housework: cooking, vacumning, sweeping  6/10 Baseline:  Goal status: INITIAL  5.  Able to walk 20 minutes 5x/week for spinal muscle conditioning and stamina Baseline:  Goal status: INITIAL   PLAN:  PT FREQUENCY: 2x/week  PT DURATION: 8 weeks  PLANNED INTERVENTIONS: 97164- PT Re-evaluation, 97110-Therapeutic exercises, 97530- Therapeutic activity, 97112- Neuromuscular re-education, 97535- Self Care, 02859- Manual therapy, [id number]- Aquatic Therapy, H9716- Electrical stimulation (unattended), [phone]- Electrical stimulation (manual), [id number]- Ultrasound, 79439 (1-2 muscles), 20561 (3+ muscles)- Dry Needling, Patient/Family education, Taping, Joint mobilization, Spinal mobilization, Cryotherapy, and Moist heat.  PLAN FOR NEXT SESSION:   try landmine press for simulation of vacuuming; sit to stand, step ups, farmer's carries, lifting; spinal muscle strengthening and endurance  Glade Pesa, PT 05/29/24 12:08 PM Phone:  [phone] Fax: [phone]

## 2024-06-01 ENCOUNTER — Ambulatory Visit: Admitting: Physical Therapy

## 2024-06-01 NOTE — Discharge Instructions (Signed)

## 2024-06-04 ENCOUNTER — Other Ambulatory Visit

## 2024-06-04 ENCOUNTER — Inpatient Hospital Stay
Admission: RE | Admit: 2024-06-04 | Discharge: 2024-06-04 | Disposition: A | Discharge status: Home / Self Care | Source: Ambulatory Visit | Attending: Sports Medicine | Admitting: Sports Medicine

## 2024-06-06 ENCOUNTER — Ambulatory Visit: Admitting: Physical Therapy

## 2024-06-06 ENCOUNTER — Encounter: Payer: Self-pay | Admitting: Physical Therapy

## 2024-06-06 DIAGNOSIS — M6281 Muscle weakness (generalized): Secondary | ICD-10-CM

## 2024-06-06 DIAGNOSIS — M5459 Other low back pain: Secondary | ICD-10-CM

## 2024-06-06 DIAGNOSIS — M546 Pain in thoracic spine: Secondary | ICD-10-CM

## 2024-06-06 DIAGNOSIS — R293 Abnormal posture: Secondary | ICD-10-CM

## 2024-06-06 NOTE — Discharge Instructions (Signed)

## 2024-06-06 NOTE — Therapy (Signed)
 OUTPATIENT PHYSICAL THERAPY THORACOLUMBAR PROGRESS NOTE      Patient Name: Heidi Davis MRN: [id number] DOB:1961/08/31, 63 y.o., female Today's Date: 06/06/2024  END OF SESSION:  PT End of Session - 06/06/24 1011     Visit Number 4    Date for PT Re-Evaluation 07/10/24    Authorization Type [place] [employer] no auth required    PT Start Time 1012    PT Stop Time 1055    PT Time Calculation (min) 43 min    Activity Tolerance Patient tolerated treatment well          Past Medical History:  Diagnosis Date   Arthritis    Asthma    long times ago- 12 yrs ago. no meds   Diabetes mellitus    GERD (gastroesophageal reflux disease)    Hypertension    Past Surgical History:  Procedure Laterality Date   ABDOMINAL HYSTERECTOMY     total   COLONOSCOPY WITH PROPOFOL  N/A 11/06/2015   Procedure: COLONOSCOPY WITH PROPOFOL ;  Surgeon: Lamar Bunk, MD;  Location: [facility];  Service: Endoscopy;  Laterality: N/A;   Patient Active Problem List   Diagnosis Date Noted   Hyperlipidemia associated with type 2 diabetes mellitus (HCC) 02/10/2023   Local edema 02/10/2023   Irritable bowel syndrome with both constipation and diarrhea 11/14/2022   Chronic midline low back pain without sciatica 11/14/2022   Paresthesia 11/14/2022   Type 2 diabetes mellitus without complication, without long-term current use of insulin (HCC) 11/12/2022   HTN (hypertension) 10/20/2013   Morbid obesity (HCC) 10/20/2013    PCP: Wendolyn Jenkins Jansky MD  REFERRING PROVIDER: Leonce Katz MD  REFERRING DIAG: M54.50, G89.29 chronic midline low back pain; M47.816 facet arthritis of lumbar region; M54.6, G89.29 chronic bil thoracic back pain  Rationale for Evaluation and Treatment: Rehabilitation  THERAPY DIAG:  Pain in thoracic spine  Other low back pain  Muscle weakness  Abnormal posture  ONSET DATE: > 6 months  SUBJECTIVE:                                                                                                                                                                                            SUBJECTIVE STATEMENT: Denies pain at the moment.  States her back has been good.  When asked about her response to last session she reports a little soreness.     Eval: Pt speaks Arabic and is here with her daughter [name] who translates.  PERTINENT HISTORY:  DM TII; HTN  PAIN:   Are you having pain? Yes NPRS scale: 0/10 Pain location:  mid and upper back Pain  orientation: Bilateral  PAIN TYPE: aching Pain description: intermittent  Aggravating factors: can't think of anything; raising hand when it's really painful; sometimes wakes up at night time 1-2x/week; pain will be 2-3 days straight Relieving factors: topical rubs; cat cow   PRECAUTIONS: None  WEIGHT BEARING RESTRICTIONS: No  FALLS:  Has patient fallen in last 6 months? No LIVING ENVIRONMENT: Lives with: lives with their family Lives in: House/apartment Stairs: Flight of 8 steps Has following equipment at home: None   OCCUPATION: Retired   PLOF: Independent   PATIENT GOALS: relief overall to continue being more active  The Patient-Specific Functional Scale  Initial:  I am going to ask you to identify up to 3 important activities that you are unable to do or are having difficulty with as a result of this problem.  Today are there any activities that you are unable to do or having difficulty with because of this?  (Patient shown scale and patient rated each activity)  Follow up: When you first came in you had difficulty performing these activities.  Today do you still have difficulty?  Patient-Specific activity scoring scheme (Point to one number):  0 1 2 3 4 5 6 7 8 9  10 Unable                                                                                                          Able to perform To perform                                                                                                     activity at the same Activity         Level as before                                                                                                                       Injury or problem  Activity      Pick up anything heavy  Initial:       0                  2.            Bending over                                                                      Initial:      0                  3.            Housework: vacumning, sweeping, cooking                   Initial:       3                    OBJECTIVE:  Note: Objective measures were completed at Evaluation unless otherwise noted.  DIAGNOSTIC FINDINGS:  02/16/23 lumbar MRI IMPRESSION: Lower lumbar facet arthropathy (greatest and severe on the right at L5-S1). Mild bilateral foraminal stenosis at L4-L5. No significant canal stenosis.  COGNITION: Overall cognitive status: Within functional limits for tasks assessed      POSTURE: decreased lumbar lordosis   LUMBAR ROM: able to stoop to pick up a small object off the floor,  quadruped cat/cow with report of shoulder pain  AROM eval  Flexion WFLs  Extension 25% limited  Right lateral flexion WFLs  Left lateral flexion WFLs  Right rotation   Left rotation    (Blank rows = not tested)  TRUNK STRENGTH:  Decreased activation of transverse abdominus muscles; abdominals 4-/5; decreased activation of lumbar multifidi; trunk extensors 4-/5   LOWER EXTREMITY ROM:   WFLs  LOWER EXTREMITY MMT:  Hip abductors and extensors 4/5 bil  LUMBAR SPECIAL TESTS:  Negative slump test  FUNCTIONAL TESTS:  30 sec STS hands on knees 8x  GAIT:  3 MWT: no device 644 feet, no pain  TREATMENT DATE: 06/06/24 Nu-Step L 5 5 min while discussing response and current status Pt requested after warm up to exercise in the treatment room Seated stretches: HS, side bending over peanut ball, seated fig 4 Seated push down on  peanut ball to activate abdominals 10x Seated 4# plyo ball chops 10x, ear to ear Sit to stand with 4# overhead press 2 sets of 5 Seated landmine press red power cord 10x right/left Standing landmine press red power cord 10x right/left Seated hip hinge with one hand on chair: 5# row 10x right/left Standing core strengthening series holding pair of 5 pound dumbbells while marching sets of 5-8 reps each:  1) Farmers hold; 2) single at the shoulder hold; 3) single overhead press hold  Dead lifts 10# 10x with cues to keep weight close to the body and forward gaze Counter push ups 10x   TREATMENT DATE: 05/29/24 UBE 4 min ( 2 min each direction) while discussing status At the stairs 2nd step hip flexor, gastroc and quadratus lumborum muscle lengthening 2 sets of 5 right/left: Rocking forward and back with arm elevation Rocking forward and back with arm reach up and over At the stairs 2nd step hamstring stretch 5x right/left (Added to HEP- see below) 6 inch step  ups holding single 5# dumbbell 10x right/left  Seated red power cord anchored under feet, pt cued to sit tall 10x Farmer's carry around gym pair of 8# 60 feet 2 laps (Added to HEP- see below) Farmer's hold 8# march 10x Pair of 8# dead lift to knee level 10x (Added to HEP- see below) Chair sit ups single 8# with overhead press 10x (challenging) Seated thoracic extension with ball hands behind neck 10x  Seated red band shoulder horizontal abduction 2x10 (Added to HEP- see below)    TREATMENT DATE: 05/22/24 Nu-Step L5 4 minutes seat 9 while discussing status Standing with foam roll vertically on wall: cervical retractions, cervical rotations, bil UE elevation 10x Standing with foam roll horizontally on wall thoracic extension 10x Standing at the wall open books 10x right/left Seated ball lumbo-thoracic stretch 3 ways Standing modified dead lifts to tall cone holding 8 pound weight 10x with verbal cues for forward gaze for neutral spine  while hip hinging   Standing core strengthening series holding pair of 8 pound dumbbells while marching sets of 10 reps each:  1) Farmers hold; 2) single at the shoulder hold Farmer's carry down the hall 50 feet x2 carrying pair of 8 pound weights Sit to stand holding 8 pound weight 10x  Sit to stand holding 8 pound weight 2 sets of 5 Seated core series with 8 pound weight: hip to hip, hip to shoulder, ear to ear 10x each    TREATMENT DATE: 05/15/24 evaluation                                                                                                                                 PATIENT EDUCATION:  Education details: Educated patient on anatomy and physiology of current symptoms, prognosis, plan of care as well as initial self care strategies to promote recovery Person educated: Patient Education method: Explanation Education comprehension: verbalized understanding  HOME EXERCISE PROGRAM: Access Code: [email or web address] URL: [email or web address] Date: 05/29/2024 Prepared by: Glade Pesa  Exercises - Standing Hamstring Stretch with Step  - 1 x daily - 7 x weekly - 1 sets - 3 reps - 30 hold - Resisted Sit-to-Stand With Dumbbell at Chest  - 1 x daily - 7 x weekly - 1 sets - 10 reps - Farmer's Carry with Kettlebells  - 1 x daily - 7 x weekly - 1 sets - 10 reps - Half Deadlift with Kettlebell  - 1 x daily - 7 x weekly - 1 sets - 10 reps - Seated Shoulder Horizontal Abduction with Resistance  - 1 x daily - 7 x weekly - 1 sets - 10 reps  ASSESSMENT:  CLINICAL IMPRESSION: Right side hip stiffness noted with figure 4 stretch.  Instruction given in hip hinging /dead lift technique with moderate verbal cues to keep the weight close to the body and for forward gaze needed for neutral spine alignment. Exercises to simulate therapeutic activities performed to help with vacuuming  and sweeping as well as picking up objects from the floor. Therapist closely monitoring response  throughout the treatment session.    OBJECTIVE IMPAIRMENTS: decreased activity tolerance, decreased strength, impaired perceived functional ability, and pain.   ACTIVITY LIMITATIONS: carrying, lifting, bending, sleeping, and locomotion level  PARTICIPATION LIMITATIONS: meal prep, cleaning, laundry, and yard work  PERSONAL FACTORS: Age, Time since onset of injury/illness/exacerbation, and 1-2 comorbidities: diabetes, HTN are also affecting patient's functional outcome.   REHAB POTENTIAL: Good  CLINICAL DECISION MAKING: Stable/uncomplicated  EVALUATION COMPLEXITY: Low   GOALS: Goals reviewed with patient? Yes  SHORT TERM GOALS: Target date: 06/12/2024    The patient will demonstrate knowledge of basic self care strategies and exercises to promote healing  Baseline: Goal status: INITIAL  2.  Improved LE strength with 11 sit to stands in 30 sec Baseline:  Goal status: INITIAL  3.  Patient will be able to lift/carry a 5# object needed for housework and gardening Baseline:  Goal status: INITIAL  4.  PSFS rating with housework: cooking, vacumning, sweeping  4/10 Baseline:  Goal status: INITIAL    LONG TERM GOALS: Target date: 07/10/2024   The patient will be independent in a safe self progression of a home exercise program to promote further recovery of function  Baseline:  Goal status: INITIAL  2.  Improved LE strength to 4+/5 needed for stooping to the ground or floor for gardening and housework Baseline:  Goal status: INITIAL  3.  The patient will have improved trunk flexor and extensor muscle strength to at least 4+/5 needed for lifting medium weight objects including emptying the water bucket   Baseline:  Goal status: INITIAL  4.  PSFS rating with housework: cooking, vacumning, sweeping  6/10 Baseline:  Goal status: INITIAL  5.  Able to walk 20 minutes 5x/week for spinal muscle conditioning and stamina Baseline:  Goal status: INITIAL   PLAN:  PT  FREQUENCY: 2x/week  PT DURATION: 8 weeks  PLANNED INTERVENTIONS: 97164- PT Re-evaluation, 97110-Therapeutic exercises, 97530- Therapeutic activity, 97112- Neuromuscular re-education, 97535- Self Care, 02859- Manual therapy, [id number]- Aquatic Therapy, H9716- Electrical stimulation (unattended), [phone]- Electrical stimulation (manual), [id number]- Ultrasound, 79439 (1-2 muscles), 20561 (3+ muscles)- Dry Needling, Patient/Family education, Taping, Joint mobilization, Spinal mobilization, Cryotherapy, and Moist heat.  PLAN FOR NEXT SESSION:pt having spinal injection tomorrow, will cancel Friday appt and follow up next week;  pt requests after warm up to be treated in a treatment room;   landmine press for simulation of vacuuming; sit to stand, step ups, farmer's carries, lifting; spinal muscle strengthening and endurance  Glade Pesa, PT 06/06/24 10:54 AM Phone: [phone] Fax: [phone]

## 2024-06-07 ENCOUNTER — Ambulatory Visit
Admission: RE | Admit: 2024-06-07 | Discharge: 2024-06-07 | Disposition: A | Discharge status: Home / Self Care | Source: Ambulatory Visit | Attending: Sports Medicine | Admitting: Sports Medicine

## 2024-06-07 DIAGNOSIS — M47816 Spondylosis without myelopathy or radiculopathy, lumbar region: Secondary | ICD-10-CM

## 2024-06-07 MED ORDER — IOPAMIDOL (ISOVUE-M 200) INJECTION 41%
1.0000 mL | Freq: Once | INTRAMUSCULAR | Status: AC
Start: 2024-06-07 — End: 2024-06-07
  Administered 2024-06-07: 1 mL via INTRA_ARTICULAR

## 2024-06-07 MED ORDER — METHYLPREDNISOLONE ACETATE 40 MG/ML INJ SUSP (RADIOLOG
80.0000 mg | Freq: Once | INTRAMUSCULAR | Status: AC
  Administered 2024-06-07: 80 mg via INTRA_ARTICULAR

## 2024-06-08 ENCOUNTER — Encounter: Admitting: Physical Therapy

## 2024-06-12 ENCOUNTER — Ambulatory Visit: Admitting: Physical Therapy

## 2024-06-12 DIAGNOSIS — M5459 Other low back pain: Secondary | ICD-10-CM

## 2024-06-12 DIAGNOSIS — M6281 Muscle weakness (generalized): Secondary | ICD-10-CM

## 2024-06-12 DIAGNOSIS — M546 Pain in thoracic spine: Secondary | ICD-10-CM

## 2024-06-12 NOTE — Therapy (Signed)
 OUTPATIENT PHYSICAL THERAPY THORACOLUMBAR PROGRESS NOTE      Patient Name: Heidi Davis MRN: [id number] DOB:[birth year], 63 y.o., female Today's Date: 06/12/2024  END OF SESSION:  PT End of Session - 06/12/24 0934     Visit Number 5    Date for Recertification  07/10/24    Authorization Type [place] Medicaid [employer] Community no auth required    PT Start Time [phone]    PT Stop Time 1014    PT Time Calculation (min) 43 min    Activity Tolerance Patient tolerated treatment well          Past Medical History:  Diagnosis Date   Arthritis    Asthma    long times ago- 12 yrs ago. no meds   Diabetes mellitus    GERD (gastroesophageal reflux disease)    Hypertension    Past Surgical History:  Procedure Laterality Date   ABDOMINAL HYSTERECTOMY     total   COLONOSCOPY WITH PROPOFOL  N/A 11/06/2015   Procedure: COLONOSCOPY WITH PROPOFOL ;  Surgeon: Lamar Bunk, MD;  Location: [facility];  Service: Endoscopy;  Laterality: N/A;   Patient Active Problem List   Diagnosis Date Noted   Hyperlipidemia associated with type 2 diabetes mellitus (HCC) 02/10/2023   Local edema 02/10/2023   Irritable bowel syndrome with both constipation and diarrhea 11/14/2022   Chronic midline low back pain without sciatica 11/14/2022   Paresthesia 11/14/2022   Type 2 diabetes mellitus without complication, without long-term current use of insulin (HCC) 11/12/2022   HTN (hypertension) 10/20/2013   Morbid obesity (HCC) 10/20/2013    PCP: Wendolyn Jenkins Jansky MD  REFERRING PROVIDER: Leonce Katz MD  REFERRING DIAG: M54.50, G89.29 chronic midline low back pain; M47.816 facet arthritis of lumbar region; M54.6, G89.29 chronic bil thoracic back pain  Rationale for Evaluation and Treatment: Rehabilitation  THERAPY DIAG:  Pain in thoracic spine  Other low back pain  Muscle weakness  ONSET DATE: > 6 months  SUBJECTIVE:                                                                                                                                                                                            SUBJECTIVE STATEMENT:  Little bit of back pain today.  Stated she did fine after last time Eval: Pt speaks Arabic and is here with her daughter [name] who translates.  PERTINENT HISTORY:  DM TII; HTN  PAIN:   Are you having pain? Yes NPRS scale: :little bit/10 Pain location:  mid and upper back Pain orientation: Bilateral  PAIN TYPE: aching Pain description: intermittent  Aggravating factors: can't think of anything; raising  hand when it's really painful; sometimes wakes up at night time 1-2x/week; pain will be 2-3 days straight Relieving factors: topical rubs; cat cow   PRECAUTIONS: None  WEIGHT BEARING RESTRICTIONS: No  FALLS:  Has patient fallen in last 6 months? No LIVING ENVIRONMENT: Lives with: lives with their family Lives in: House/apartment Stairs: Flight of 8 steps Has following equipment at home: None   OCCUPATION: Retired   PLOF: Independent   PATIENT GOALS: relief overall to continue being more active  The Patient-Specific Functional Scale  Initial:  I am going to ask you to identify up to 3 important activities that you are unable to do or are having difficulty with as a result of this problem.  Today are there any activities that you are unable to do or having difficulty with because of this?  (Patient shown scale and patient rated each activity)  Follow up: When you first came in you had difficulty performing these activities.  Today do you still have difficulty?  Patient-Specific activity scoring scheme (Point to one number):  0 1 2 3 4 5 6 7 8 9  10 Unable                                                                                                          Able to perform To perform                                                                                                    activity at the same Activity         Level as before                                                                                                                        Injury or problem  Activity      Pick up anything heavy  Initial:       0                  2.            Bending over                                                                      Initial:      0                  3.            Housework: vacumning, sweeping, cooking                   Initial:       3                    OBJECTIVE:  Note: Objective measures were completed at Evaluation unless otherwise noted.  DIAGNOSTIC FINDINGS:  02/16/23 lumbar MRI IMPRESSION: Lower lumbar facet arthropathy (greatest and severe on the right at L5-S1). Mild bilateral foraminal stenosis at L4-L5. No significant canal stenosis.  COGNITION: Overall cognitive status: Within functional limits for tasks assessed      POSTURE: decreased lumbar lordosis   LUMBAR ROM: able to stoop to pick up a small object off the floor,  quadruped cat/cow with report of shoulder pain  AROM eval  Flexion WFLs  Extension 25% limited  Right lateral flexion WFLs  Left lateral flexion WFLs  Right rotation   Left rotation    (Blank rows = not tested)  TRUNK STRENGTH:  Decreased activation of transverse abdominus muscles; abdominals 4-/5; decreased activation of lumbar multifidi; trunk extensors 4-/5   LOWER EXTREMITY ROM:   WFLs  LOWER EXTREMITY MMT:  Hip abductors and extensors 4/5 bil  LUMBAR SPECIAL TESTS:  Negative slump test  FUNCTIONAL TESTS:  30 sec STS hands on knees 8x  GAIT:  3 MWT: no device 644 feet, no pain  TREATMENT DATE: 06/12/24 UBE 5 min while discussing response and current status Pt requested after warm up to exercise in the treatment room Seated stretches: HS, hip flexor stretch with overhead reach, UE elevation 10x  Seated 4# plyo ball:  hip to hip, chops 10x, ear to ear Sit to stand with 5# overhead press 2 sets  of 5 Counter push ups 10x Standing heavy purple power cord trunk extension 10 sec hold 7x Standing heavy purple power cord 10 sec hold 7x Standing green band anchored over the top of the door: bil shoulder extension 10x Standing green band stir the pot 10x  Pallof style green band: hold at 90 degrees with march and retro step 5x each 2 rounds Standing medium pink power cord: side step 10x each direction Dead lifts pair of 8# dumbbells to knee level 10x with cues to keep weight close to the body and forward gaze 1 lap around the gym farmer's carry pair of 8# weights    TREATMENT DATE: 05/29/24 UBE 4 min ( 2 min each direction) while discussing status At the stairs 2nd step hip flexor, gastroc and quadratus lumborum muscle lengthening 2 sets of 5 right/left: Rocking forward and back with arm elevation Rocking forward and back with arm reach  up and over At the stairs 2nd step hamstring stretch 5x right/left (Added to HEP- see below) 6 inch step ups holding single 5# dumbbell 10x right/left  Seated red power cord anchored under feet, pt cued to sit tall 10x Farmer's carry around gym pair of 8# 60 feet 2 laps (Added to HEP- see below) Farmer's hold 8# march 10x Pair of 8# dead lift to knee level 10x (Added to HEP- see below) Chair sit ups single 8# with overhead press 10x (challenging) Seated thoracic extension with ball hands behind neck 10x  Seated red band shoulder horizontal abduction 2x10 (Added to HEP- see below)                                                                                                           PATIENT EDUCATION:  Education details: Educated patient on anatomy and physiology of current symptoms, prognosis, plan of care as well as initial self care strategies to promote recovery Person educated: Patient Education method: Explanation Education comprehension: verbalized understanding  HOME EXERCISE PROGRAM: Access Code: [email or web address] URL:  [email or web address] Date: 05/29/2024 Prepared by: Glade Pesa  Exercises - Standing Hamstring Stretch with Step  - 1 x daily - 7 x weekly - 1 sets - 3 reps - 30 hold - Resisted Sit-to-Stand With Dumbbell at Chest  - 1 x daily - 7 x weekly - 1 sets - 10 reps - Farmer's Carry with Kettlebells  - 1 x daily - 7 x weekly - 1 sets - 10 reps - Half Deadlift with Kettlebell  - 1 x daily - 7 x weekly - 1 sets - 10 reps - Seated Shoulder Horizontal Abduction with Resistance  - 1 x daily - 7 x weekly - 1 sets - 10 reps  ASSESSMENT:  CLINICAL IMPRESSION: Treatment focus on spinal muscle conditioning and strength needed for prolonged walking, bending, lifting and performing household chores like sweeping.  Decreased number of verbal cues and demonstration needed for hip hinge technique.  Her pain level remains very low throughout session.  Therapist also monitoring fatigue level.  Progressing with rehab goals.   OBJECTIVE IMPAIRMENTS: decreased activity tolerance, decreased strength, impaired perceived functional ability, and pain.   ACTIVITY LIMITATIONS: carrying, lifting, bending, sleeping, and locomotion level  PARTICIPATION LIMITATIONS: meal prep, cleaning, laundry, and yard work  PERSONAL FACTORS: Age, Time since onset of injury/illness/exacerbation, and 1-2 comorbidities: diabetes, HTN are also affecting patient's functional outcome.   REHAB POTENTIAL: Good  CLINICAL DECISION MAKING: Stable/uncomplicated  EVALUATION COMPLEXITY: Low   GOALS: Goals reviewed with patient? Yes  SHORT TERM GOALS: Target date: 06/12/2024    The patient will demonstrate knowledge of basic self care strategies and exercises to promote healing  Baseline: Goal status:  met 9/23  2.  Improved LE strength with 11 sit to stands in 30 sec Baseline:  Goal status: ongoing  3.  Patient will be able to lift/carry a 5# object needed for housework and gardening Baseline:  Goal status: met  9/23  4.  PSFS rating with housework: cooking, vacumning, sweeping  4/10 Baseline:  Goal status: ongoing    LONG TERM GOALS: Target date: 07/10/2024   The patient will be independent in a safe self progression of a home exercise program to promote further recovery of function  Baseline:  Goal status: INITIAL  2.  Improved LE strength to 4+/5 needed for stooping to the ground or floor for gardening and housework Baseline:  Goal status: INITIAL  3.  The patient will have improved trunk flexor and extensor muscle strength to at least 4+/5 needed for lifting medium weight objects including emptying the water bucket   Baseline:  Goal status: INITIAL  4.  PSFS rating with housework: cooking, vacumning, sweeping  6/10 Baseline:  Goal status: INITIAL  5.  Able to walk 20 minutes 5x/week for spinal muscle conditioning and stamina Baseline:  Goal status: INITIAL   PLAN:  PT FREQUENCY: 2x/week  PT DURATION: 8 weeks  PLANNED INTERVENTIONS: 97164- PT Re-evaluation, 97110-Therapeutic exercises, 97530- Therapeutic activity, 97112- Neuromuscular re-education, 97535- Self Care, 02859- Manual therapy, [id number]- Aquatic Therapy, H9716- Electrical stimulation (unattended), [phone]- Electrical stimulation (manual), [id number]- Ultrasound, 79439 (1-2 muscles), 20561 (3+ muscles)- Dry Needling, Patient/Family education, Taping, Joint mobilization, Spinal mobilization, Cryotherapy, and Moist heat.  PLAN FOR NEXT SESSION:  check remaining STGs including 5x STS and PSFS with housework; pt requests after warm up to be treated in a treatment room;   landmine press for simulation of vacuuming; sit to stand, step ups, farmer's carries, lifting; spinal muscle strengthening and endurance  Glade Pesa, PT 06/12/24 9:27 PM Phone: [phone] Fax: [phone]

## 2024-06-14 ENCOUNTER — Ambulatory Visit: Admitting: Physical Therapy

## 2024-06-14 DIAGNOSIS — M6281 Muscle weakness (generalized): Secondary | ICD-10-CM

## 2024-06-14 DIAGNOSIS — M546 Pain in thoracic spine: Secondary | ICD-10-CM | POA: Diagnosis not present

## 2024-06-14 DIAGNOSIS — M5459 Other low back pain: Secondary | ICD-10-CM

## 2024-06-14 NOTE — Therapy (Signed)
 OUTPATIENT PHYSICAL THERAPY THORACOLUMBAR PROGRESS NOTE      Patient Name: Heidi Davis MRN: [id number] DOB:05-Jan-1961, 63 y.o., female Today's Date: 06/14/2024  END OF SESSION:  PT End of Session - 06/14/24 1020     Visit Number 6    Date for Recertification  07/10/24    Authorization Type [place] [employer] no auth required    PT Start Time 1016    PT Stop Time 1057    PT Time Calculation (min) 41 min    Activity Tolerance Patient tolerated treatment well          Past Medical History:  Diagnosis Date   Arthritis    Asthma    long times ago- 12 yrs ago. no meds   Diabetes mellitus    GERD (gastroesophageal reflux disease)    Hypertension    Past Surgical History:  Procedure Laterality Date   ABDOMINAL HYSTERECTOMY     total   COLONOSCOPY WITH PROPOFOL  N/A 11/06/2015   Procedure: COLONOSCOPY WITH PROPOFOL ;  Surgeon: Lamar Bunk, MD;  Location: [facility];  Service: Endoscopy;  Laterality: N/A;   Patient Active Problem List   Diagnosis Date Noted   Hyperlipidemia associated with type 2 diabetes mellitus (HCC) 02/10/2023   Local edema 02/10/2023   Irritable bowel syndrome with both constipation and diarrhea 11/14/2022   Chronic midline low back pain without sciatica 11/14/2022   Paresthesia 11/14/2022   Type 2 diabetes mellitus without complication, without long-term current use of insulin (HCC) 11/12/2022   HTN (hypertension) 10/20/2013   Morbid obesity (HCC) 10/20/2013    PCP: Wendolyn Jenkins Jansky MD  REFERRING PROVIDER: Leonce Katz MD  REFERRING DIAG: M54.50, G89.29 chronic midline low back pain; M47.816 facet arthritis of lumbar region; M54.6, G89.29 chronic bil thoracic back pain  Rationale for Evaluation and Treatment: Rehabilitation  THERAPY DIAG:  Pain in thoracic spine  Other low back pain  Muscle weakness  ONSET DATE: > 6 months  SUBJECTIVE:                                                                                                                                                                                            SUBJECTIVE STATEMENT:  Pt reports a little back pain.  The patient's dtr reports she did have the injection and it would take 2 weeks to take effect.  She states her mother likes coming to PT and feels it's helping.   Eval: Pt speaks Arabic and is here with her daughter [name] who translates.  PERTINENT HISTORY:  DM TII; HTN  PAIN:   Are you having pain? Yes NPRS scale: unable  to give a number/language barrier a little bit/10 Pain location:  mid and upper back Pain orientation: Bilateral  PAIN TYPE: aching Pain description: intermittent  Aggravating factors: can't think of anything; raising hand when it's really painful; sometimes wakes up at night time 1-2x/week; pain will be 2-3 days straight Relieving factors: topical rubs; cat cow   PRECAUTIONS: None  WEIGHT BEARING RESTRICTIONS: No  FALLS:  Has patient fallen in last 6 months? No LIVING ENVIRONMENT: Lives with: lives with their family Lives in: House/apartment Stairs: Flight of 8 steps Has following equipment at home: None   OCCUPATION: Retired   PLOF: Independent   PATIENT GOALS: relief overall to continue being more active  The Patient-Specific Functional Scale  Initial:  I am going to ask you to identify up to 3 important activities that you are unable to do or are having difficulty with as a result of this problem.  Today are there any activities that you are unable to do or having difficulty with because of this?  (Patient shown scale and patient rated each activity)  Follow up: When you first came in you had difficulty performing these activities.  Today do you still have difficulty?  Patient-Specific activity scoring scheme (Point to one number):  0 1 2 3 4 5 6 7 8 9  10 Unable                                                                                                          Able to  perform To perform                                                                                                    activity at the same Activity         Level as before                                                                                                                       Injury or problem  Activity      Pick up anything heavy  Initial:       0             9/25: 5-6       2.            Bending over                                                                      Initial:      0                9/25: 5-6  3.            Housework: vacumning, sweeping, cooking                   Initial:       3              9/25:  6    OBJECTIVE:  Note: Objective measures were completed at Evaluation unless otherwise noted.  DIAGNOSTIC FINDINGS:  02/16/23 lumbar MRI IMPRESSION: Lower lumbar facet arthropathy (greatest and severe on the right at L5-S1). Mild bilateral foraminal stenosis at L4-L5. No significant canal stenosis.  COGNITION: Overall cognitive status: Within functional limits for tasks assessed      POSTURE: decreased lumbar lordosis   LUMBAR ROM: able to stoop to pick up a small object off the floor,  quadruped cat/cow with report of shoulder pain  AROM eval   Flexion WFLs   Extension 25% limited   Right lateral flexion WFLs   Left lateral flexion WFLs   Right rotation    Left rotation     (Blank rows = not tested)  TRUNK STRENGTH:  Decreased activation of transverse abdominus muscles; abdominals 4-/5; decreased activation of lumbar multifidi; trunk extensors 4-/5   LOWER EXTREMITY ROM:   WFLs  LOWER EXTREMITY MMT:  Hip abductors and extensors 4/5 bil  LUMBAR SPECIAL TESTS:  Negative slump test  FUNCTIONAL TESTS:  30 sec STS hands on knees 8x 9/25: 30 sec STS 9x   GAIT:  3 MWT: no device 644 feet, no pain  TREATMENT DATE: 06/14/24 UBE 5 min while discussing response and current status Pt  requested after warm up to exercise in the treatment room Seated 5# weight:  V chops 10x Seated 5# on knee hip flexion and abduction over the cane 10x right/left Standing landmine press with red power cord on cane anchored under the foot 15x right/left Standing push press 5# 8x right/left Sit to stand with 8# overhead press 2 sets of 5 Standing rows blue band 10x; staggered stance 10x each way  Standing blue band PNF diagonal extensions anchored over the top of the door 15x right/left  Dead lifts 15# kettlebell  to ankle level 10x with cues to keep weight close to the body and forward gaze  2 laps 100 feet farmer's carry pair of 8# weights (increase weight to 10# next time)   TREATMENT DATE: 06/06/24 UBE 5 min while discussing response and current status Pt requested after warm up to exercise in the treatment room Seated stretches: HS, hip flexor stretch with overhead reach, UE elevation 10x  Seated 4# plyo ball:  hip to hip, chops 10x, ear to ear Sit to stand with 5# overhead press  2 sets of 5 Counter push ups 10x Standing heavy purple power cord trunk extension 10 sec hold 7x Standing heavy purple power cord 10 sec hold 7x Standing green band anchored over the top of the door: bil shoulder extension 10x Standing green band stir the pot 10x  Pallof style green band: hold at 90 degrees with march and retro step 5x each 2 rounds Standing medium pink power cord: side step 10x each direction Dead lifts pair of 8# dumbbells to knee level 10x with cues to keep weight close to the body and forward gaze 1 lap around the gym farmer's carry pair of 8# weights     TREATMENT DATE: 05/29/24 UBE 4 min ( 2 min each direction) while discussing status At the stairs 2nd step hip flexor, gastroc and quadratus lumborum muscle lengthening 2 sets of 5 right/left: Rocking forward and back with arm elevation Rocking forward and back with arm reach up and over At the stairs 2nd step hamstring stretch 5x  right/left (Added to HEP- see below) 6 inch step ups holding single 5# dumbbell 10x right/left  Seated red power cord anchored under feet, pt cued to sit tall 10x Farmer's carry around gym pair of 8# 60 feet 2 laps (Added to HEP- see below) Farmer's hold 8# march 10x Pair of 8# dead lift to knee level 10x (Added to HEP- see below) Chair sit ups single 8# with overhead press 10x (challenging) Seated thoracic extension with ball hands behind neck 10x  Seated red band shoulder horizontal abduction 2x10 (Added to HEP- see below)    TREATMENT DATE: 05/22/24 Nu-Step L5 4 minutes seat 9 while discussing status Standing with foam roll vertically on wall: cervical retractions, cervical rotations, bil UE elevation 10x Standing with foam roll horizontally on wall thoracic extension 10x Standing at the wall open books 10x right/left Seated ball lumbo-thoracic stretch 3 ways Standing modified dead lifts to tall cone holding 8 pound weight 10x with verbal cues for forward gaze for neutral spine while hip hinging   Standing core strengthening series holding pair of 8 pound dumbbells while marching sets of 10 reps each:  1) Farmers hold; 2) single at the shoulder hold Farmer's carry down the hall 50 feet x2 carrying pair of 8 pound weights Sit to stand holding 8 pound weight 10x  Sit to stand holding 8 pound weight 2 sets of 5 Seated core series with 8 pound weight: hip to hip, hip to shoulder, ear to ear 10x each   PATIENT EDUCATION:  Education details: Educated patient on anatomy and physiology of current symptoms, prognosis, plan of care as well as initial self care strategies to promote recovery Person educated: Patient Education method: Explanation Education comprehension: verbalized understanding  HOME EXERCISE PROGRAM: Access Code: [email or web address] URL: [email or web address] Date: 05/29/2024 Prepared by: Glade Pesa  Exercises - Standing Hamstring Stretch with Step  - 1 x daily  - 7 x weekly - 1 sets - 3 reps - 30 hold - Resisted Sit-to-Stand With Dumbbell at Chest  - 1 x daily - 7 x weekly - 1 sets - 10 reps - Farmer's Carry with Kettlebells  - 1 x daily - 7 x weekly - 1 sets - 10 reps - Half Deadlift with Kettlebell  - 1 x daily - 7 x weekly - 1 sets - 10 reps - Seated Shoulder Horizontal Abduction with Resistance  - 1 x daily - 7 x weekly - 1 sets - 10 reps  ASSESSMENT:  CLINICAL IMPRESSION: Much  improved PSFS score since start of care indicating improved function with less pain.  Improved number of repetitions with 30 sec STS test.  Her back pain is reported as a little bit since her recent spinal injection.  Therapist providing verbal and demonstration of ex's for proper technique and monitoring response to treatment.   OBJECTIVE IMPAIRMENTS: decreased activity tolerance, decreased strength, impaired perceived functional ability, and pain.   ACTIVITY LIMITATIONS: carrying, lifting, bending, sleeping, and locomotion level  PARTICIPATION LIMITATIONS: meal prep, cleaning, laundry, and yard work  PERSONAL FACTORS: Age, Time since onset of injury/illness/exacerbation, and 1-2 comorbidities: diabetes, HTN are also affecting patient's functional outcome.   REHAB POTENTIAL: Good  CLINICAL DECISION MAKING: Stable/uncomplicated  EVALUATION COMPLEXITY: Low   GOALS: Goals reviewed with patient? Yes  SHORT TERM GOALS: Target date: 06/12/2024    The patient will demonstrate knowledge of basic self care strategies and exercises to promote healing  Baseline: Goal status:  met 9/23  2.  Improved LE strength with 11 sit to stands in 30 sec Baseline:  Goal status: ongoing  3.  Patient will be able to lift/carry a 5# object needed for housework and gardening Baseline:  Goal status: met 9/23  4.  PSFS rating with housework: cooking, vacumning, sweeping  4/10 Baseline:  Goal status: met 9/25    LONG TERM GOALS: Target date: 07/10/2024   The patient will be  independent in a safe self progression of a home exercise program to promote further recovery of function  Baseline:  Goal status: INITIAL  2.  Improved LE strength to 4+/5 needed for stooping to the ground or floor for gardening and housework Baseline:  Goal status: INITIAL  3.  The patient will have improved trunk flexor and extensor muscle strength to at least 4+/5 needed for lifting medium weight objects including emptying the water bucket   Baseline:  Goal status: INITIAL  4.  PSFS rating with housework: cooking, vacumning, sweeping  6/10 Baseline:  Goal status: INITIAL  5.  Able to walk 20 minutes 5x/week for spinal muscle conditioning and stamina Baseline:  Goal status: INITIAL   PLAN:  PT FREQUENCY: 2x/week  PT DURATION: 8 weeks  PLANNED INTERVENTIONS: [id number]- PT Re-evaluation, 97110-Therapeutic exercises, 97530- Therapeutic activity, 97112- Neuromuscular re-education, 97535- Self Care, [id number]- Manual therapy, [id number]- Aquatic Therapy, [id number]- Electrical stimulation (unattended), [phone]- Electrical stimulation (manual), [id number]- Ultrasound, 79439 (1-2 muscles), 20561 (3+ muscles)- Dry Needling, Patient/Family education, Taping, Joint mobilization, Spinal mobilization, Cryotherapy, and Moist heat.  PLAN FOR NEXT SESSION:  pt requests after warm up to be treated in a treatment room;   landmine press for simulation of vacuuming; sit to stand, step ups, farmer's carries, lifting; spinal muscle strengthening and endurance  Glade Pesa, PT 06/14/24 10:38 PM Phone: [phone] Fax: [phone]

## 2024-06-19 ENCOUNTER — Ambulatory Visit: Admitting: Physical Therapy

## 2024-06-19 DIAGNOSIS — M6281 Muscle weakness (generalized): Secondary | ICD-10-CM

## 2024-06-19 DIAGNOSIS — M5459 Other low back pain: Secondary | ICD-10-CM

## 2024-06-19 DIAGNOSIS — M546 Pain in thoracic spine: Secondary | ICD-10-CM

## 2024-06-19 NOTE — Therapy (Signed)
 OUTPATIENT PHYSICAL THERAPY THORACOLUMBAR PROGRESS NOTE      Patient Name: Heidi Davis MRN: [id number] DOB:[birth year], 63 y.o., female Today's Date: 06/19/2024  END OF SESSION:  PT End of Session - 06/19/24 1015     Visit Number 7    Date for Recertification  07/10/24    Authorization Type [place] Medicaid [employer] no auth required    PT Start Time 1015    PT Stop Time 1056    PT Time Calculation (min) 41 min    Activity Tolerance Patient tolerated treatment well          Past Medical History:  Diagnosis Date   Arthritis    Asthma    long times ago- 12 yrs ago. no meds   Diabetes mellitus    GERD (gastroesophageal reflux disease)    Hypertension    Past Surgical History:  Procedure Laterality Date   ABDOMINAL HYSTERECTOMY     total   COLONOSCOPY WITH PROPOFOL  N/A 11/06/2015   Procedure: COLONOSCOPY WITH PROPOFOL ;  Surgeon: Lamar Bunk, MD;  Location: [facility];  Service: Endoscopy;  Laterality: N/A;   Patient Active Problem List   Diagnosis Date Noted   Hyperlipidemia associated with type 2 diabetes mellitus (HCC) 02/10/2023   Local edema 02/10/2023   Irritable bowel syndrome with both constipation and diarrhea 11/14/2022   Chronic midline low back pain without sciatica 11/14/2022   Paresthesia 11/14/2022   Type 2 diabetes mellitus without complication, without long-term current use of insulin (HCC) 11/12/2022   HTN (hypertension) 10/20/2013   Morbid obesity (HCC) 10/20/2013    PCP: Wendolyn Jenkins Jansky MD  REFERRING PROVIDER: Leonce Katz MD  REFERRING DIAG: M54.50, G89.29 chronic midline low back pain; M47.816 facet arthritis of lumbar region; M54.6, G89.29 chronic bil thoracic back pain  Rationale for Evaluation and Treatment: Rehabilitation  THERAPY DIAG:  Pain in thoracic spine  Other low back pain  Muscle weakness  ONSET DATE: > 6 months  SUBJECTIVE:                                                                                                                                                                                            SUBJECTIVE STATEMENT:  Pt reports mid back pain but also states no change.  Eval: Pt speaks Arabic and is here with her daughter [name] who translates.  PERTINENT HISTORY:  DM TII; HTN  PAIN:   Are you having pain? Yes NPRS scale: unable to give a number/language barrier medium/10 Pain location:  mid and upper back Pain orientation: Bilateral  PAIN TYPE: aching Pain description: intermittent  Aggravating factors: can't think of  anything; raising hand when it's really painful; sometimes wakes up at night time 1-2x/week; pain will be 2-3 days straight Relieving factors: topical rubs; cat cow   PRECAUTIONS: None  WEIGHT BEARING RESTRICTIONS: No  FALLS:  Has patient fallen in last 6 months? No LIVING ENVIRONMENT: Lives with: lives with their family Lives in: House/apartment Stairs: Flight of 8 steps Has following equipment at home: None   OCCUPATION: Retired   PLOF: Independent   PATIENT GOALS: relief overall to continue being more active  The Patient-Specific Functional Scale  Initial:  I am going to ask you to identify up to 3 important activities that you are unable to do or are having difficulty with as a result of this problem.  Today are there any activities that you are unable to do or having difficulty with because of this?  (Patient shown scale and patient rated each activity)  Follow up: When you first came in you had difficulty performing these activities.  Today do you still have difficulty?  Patient-Specific activity scoring scheme (Point to one number):  0 1 2 3 4 5 6 7 8 9  10 Unable                                                                                                          Able to perform To perform                                                                                                    activity at the  same Activity         Level as before                                                                                                                       Injury or problem  Activity      Pick up anything heavy  Initial:       0             9/25: 5-6       2.            Bending over                                                                      Initial:      0                9/25: 5-6  3.            Housework: vacumning, sweeping, cooking                   Initial:       3              9/25:  6    OBJECTIVE:  Note: Objective measures were completed at Evaluation unless otherwise noted.  DIAGNOSTIC FINDINGS:  02/16/23 lumbar MRI IMPRESSION: Lower lumbar facet arthropathy (greatest and severe on the right at L5-S1). Mild bilateral foraminal stenosis at L4-L5. No significant canal stenosis.  COGNITION: Overall cognitive status: Within functional limits for tasks assessed      POSTURE: decreased lumbar lordosis   LUMBAR ROM: able to stoop to pick up a small object off the floor,  quadruped cat/cow with report of shoulder pain  AROM eval   Flexion WFLs   Extension 25% limited   Right lateral flexion WFLs   Left lateral flexion WFLs   Right rotation    Left rotation     (Blank rows = not tested)  TRUNK STRENGTH:  Decreased activation of transverse abdominus muscles; abdominals 4-/5; decreased activation of lumbar multifidi; trunk extensors 4-/5   LOWER EXTREMITY ROM:   WFLs  LOWER EXTREMITY MMT:  Hip abductors and extensors 4/5 bil  LUMBAR SPECIAL TESTS:  Negative slump test  FUNCTIONAL TESTS:  30 sec STS hands on knees 8x 9/25: 30 sec STS 9x   GAIT:  3 MWT: no device 644 feet, no pain TREATMENT DATE: 06/19/24 UBE 4 min while discussing response and current status Pt requested after warm up to exercise in the treatment room Seated thoracic extension with ball, hands behind head 10x Supine red band  horizontal abduction 10x Supine red band diagonals 10x each way Standing push press 5# 8x right/left Squats with dowel raise 10x  Dead lifts with red power cord on dowel 10x Rows with red power cord on dowel 10x Stir the pot with red power cord on dowel 10x each way Lateral lunge with 5# reach down 10x right/left 5# overhead press 8x right/left Bil row with red power cord 10x Red power cord trunk rotation 10x each way  2 laps 100 feet farmer's carry pair of 9# weights   TREATMENT DATE: 06/14/24 UBE 5 min while discussing response and current status Pt requested after warm up to exercise in the treatment room Seated 5# weight:  V chops 10x Seated 5# on knee hip flexion and abduction over the cane 10x right/left Standing landmine press with red power cord on cane anchored under the foot 15x right/left Standing push press 5# 8x right/left Sit to stand with 8# overhead press 2  sets of 5 Standing rows blue band 10x; staggered stance 10x each way  Standing blue band PNF diagonal extensions anchored over the top of the door 15x right/left  Dead lifts 15# kettlebell  to ankle level 10x with cues to keep weight close to the body and forward gaze  2 laps 100 feet farmer's carry pair of 8# weights (increase weight to 10# next time)   TREATMENT DATE: 06/06/24 UBE 5 min while discussing response and current status Pt requested after warm up to exercise in the treatment room Seated stretches: HS, hip flexor stretch with overhead reach, UE elevation 10x  Seated 4# plyo ball:  hip to hip, chops 10x, ear to ear Sit to stand with 5# overhead press 2 sets of 5 Counter push ups 10x Standing heavy purple power cord trunk extension 10 sec hold 7x Standing heavy purple power cord 10 sec hold 7x Standing green band anchored over the top of the door: bil shoulder extension 10x Standing green band stir the pot 10x  Pallof style green band: hold at 90 degrees with march and retro step 5x each 2  rounds Standing medium pink power cord: side step 10x each direction Dead lifts pair of 8# dumbbells to knee level 10x with cues to keep weight close to the body and forward gaze 1 lap around the gym farmer's carry pair of 8# weights     TREATMENT DATE: 05/29/24 UBE 4 min ( 2 min each direction) while discussing status At the stairs 2nd step hip flexor, gastroc and quadratus lumborum muscle lengthening 2 sets of 5 right/left: Rocking forward and back with arm elevation Rocking forward and back with arm reach up and over At the stairs 2nd step hamstring stretch 5x right/left (Added to HEP- see below) 6 inch step ups holding single 5# dumbbell 10x right/left  Seated red power cord anchored under feet, pt cued to sit tall 10x Farmer's carry around gym pair of 8# 60 feet 2 laps (Added to HEP- see below) Farmer's hold 8# march 10x Pair of 8# dead lift to knee level 10x (Added to HEP- see below) Chair sit ups single 8# with overhead press 10x (challenging) Seated thoracic extension with ball hands behind neck 10x  Seated red band shoulder horizontal abduction 2x10 (Added to HEP- see below)     PATIENT EDUCATION:  Education details: Educated patient on anatomy and physiology of current symptoms, prognosis, plan of care as well as initial self care strategies to promote recovery Person educated: Patient Education method: Explanation Education comprehension: verbalized understanding  HOME EXERCISE PROGRAM: Access Code: [email or web address] URL: [email or web address] Date: 05/29/2024 Prepared by: Glade Pesa  Exercises - Standing Hamstring Stretch with Step  - 1 x daily - 7 x weekly - 1 sets - 3 reps - 30 hold - Resisted Sit-to-Stand With Dumbbell at Chest  - 1 x daily - 7 x weekly - 1 sets - 10 reps - Farmer's Carry with Kettlebells  - 1 x daily - 7 x weekly - 1 sets - 10 reps - Half Deadlift with Kettlebell  - 1 x daily - 7 x weekly - 1 sets - 10 reps - Seated Shoulder Horizontal  Abduction with Resistance  - 1 x daily - 7 x weekly - 1 sets - 10 reps  ASSESSMENT:  CLINICAL IMPRESSION: Therapist closely monitoring response throughout session while also giving verbal and physical demonstrations.  Therapeutic activities to simulate lifting, carrying objects and sweeping.  Verbal cues for forward gaze with hip hinge  and wider stance.  She reports at the end of session that she feels better than when she came in.     OBJECTIVE IMPAIRMENTS: decreased activity tolerance, decreased strength, impaired perceived functional ability, and pain.   ACTIVITY LIMITATIONS: carrying, lifting, bending, sleeping, and locomotion level  PARTICIPATION LIMITATIONS: meal prep, cleaning, laundry, and yard work  PERSONAL FACTORS: Age, Time since onset of injury/illness/exacerbation, and 1-2 comorbidities: diabetes, HTN are also affecting patient's functional outcome.   REHAB POTENTIAL: Good  CLINICAL DECISION MAKING: Stable/uncomplicated  EVALUATION COMPLEXITY: Low   GOALS: Goals reviewed with patient? Yes  SHORT TERM GOALS: Target date: 06/12/2024    The patient will demonstrate knowledge of basic self care strategies and exercises to promote healing  Baseline: Goal status:  met 9/23  2.  Improved LE strength with 11 sit to stands in 30 sec Baseline:  Goal status: ongoing  3.  Patient will be able to lift/carry a 5# object needed for housework and gardening Baseline:  Goal status: met 9/23  4.  PSFS rating with housework: cooking, vacumning, sweeping  4/10 Baseline:  Goal status: met 9/25    LONG TERM GOALS: Target date: 07/10/2024   The patient will be independent in a safe self progression of a home exercise program to promote further recovery of function  Baseline:  Goal status: INITIAL  2.  Improved LE strength to 4+/5 needed for stooping to the ground or floor for gardening and housework Baseline:  Goal status: INITIAL  3.  The patient will have improved  trunk flexor and extensor muscle strength to at least 4+/5 needed for lifting medium weight objects including emptying the water bucket   Baseline:  Goal status: INITIAL  4.  PSFS rating with housework: cooking, vacumning, sweeping  6/10 Baseline:  Goal status: INITIAL  5.  Able to walk 20 minutes 5x/week for spinal muscle conditioning and stamina Baseline:  Goal status: INITIAL   PLAN:  PT FREQUENCY: 2x/week  PT DURATION: 8 weeks  PLANNED INTERVENTIONS: [id number]- PT Re-evaluation, 97110-Therapeutic exercises, 97530- Therapeutic activity, 97112- Neuromuscular re-education, 97535- Self Care, [id number]- Manual therapy, [id number]- Aquatic Therapy, [id number]- Electrical stimulation (unattended), [phone]- Electrical stimulation (manual), [id number]- Ultrasound, [id number] (1-2 muscles), [id number] (3+ muscles)- Dry Needling, Patient/Family education, Taping, Joint mobilization, Spinal mobilization, Cryotherapy, and Moist heat.  PLAN FOR NEXT SESSION:  pt requests after warm up to be treated in a treatment room;   landmine press for simulation of vacuuming; sit to stand, step ups, farmer's carries, lifting; spinal muscle strengthening and endurance  Glade Pesa, PT 06/19/24 9:13 PM Phone: [phone] Fax: [phone]

## 2024-06-26 ENCOUNTER — Ambulatory Visit: Attending: Sports Medicine | Admitting: Physical Therapy

## 2024-06-26 DIAGNOSIS — M5459 Other low back pain: Secondary | ICD-10-CM | POA: Insufficient documentation

## 2024-06-26 DIAGNOSIS — M6281 Muscle weakness (generalized): Secondary | ICD-10-CM | POA: Diagnosis present

## 2024-06-26 DIAGNOSIS — M546 Pain in thoracic spine: Secondary | ICD-10-CM | POA: Diagnosis present

## 2024-06-26 DIAGNOSIS — R293 Abnormal posture: Secondary | ICD-10-CM | POA: Diagnosis present

## 2024-06-26 NOTE — Therapy (Signed)
 OUTPATIENT PHYSICAL THERAPY THORACOLUMBAR PROGRESS NOTE      Patient Name: Heidi Davis MRN: [id number] DOB:1961/01/03, 63 y.o., female Today's Date: 06/26/2024  END OF SESSION:  PT End of Session - 06/26/24 1105     Visit Number 8    Date for Recertification  07/10/24    Authorization Type [place] [employer] no auth required    PT Start Time 1102    PT Stop Time 1143    PT Time Calculation (min) 41 min    Activity Tolerance Patient tolerated treatment well          Past Medical History:  Diagnosis Date   Arthritis    Asthma    long times ago- 12 yrs ago. no meds   Diabetes mellitus    GERD (gastroesophageal reflux disease)    Hypertension    Past Surgical History:  Procedure Laterality Date   ABDOMINAL HYSTERECTOMY     total   COLONOSCOPY WITH PROPOFOL  N/A 11/06/2015   Procedure: COLONOSCOPY WITH PROPOFOL ;  Surgeon: Lamar Bunk, MD;  Location: [facility];  Service: Endoscopy;  Laterality: N/A;   Patient Active Problem List   Diagnosis Date Noted   Hyperlipidemia associated with type 2 diabetes mellitus (HCC) 02/10/2023   Local edema 02/10/2023   Irritable bowel syndrome with both constipation and diarrhea 11/14/2022   Chronic midline low back pain without sciatica 11/14/2022   Paresthesia 11/14/2022   Type 2 diabetes mellitus without complication, without long-term current use of insulin (HCC) 11/12/2022   HTN (hypertension) 10/20/2013   Morbid obesity (HCC) 10/20/2013    PCP: Wendolyn Jenkins Jansky MD  REFERRING PROVIDER: Leonce Katz MD  REFERRING DIAG: M54.50, G89.29 chronic midline low back pain; M47.816 facet arthritis of lumbar region; M54.6, G89.29 chronic bil thoracic back pain  Rationale for Evaluation and Treatment: Rehabilitation  THERAPY DIAG:  Pain in thoracic spine  Other low back pain  Muscle weakness  ONSET DATE: > 6 months  SUBJECTIVE:                                                                                                                                                                                            SUBJECTIVE STATEMENT:  Reports a little bit of pain in the mornings.   Pt speaks Arabic and is here with her daughter [name] who translates.  PERTINENT HISTORY:  DM TII; HTN  PAIN:   Are you having pain? Yes NPRS scale: unable to give a number/language barrier a little bit/10 Pain location:  mid and upper back Pain orientation: Bilateral  PAIN TYPE: aching Pain description: intermittent  Aggravating factors: can't think  of anything; raising hand when it's really painful; sometimes wakes up at night time 1-2x/week; pain will be 2-3 days straight Relieving factors: topical rubs; cat cow   PRECAUTIONS: None  WEIGHT BEARING RESTRICTIONS: No  FALLS:  Has patient fallen in last 6 months? No LIVING ENVIRONMENT: Lives with: lives with their family Lives in: House/apartment Stairs: Flight of 8 steps Has following equipment at home: None   OCCUPATION: Retired   PLOF: Independent   PATIENT GOALS: relief overall to continue being more active  The Patient-Specific Functional Scale  Initial:  I am going to ask you to identify up to 3 important activities that you are unable to do or are having difficulty with as a result of this problem.  Today are there any activities that you are unable to do or having difficulty with because of this?  (Patient shown scale and patient rated each activity)  Follow up: When you first came in you had difficulty performing these activities.  Today do you still have difficulty?  Patient-Specific activity scoring scheme (Point to one number):  0 1 2 3 4 5 6 7 8 9  10 Unable                                                                                                          Able to perform To perform                                                                                                    activity at the same Activity         Level  as before                                                                                                                       Injury or problem  Activity      Pick up anything heavy  Initial:       0             9/25: 5-6       2.            Bending over                                                                      Initial:      0                9/25: 5-6  3.            Housework: vacumning, sweeping, cooking                   Initial:       3              9/25:  6    OBJECTIVE:  Note: Objective measures were completed at Evaluation unless otherwise noted.  DIAGNOSTIC FINDINGS:  02/16/23 lumbar MRI IMPRESSION: Lower lumbar facet arthropathy (greatest and severe on the right at L5-S1). Mild bilateral foraminal stenosis at L4-L5. No significant canal stenosis.  COGNITION: Overall cognitive status: Within functional limits for tasks assessed      POSTURE: decreased lumbar lordosis   LUMBAR ROM: able to stoop to pick up a small object off the floor,  quadruped cat/cow with report of shoulder pain  AROM eval 10/7  Flexion WFLs WFLs  Extension 25% limited WFLs  Right lateral flexion WFLs WFLs  Left lateral flexion WFLs WFLs  Right rotation    Left rotation     (Blank rows = not tested)  TRUNK STRENGTH:  Decreased activation of transverse abdominus muscles; abdominals 4-/5; decreased activation of lumbar multifidi; trunk extensors 4-/5   LOWER EXTREMITY ROM:   WFLs  LOWER EXTREMITY MMT:  Hip abductors and extensors 4/5 bil  LUMBAR SPECIAL TESTS:  Negative slump test  FUNCTIONAL TESTS:  30 sec STS hands on knees 8x 9/25: 30 sec STS 9x   GAIT:  3 MWT: no device 644 feet, no pain  TREATMENT DATE: 06/26/24 UBE 4 min while discussing response and current status Pt requested after warm up to exercise in the treatment room Standing thoracic extension with foam roll on wall, hands behind head 10x Standing foam  roll on wall vertically with UE elevation 10x Seated abdominal draw in with push down on foam roll 10x  Sit ups with 5# overhead press 10x Sit to stand holding 5# weight 10x Mini lunges 3 ways holding pair of 5# weights: backward, lateral, forward 5x each side/each leg Dead lifts 15# Kettlebell form 8 inch stool 10x (try 20# only 5 reps next time) Green band with handles rows 20x and push outs 10x each Green band PNF diagonal extensions  10x each way  2 laps 100 feet farmer's carry pair of 10# weights   TREATMENT DATE: 06/19/24 UBE 4 min while discussing response and current status Pt requested after warm up to exercise in the treatment room Seated thoracic extension with ball, hands behind head 10x Supine red band horizontal abduction 10x Supine red band diagonals 10x each way Standing push press 5# 8x right/left Squats with dowel raise 10x  Dead lifts with red power  cord on dowel 10x Rows with red power cord on dowel 10x Stir the pot with red power cord on dowel 10x each way Lateral lunge with 5# reach down 10x right/left 5# overhead press 8x right/left Bil row with red power cord 10x Red power cord trunk rotation 10x each way  2 laps 100 feet farmer's carry pair of 9# weights   TREATMENT DATE: 06/14/24 UBE 5 min while discussing response and current status Pt requested after warm up to exercise in the treatment room Seated 5# weight:  V chops 10x Seated 5# on knee hip flexion and abduction over the cane 10x right/left Standing landmine press with red power cord on cane anchored under the foot 15x right/left Standing push press 5# 8x right/left Sit to stand with 8# overhead press 2 sets of 5 Standing rows blue band 10x; staggered stance 10x each way  Standing blue band PNF diagonal extensions anchored over the top of the door 15x right/left  Dead lifts 15# kettlebell  to ankle level 10x with cues to keep weight close to the body and forward gaze  2 laps 100 feet farmer's carry  pair of 8# weights (increase weight to 10# next time)   TREATMENT DATE: 06/06/24 UBE 5 min while discussing response and current status Pt requested after warm up to exercise in the treatment room Seated stretches: HS, hip flexor stretch with overhead reach, UE elevation 10x  Seated 4# plyo ball:  hip to hip, chops 10x, ear to ear Sit to stand with 5# overhead press 2 sets of 5 Counter push ups 10x Standing heavy purple power cord trunk extension 10 sec hold 7x Standing heavy purple power cord 10 sec hold 7x Standing green band anchored over the top of the door: bil shoulder extension 10x Standing green band stir the pot 10x  Pallof style green band: hold at 90 degrees with march and retro step 5x each 2 rounds Standing medium pink power cord: side step 10x each direction Dead lifts pair of 8# dumbbells to knee level 10x with cues to keep weight close to the body and forward gaze 1 lap around the gym farmer's carry pair of 8# weights     TREATMENT DATE: 05/29/24 UBE 4 min ( 2 min each direction) while discussing status At the stairs 2nd step hip flexor, gastroc and quadratus lumborum muscle lengthening 2 sets of 5 right/left: Rocking forward and back with arm elevation Rocking forward and back with arm reach up and over At the stairs 2nd step hamstring stretch 5x right/left (Added to HEP- see below) 6 inch step ups holding single 5# dumbbell 10x right/left  Seated red power cord anchored under feet, pt cued to sit tall 10x Farmer's carry around gym pair of 8# 60 feet 2 laps (Added to HEP- see below) Farmer's hold 8# march 10x Pair of 8# dead lift to knee level 10x (Added to HEP- see below) Chair sit ups single 8# with overhead press 10x (challenging) Seated thoracic extension with ball hands behind neck 10x  Seated red band shoulder horizontal abduction 2x10 (Added to HEP- see below)     PATIENT EDUCATION:  Education details: Educated patient on anatomy and physiology of current  symptoms, prognosis, plan of care as well as initial self care strategies to promote recovery Person educated: Patient Education method: Explanation Education comprehension: verbalized understanding  HOME EXERCISE PROGRAM: Access Code: [email or web address] URL: [email or web address] Date: 05/29/2024 Prepared by: Glade Pesa  Exercises - Standing Hamstring Stretch with Step  -  1 x daily - 7 x weekly - 1 sets - 3 reps - 30 hold - Resisted Sit-to-Stand With Dumbbell at Chest  - 1 x daily - 7 x weekly - 1 sets - 10 reps - Farmer's Carry with Kettlebells  - 1 x daily - 7 x weekly - 1 sets - 10 reps - Half Deadlift with Kettlebell  - 1 x daily - 7 x weekly - 1 sets - 10 reps - Seated Shoulder Horizontal Abduction with Resistance  - 1 x daily - 7 x weekly - 1 sets - 10 reps  ASSESSMENT:  CLINICAL IMPRESSION: Therapist providing demonstration of ex's secondary to language barrier.  Fewer cues needed for hip hinge/dead lift technique.  No complaints of pain during session.  She is able to increase weight with farmer's carry without a reduction in gait speed.  She reports her back pain during and following session is good.   OBJECTIVE IMPAIRMENTS: decreased activity tolerance, decreased strength, impaired perceived functional ability, and pain.   ACTIVITY LIMITATIONS: carrying, lifting, bending, sleeping, and locomotion level  PARTICIPATION LIMITATIONS: meal prep, cleaning, laundry, and yard work  PERSONAL FACTORS: Age, Time since onset of injury/illness/exacerbation, and 1-2 comorbidities: diabetes, HTN are also affecting patient's functional outcome.   REHAB POTENTIAL: Good  CLINICAL DECISION MAKING: Stable/uncomplicated  EVALUATION COMPLEXITY: Low   GOALS: Goals reviewed with patient? Yes  SHORT TERM GOALS: Target date: 06/12/2024    The patient will demonstrate knowledge of basic self care strategies and exercises to promote healing  Baseline: Goal status:  met 9/23  2.   Improved LE strength with 11 sit to stands in 30 sec Baseline:  Goal status: ongoing  3.  Patient will be able to lift/carry a 5# object needed for housework and gardening Baseline:  Goal status: met 9/23  4.  PSFS rating with housework: cooking, vacumning, sweeping  4/10 Baseline:  Goal status: met 9/25    LONG TERM GOALS: Target date: 07/10/2024   The patient will be independent in a safe self progression of a home exercise program to promote further recovery of function  Baseline:  Goal status: INITIAL  2.  Improved LE strength to 4+/5 needed for stooping to the ground or floor for gardening and housework Baseline:  Goal status: INITIAL  3.  The patient will have improved trunk flexor and extensor muscle strength to at least 4+/5 needed for lifting medium weight objects including emptying the water bucket   Baseline:  Goal status: INITIAL  4.  PSFS rating with housework: cooking, vacumning, sweeping  6/10 Baseline:  Goal status: INITIAL  5.  Able to walk 20 minutes 5x/week for spinal muscle conditioning and stamina Baseline:  Goal status: INITIAL   PLAN:  PT FREQUENCY: 2x/week  PT DURATION: 8 weeks  PLANNED INTERVENTIONS: [id number]- PT Re-evaluation, 97110-Therapeutic exercises, 97530- Therapeutic activity, 97112- Neuromuscular re-education, 97535- Self Care, [id number]- Manual therapy, [id number]- Aquatic Therapy, [id number]- Electrical stimulation (unattended), [phone]- Electrical stimulation (manual), [id number]- Ultrasound, 79439 (1-2 muscles), [id number] (3+ muscles)- Dry Needling, Patient/Family education, Taping, Joint mobilization, Spinal mobilization, Cryotherapy, and Moist heat.  PLAN FOR NEXT SESSION:  pt requests after warm up to be treated in a treatment room;   landmine press for simulation of vacuuming; sit to stand, step ups, farmer's carries, lifting; spinal muscle strengthening and endurance  Glade Pesa, PT 06/26/24 8:44 PM Phone: [phone] Fax:  [phone]

## 2024-07-03 ENCOUNTER — Ambulatory Visit: Admitting: Physical Therapy

## 2024-07-03 DIAGNOSIS — M5459 Other low back pain: Secondary | ICD-10-CM

## 2024-07-03 DIAGNOSIS — M546 Pain in thoracic spine: Secondary | ICD-10-CM

## 2024-07-03 DIAGNOSIS — R293 Abnormal posture: Secondary | ICD-10-CM

## 2024-07-03 DIAGNOSIS — M6281 Muscle weakness (generalized): Secondary | ICD-10-CM

## 2024-07-03 NOTE — Therapy (Signed)
 OUTPATIENT PHYSICAL THERAPY THORACOLUMBAR PROGRESS NOTE      Patient Name: Heidi Davis MRN: [id number] DOB:04-07-1961, 63 y.o., female Today's Date: 07/03/2024  END OF SESSION:  PT End of Session - 07/03/24 1022     Visit Number 9    Date for Recertification  07/10/24    Authorization Type [place] [employer] no auth required    PT Start Time 1020    PT Stop Time 1058    PT Time Calculation (min) 38 min    Activity Tolerance Patient tolerated treatment well          Past Medical History:  Diagnosis Date   Arthritis    Asthma    long times ago- 12 yrs ago. no meds   Diabetes mellitus    GERD (gastroesophageal reflux disease)    Hypertension    Past Surgical History:  Procedure Laterality Date   ABDOMINAL HYSTERECTOMY     total   COLONOSCOPY WITH PROPOFOL  N/A 11/06/2015   Procedure: COLONOSCOPY WITH PROPOFOL ;  Surgeon: Lamar Bunk, MD;  Location: [facility];  Service: Endoscopy;  Laterality: N/A;   Patient Active Problem List   Diagnosis Date Noted   Hyperlipidemia associated with type 2 diabetes mellitus (HCC) 02/10/2023   Local edema 02/10/2023   Irritable bowel syndrome with both constipation and diarrhea 11/14/2022   Chronic midline low back pain without sciatica 11/14/2022   Paresthesia 11/14/2022   Type 2 diabetes mellitus without complication, without long-term current use of insulin (HCC) 11/12/2022   HTN (hypertension) 10/20/2013   Morbid obesity (HCC) 10/20/2013    PCP: Wendolyn Jenkins Jansky MD  REFERRING PROVIDER: Leonce Katz MD  REFERRING DIAG: M54.50, G89.29 chronic midline low back pain; M47.816 facet arthritis of lumbar region; M54.6, G89.29 chronic bil thoracic back pain  Rationale for Evaluation and Treatment: Rehabilitation  THERAPY DIAG:  Pain in thoracic spine  Other low back pain  Muscle weakness  Abnormal posture  ONSET DATE: > 6 months  SUBJECTIVE:                                                                                                                                                                                            SUBJECTIVE STATEMENT:  Reports her back is good now.    Pt speaks Arabic and is here with her daughter [name] who translates.  PERTINENT HISTORY:  DM TII; HTN  PAIN:   Are you having pain? Yes NPRS scale: no back pain now 0/10 Pain location:  mid and upper back Pain orientation: Bilateral  PAIN TYPE: aching Pain description: intermittent  Aggravating factors: can't think of anything; raising  hand when it's really painful; sometimes wakes up at night time 1-2x/week; pain will be 2-3 days straight Relieving factors: topical rubs; cat cow   PRECAUTIONS: None  WEIGHT BEARING RESTRICTIONS: No  FALLS:  Has patient fallen in last 6 months? No LIVING ENVIRONMENT: Lives with: lives with their family Lives in: House/apartment Stairs: Flight of 8 steps Has following equipment at home: None   OCCUPATION: Retired   PLOF: Independent   PATIENT GOALS: relief overall to continue being more active  The Patient-Specific Functional Scale  Initial:  I am going to ask you to identify up to 3 important activities that you are unable to do or are having difficulty with as a result of this problem.  Today are there any activities that you are unable to do or having difficulty with because of this?  (Patient shown scale and patient rated each activity)  Follow up: When you first came in you had difficulty performing these activities.  Today do you still have difficulty?  Patient-Specific activity scoring scheme (Point to one number):  0 1 2 3 4 5 6 7 8 9  10 Unable                                                                                                          Able to perform To perform                                                                                                    activity at the same Activity         Level as before                                                                                                                        Injury or problem  Activity      Pick up anything heavy  Initial:       0             9/25: 5-6       2.            Bending over                                                                      Initial:      0                9/25: 5-6  3.            Housework: vacumning, sweeping, cooking                   Initial:       3              9/25:  6    OBJECTIVE:  Note: Objective measures were completed at Evaluation unless otherwise noted.  DIAGNOSTIC FINDINGS:  02/16/23 lumbar MRI IMPRESSION: Lower lumbar facet arthropathy (greatest and severe on the right at L5-S1). Mild bilateral foraminal stenosis at L4-L5. No significant canal stenosis.  COGNITION: Overall cognitive status: Within functional limits for tasks assessed      POSTURE: decreased lumbar lordosis   LUMBAR ROM: able to stoop to pick up a small object off the floor,  quadruped cat/cow with report of shoulder pain  AROM eval 10/7  Flexion WFLs WFLs  Extension 25% limited WFLs  Right lateral flexion WFLs WFLs  Left lateral flexion WFLs WFLs  Right rotation    Left rotation     (Blank rows = not tested)  TRUNK STRENGTH:  Decreased activation of transverse abdominus muscles; abdominals 4-/5; decreased activation of lumbar multifidi; trunk extensors 4-/5   LOWER EXTREMITY ROM:   WFLs  LOWER EXTREMITY MMT:  Hip abductors and extensors 4/5 bil  LUMBAR SPECIAL TESTS:  Negative slump test  FUNCTIONAL TESTS:  30 sec STS hands on knees 8x 9/25: 30 sec STS 9x   GAIT:  3 MWT: no device 644 feet, no pain  TREATMENT DATE: 07/03/24 UBE 4 min while discussing response and current status Pt requested after warm up to exercise in the treatment room Seated thoracic extension with ball  hands behind head 10x Seated open books (diagonal)  5x right/left  Seated UE  elevation with coordinated breathing 10x Sit to stand with medium pink power cord anchored under feet 10x Standing blue band anchored on door rows 10x, bil shoulder extension 10x Reverse lunge at the sink 5x right/left Resisted walk medium power cord 4 ways 8x each: forward, backward, sides Dead lifts 20# from 8 inch stool 7x pt states it's good 5# bent rows 5x right/left 5# overhead press 5x right/left  2 laps 100 feet farmer's carry pair of 10# weights    TREATMENT DATE: 06/26/24 UBE 4 min while discussing response and current status Pt requested after warm up to exercise in the treatment room Standing thoracic extension with foam roll on wall, hands behind head 10x Standing foam roll on wall vertically with UE elevation 10x Seated abdominal draw in with push down on foam roll 10x  Sit ups with 5# overhead press 10x Sit to stand holding 5# weight 10x  Mini lunges 3 ways holding pair of 5# weights: backward, lateral, forward 5x each side/each leg Dead lifts 15# Kettlebell form 8 inch stool 10x (try 20# only 5 reps next time) Green band with handles rows 20x and push outs 10x each Green band PNF diagonal extensions  10x each way  2 laps 100 feet farmer's carry pair of 10# weights   TREATMENT DATE: 06/19/24 UBE 4 min while discussing response and current status Pt requested after warm up to exercise in the treatment room Seated thoracic extension with ball, hands behind head 10x Supine red band horizontal abduction 10x Supine red band diagonals 10x each way Standing push press 5# 8x right/left Squats with dowel raise 10x  Dead lifts with red power cord on dowel 10x Rows with red power cord on dowel 10x Stir the pot with red power cord on dowel 10x each way Lateral lunge with 5# reach down 10x right/left 5# overhead press 8x right/left Bil row with red power cord 10x Red power cord trunk rotation 10x each way  2 laps 100 feet farmer's carry pair of 9# weights   TREATMENT DATE:  06/14/24 UBE 5 min while discussing response and current status Pt requested after warm up to exercise in the treatment room Seated 5# weight:  V chops 10x Seated 5# on knee hip flexion and abduction over the cane 10x right/left Standing landmine press with red power cord on cane anchored under the foot 15x right/left Standing push press 5# 8x right/left Sit to stand with 8# overhead press 2 sets of 5 Standing rows blue band 10x; staggered stance 10x each way  Standing blue band PNF diagonal extensions anchored over the top of the door 15x right/left  Dead lifts 15# kettlebell  to ankle level 10x with cues to keep weight close to the body and forward gaze  2 laps 100 feet farmer's carry pair of 8# weights (increase weight to 10# next time)   TREATMENT DATE: 06/06/24 UBE 5 min while discussing response and current status Pt requested after warm up to exercise in the treatment room Seated stretches: HS, hip flexor stretch with overhead reach, UE elevation 10x  Seated 4# plyo ball:  hip to hip, chops 10x, ear to ear Sit to stand with 5# overhead press 2 sets of 5 Counter push ups 10x Standing heavy purple power cord trunk extension 10 sec hold 7x Standing heavy purple power cord 10 sec hold 7x Standing green band anchored over the top of the door: bil shoulder extension 10x Standing green band stir the pot 10x  Pallof style green band: hold at 90 degrees with march and retro step 5x each 2 rounds Standing medium pink power cord: side step 10x each direction Dead lifts pair of 8# dumbbells to knee level 10x with cues to keep weight close to the body and forward gaze 1 lap around the gym farmer's carry pair of 8# weights     TREATMENT DATE: 05/29/24 UBE 4 min ( 2 min each direction) while discussing status At the stairs 2nd step hip flexor, gastroc and quadratus lumborum muscle lengthening 2 sets of 5 right/left: Rocking forward and back with arm elevation Rocking forward and back with arm  reach up and over At the stairs 2nd step hamstring stretch 5x right/left (Added to HEP- see below) 6 inch step ups holding single 5# dumbbell 10x right/left  Seated red power cord anchored under feet, pt cued to sit tall 10x Farmer's carry around gym pair of 8#  60 feet 2 laps (Added to HEP- see below) Farmer's hold 8# march 10x Pair of 8# dead lift to knee level 10x (Added to HEP- see below) Chair sit ups single 8# with overhead press 10x (challenging) Seated thoracic extension with ball hands behind neck 10x  Seated red band shoulder horizontal abduction 2x10 (Added to HEP- see below)     PATIENT EDUCATION:  Education details: Educated patient on anatomy and physiology of current symptoms, prognosis, plan of care as well as initial self care strategies to promote recovery Person educated: Patient Education method: Explanation Education comprehension: verbalized understanding  HOME EXERCISE PROGRAM: Access Code: [email or web address] URL: [email or web address] Date: 05/29/2024 Prepared by: Glade Pesa  Exercises - Standing Hamstring Stretch with Step  - 1 x daily - 7 x weekly - 1 sets - 3 reps - 30 hold - Resisted Sit-to-Stand With Dumbbell at Chest  - 1 x daily - 7 x weekly - 1 sets - 10 reps - Farmer's Carry with Kettlebells  - 1 x daily - 7 x weekly - 1 sets - 10 reps - Half Deadlift with Kettlebell  - 1 x daily - 7 x weekly - 1 sets - 10 reps - Seated Shoulder Horizontal Abduction with Resistance  - 1 x daily - 7 x weekly - 1 sets - 10 reps  ASSESSMENT:  CLINICAL IMPRESSION: Rindy continues to make steady improvements in functional strength.  Fewer cues needed for hip hinge technique/lifting.  Able to increase dead lift weight today without production of back pain.  Will check progress toward goals including PSFS as above next visit.      OBJECTIVE IMPAIRMENTS: decreased activity tolerance, decreased strength, impaired perceived functional ability, and pain.    ACTIVITY LIMITATIONS: carrying, lifting, bending, sleeping, and locomotion level  PARTICIPATION LIMITATIONS: meal prep, cleaning, laundry, and yard work  PERSONAL FACTORS: Age, Time since onset of injury/illness/exacerbation, and 1-2 comorbidities: diabetes, HTN are also affecting patient's functional outcome.   REHAB POTENTIAL: Good  CLINICAL DECISION MAKING: Stable/uncomplicated  EVALUATION COMPLEXITY: Low   GOALS: Goals reviewed with patient? Yes  SHORT TERM GOALS: Target date: 06/12/2024    The patient will demonstrate knowledge of basic self care strategies and exercises to promote healing  Baseline: Goal status:  met 9/23  2.  Improved LE strength with 11 sit to stands in 30 sec Baseline:  Goal status: ongoing  3.  Patient will be able to lift/carry a 5# object needed for housework and gardening Baseline:  Goal status: met 9/23  4.  PSFS rating with housework: cooking, vacumning, sweeping  4/10 Baseline:  Goal status: met 9/25    LONG TERM GOALS: Target date: 07/10/2024   The patient will be independent in a safe self progression of a home exercise program to promote further recovery of function  Baseline:  Goal status: INITIAL  2.  Improved LE strength to 4+/5 needed for stooping to the ground or floor for gardening and housework Baseline:  Goal status: INITIAL  3.  The patient will have improved trunk flexor and extensor muscle strength to at least 4+/5 needed for lifting medium weight objects including emptying the water bucket   Baseline:  Goal status: INITIAL  4.  PSFS rating with housework: cooking, vacumning, sweeping  6/10 Baseline:  Goal status: INITIAL  5.  Able to walk 20 minutes 5x/week for spinal muscle conditioning and stamina Baseline:  Goal status: INITIAL   PLAN:  PT FREQUENCY: 2x/week  PT DURATION: 8 weeks  PLANNED INTERVENTIONS: [id number]-  PT Re-evaluation, 97110-Therapeutic exercises, 97530- Therapeutic activity, [id number]-  Neuromuscular re-education, [phone]- Self Care, 02859- Manual therapy, [phone]- Aquatic Therapy, [phone]- Electrical stimulation (unattended), [phone]- Electrical stimulation (manual), [id number]- Ultrasound, [phone] (1-2 muscles), [phone] (3+ muscles)- Dry Needling, Patient/Family education, Taping, Joint mobilization, Spinal mobilization, Cryotherapy, and Moist heat.  PLAN FOR NEXT SESSION:  pt requests after warm up to be treated in a treatment room;   landmine press for simulation of vacuuming; sit to stand, step ups, farmer's carries, lifting; spinal muscle strengthening and endurance  Glade Pesa, PT 07/03/24 3:13 PM Phone: [phone] Fax: [phone]

## 2024-07-10 ENCOUNTER — Ambulatory Visit: Admitting: Physical Therapy

## 2024-07-10 ENCOUNTER — Encounter: Payer: Self-pay | Admitting: Family Medicine

## 2024-07-10 ENCOUNTER — Encounter: Payer: Self-pay | Admitting: Physical Therapy

## 2024-07-10 DIAGNOSIS — M546 Pain in thoracic spine: Secondary | ICD-10-CM

## 2024-07-10 DIAGNOSIS — I10 Essential (primary) hypertension: Secondary | ICD-10-CM

## 2024-07-10 DIAGNOSIS — M6281 Muscle weakness (generalized): Secondary | ICD-10-CM

## 2024-07-10 DIAGNOSIS — M5459 Other low back pain: Secondary | ICD-10-CM

## 2024-07-10 NOTE — Therapy (Signed)
 OUTPATIENT PHYSICAL THERAPY THORACOLUMBAR PROGRESS NOTE/DISCHARGE SUMMARY      Patient Name: Heidi Davis MRN: [id number] DOB:1961-07-20, 63 y.o., female Today's Date: 07/10/2024  END OF SESSION:  PT End of Session - 07/10/24 1015     Visit Number 10    Date for Recertification  07/10/24    Authorization Type [place] Medicaid [employer] no auth required    PT Start Time 1016    PT Stop Time 1057    PT Time Calculation (min) 41 min    Activity Tolerance Patient tolerated treatment well          Past Medical History:  Diagnosis Date   Arthritis    Asthma    long times ago- 12 yrs ago. no meds   Diabetes mellitus    GERD (gastroesophageal reflux disease)    Hypertension    Past Surgical History:  Procedure Laterality Date   ABDOMINAL HYSTERECTOMY     total   COLONOSCOPY WITH PROPOFOL  N/A 11/06/2015   Procedure: COLONOSCOPY WITH PROPOFOL ;  Surgeon: Lamar Bunk, MD;  Location: [facility];  Service: Endoscopy;  Laterality: N/A;   Patient Active Problem List   Diagnosis Date Noted   Hyperlipidemia associated with type 2 diabetes mellitus (HCC) 02/10/2023   Local edema 02/10/2023   Irritable bowel syndrome with both constipation and diarrhea 11/14/2022   Chronic midline low back pain without sciatica 11/14/2022   Paresthesia 11/14/2022   Type 2 diabetes mellitus without complication, without long-term current use of insulin (HCC) 11/12/2022   HTN (hypertension) 10/20/2013   Morbid obesity (HCC) 10/20/2013    PCP: Wendolyn Jenkins Jansky MD  REFERRING PROVIDER: Leonce Katz MD  REFERRING DIAG: M54.50, G89.29 chronic midline low back pain; M47.816 facet arthritis of lumbar region; M54.6, G89.29 chronic bil thoracic back pain  Rationale for Evaluation and Treatment: Rehabilitation  THERAPY DIAG:  Pain in thoracic spine  Other low back pain  Muscle weakness  ONSET DATE: > 6 months  SUBJECTIVE:                                                                                                                                                                                            SUBJECTIVE STATEMENT:  Reports she thinks she is ready to finish with PT today.   Pt speaks Arabic and is here with her daughter [name] who translates.  PERTINENT HISTORY:  DM TII; HTN  PAIN:   Are you having pain? Yes NPRS scale: no back pain now 0/10 Pain location:  mid and upper back Pain orientation: Bilateral  PAIN TYPE: aching Pain description: intermittent  Aggravating factors: can't think of  anything; raising hand when it's really painful; sometimes wakes up at night time 1-2x/week; pain will be 2-3 days straight Relieving factors: topical rubs; cat cow   PRECAUTIONS: None  WEIGHT BEARING RESTRICTIONS: No  FALLS:  Has patient fallen in last 6 months? No LIVING ENVIRONMENT: Lives with: lives with their family Lives in: House/apartment Stairs: Flight of 8 steps Has following equipment at home: None   OCCUPATION: Retired   PLOF: Independent   PATIENT GOALS: relief overall to continue being more active  The Patient-Specific Functional Scale  Initial:  I am going to ask you to identify up to 3 important activities that you are unable to do or are having difficulty with as a result of this problem.  Today are there any activities that you are unable to do or having difficulty with because of this?  (Patient shown scale and patient rated each activity)  Follow up: When you first came in you had difficulty performing these activities.  Today do you still have difficulty?  Patient-Specific activity scoring scheme (Point to one number):  0 1 2 3 4 5 6 7 8 9  10 Unable                                                                                                          Able to perform To perform                                                                                                    activity at the same Activity         Level as  before                                                                                                                       Injury or problem  Activity      Pick up anything heavy  Initial:       0             9/25: 5-6     10/21: 8  2.            Bending over                                                                      Initial:      0                9/25: 5-6     10/21: 9  3.            Housework: vacumning, sweeping, cooking                   Initial:       3              9/25:  6        10/21: 10     OBJECTIVE:  Note: Objective measures were completed at Evaluation unless otherwise noted.  DIAGNOSTIC FINDINGS:  02/16/23 lumbar MRI IMPRESSION: Lower lumbar facet arthropathy (greatest and severe on the right at L5-S1). Mild bilateral foraminal stenosis at L4-L5. No significant canal stenosis.  COGNITION: Overall cognitive status: Within functional limits for tasks assessed      POSTURE: decreased lumbar lordosis   LUMBAR ROM: able to stoop to pick up a small object off the floor,  quadruped cat/cow with report of shoulder pain  AROM eval 10/7 10/21  Flexion WFLs WFLs WFLs  Extension 25% limited WFLs WFLs  Right lateral flexion WFLs WFLs WFLs  Left lateral flexion WFLs WFLs WFLs  Right rotation     Left rotation      (Blank rows = not tested)  TRUNK STRENGTH:  Decreased activation of transverse abdominus muscles; abdominals 4-/5; decreased activation of lumbar multifidi; trunk extensors 4-/5 10/21:  4+/5 to 5/5 trunk strength    LOWER EXTREMITY ROM:   WFLs  LOWER EXTREMITY MMT:  Hip abductors and extensors 4/5 bil  LUMBAR SPECIAL TESTS:  Negative slump test  FUNCTIONAL TESTS:  30 sec STS hands on knees 8x 9/25: 30 sec STS 9x  10/21: 11X   GAIT:  3 MWT: no device 644 feet, no pain 10/21: 6 MWT 1428   TREATMENT DATE: 07/10/24 UBE 4 min while discussing response and current status Wall ex's per  pt request: Back to the wall bil UE elevation 10x Back to the wall snow angels 10x (Added to HEP- see below) Side to the wall open books 10x each side (Added to HEP- see below) Seated band horizontal abduction 10x PSFS as above  5x STS as above 6 MWT as above     TREATMENT DATE: 07/03/24 UBE 4 min while discussing response and current status Pt requested after warm up to exercise in the treatment room Seated thoracic extension with ball  hands behind head 10x Seated open books (diagonal)  5x right/left  Seated UE elevation with coordinated breathing 10x Sit to stand with medium pink power cord anchored under feet 10x Standing blue band anchored on door rows 10x, bil shoulder extension 10x Reverse lunge at the sink 5x right/left Resisted walk medium power cord 4 ways  8x each: forward, backward, sides Dead lifts 20# from 8 inch stool 7x pt states it's good 5# bent rows 5x right/left 5# overhead press 5x right/left  2 laps 100 feet farmer's carry pair of 10# weights    TREATMENT DATE: 06/26/24 UBE 4 min while discussing response and current status Pt requested after warm up to exercise in the treatment room Standing thoracic extension with foam roll on wall, hands behind head 10x Standing foam roll on wall vertically with UE elevation 10x Seated abdominal draw in with push down on foam roll 10x  Sit ups with 5# overhead press 10x Sit to stand holding 5# weight 10x Mini lunges 3 ways holding pair of 5# weights: backward, lateral, forward 5x each side/each leg Dead lifts 15# Kettlebell form 8 inch stool 10x (try 20# only 5 reps next time) Green band with handles rows 20x and push outs 10x each Green band PNF diagonal extensions  10x each way  2 laps 100 feet farmer's carry pair of 10# weights        PATIENT EDUCATION:  Education details: Educated patient on anatomy and physiology of current symptoms, prognosis, plan of care as well as initial self care strategies to promote  recovery Person educated: Patient Education method: Explanation Education comprehension: verbalized understanding  HOME EXERCISE PROGRAM:Access Code: [email or web address] URL: [email or web address] Date: 07/10/2024 Prepared by: Glade Pesa  Exercises - Standing Hamstring Stretch with Step  - 1 x daily - 7 x weekly - 1 sets - 3 reps - 30 hold - Resisted Sit-to-Stand With Dumbbell at Chest  - 1 x daily - 7 x weekly - 1 sets - 10 reps - Farmer's Carry with Kettlebells  - 1 x daily - 7 x weekly - 1 sets - 10 reps - Half Deadlift with Kettlebell  - 1 x daily - 7 x weekly - 1 sets - 10 reps - Seated Shoulder Horizontal Abduction with Resistance  - 1 x daily - 7 x weekly - 1 sets - 10 reps - Standing Thoracic Open Book at Wall  - 1 x daily - 7 x weekly - 1 sets - 10 reps - Wall Angels  - 1 x daily - 7 x weekly - 1 sets - 10 reps A ASSESSMENT:  CLINICAL IMPRESSION: The patient has met the majority of rehab goals, with noted improvements in pain reduction, outcome score, ROM, strength and functional mobility.  A comprehensive HEP has been established and anticipate further improvements over time with regular performance of the program.  Recommend discharge from PT at this time.     OBJECTIVE IMPAIRMENTS: decreased activity tolerance, decreased strength, impaired perceived functional ability, and pain.   ACTIVITY LIMITATIONS: carrying, lifting, bending, sleeping, and locomotion level  PARTICIPATION LIMITATIONS: meal prep, cleaning, laundry, and yard work  PERSONAL FACTORS: Age, Time since onset of injury/illness/exacerbation, and 1-2 comorbidities: diabetes, HTN are also affecting patient's functional outcome.   REHAB POTENTIAL: Good  CLINICAL DECISION MAKING: Stable/uncomplicated  EVALUATION COMPLEXITY: Low   GOALS: Goals reviewed with patient? Yes  SHORT TERM GOALS: Target date: 06/12/2024    The patient will demonstrate knowledge of basic self care strategies and exercises  to promote healing  Baseline: Goal status:  met 9/23  2.  Improved LE strength with 11 sit to stands in 30 sec Baseline:  Goal status: ongoing  3.  Patient will be able to lift/carry a 5# object needed for housework and gardening Baseline:  Goal status: met 9/23  4.  PSFS rating with  housework: cooking, vacumning, sweeping  4/10 Baseline:  Goal status: met 9/25    LONG TERM GOALS: Target date: 07/10/2024   The patient will be independent in a safe self progression of a home exercise program to promote further recovery of function  Baseline:  Goal status: met  2.  Improved LE strength to 4+/5 needed for stooping to the ground or floor for gardening and housework Baseline:  Goal status: met  3.  The patient will have improved trunk flexor and extensor muscle strength to at least 4+/5 needed for lifting medium weight objects including emptying the water bucket   Baseline:  Goal status: met  4.  PSFS rating with housework: cooking, vacumning, sweeping  6/10 Baseline:  Goal status: met 10/21  5.  Able to walk 20 minutes 5x/week for spinal muscle conditioning and stamina Baseline:  Goal status: met 10/21 (more than 1 hour)   PLAN: PHYSICAL THERAPY DISCHARGE SUMMARY  Visits from Start of Care: 10  Current functional level related to goals / functional outcomes: See clinical impressions above   Remaining deficits: As above   Education / Equipment: HEP updated as above   Patient agrees to discharge. Patient goals were met. Patient is being discharged due to meeting the stated rehab goals.  Glade Pesa, PT 07/10/24 8:51 PM Phone: [phone] Fax: [phone]

## 2024-07-25 NOTE — Telephone Encounter (Signed)
 Dr Wendolyn is this dose ok to send or do you need to go up?

## 2024-07-26 ENCOUNTER — Other Ambulatory Visit: Payer: Self-pay

## 2024-07-26 DIAGNOSIS — E119 Type 2 diabetes mellitus without complications: Secondary | ICD-10-CM

## 2024-07-26 MED ORDER — SEMAGLUTIDE(0.25 OR 0.5MG/DOS) 2 MG/1.5ML ~~LOC~~ SOPN: 0.5000 mg | PEN_INJECTOR | SUBCUTANEOUS | Status: DC

## 2024-08-28 ENCOUNTER — Ambulatory Visit: Admitting: Family Medicine

## 2024-08-29 ENCOUNTER — Ambulatory Visit: Admitting: Family Medicine

## 2024-09-04 ENCOUNTER — Encounter: Payer: Self-pay | Admitting: Family Medicine

## 2024-09-11 ENCOUNTER — Encounter: Payer: Self-pay | Admitting: Family Medicine

## 2024-09-11 DIAGNOSIS — E119 Type 2 diabetes mellitus without complications: Secondary | ICD-10-CM

## 2024-09-11 MED ORDER — SEMAGLUTIDE(0.25 OR 0.5MG/DOS) 2 MG/1.5ML ~~LOC~~ SOPN: 0.5000 mg | PEN_INJECTOR | SUBCUTANEOUS | 1 refills | Status: AC

## 2024-09-14 ENCOUNTER — Ambulatory Visit: Admitting: Family Medicine

## 2024-09-24 ENCOUNTER — Encounter: Payer: Self-pay | Admitting: Family Medicine

## 2024-09-24 ENCOUNTER — Ambulatory Visit (INDEPENDENT_AMBULATORY_CARE_PROVIDER_SITE_OTHER): Admitting: Family Medicine

## 2024-09-24 ENCOUNTER — Ambulatory Visit: Payer: Self-pay | Admitting: Family Medicine

## 2024-09-24 VITALS — BP 122/76 | HR 60 | Temp 97.0°F | Ht 63.0 in | Wt 191.0 lb

## 2024-09-24 DIAGNOSIS — E1169 Type 2 diabetes mellitus with other specified complication: Secondary | ICD-10-CM

## 2024-09-24 DIAGNOSIS — E119 Type 2 diabetes mellitus without complications: Secondary | ICD-10-CM

## 2024-09-24 DIAGNOSIS — L603 Nail dystrophy: Secondary | ICD-10-CM

## 2024-09-24 DIAGNOSIS — E785 Hyperlipidemia, unspecified: Secondary | ICD-10-CM | POA: Diagnosis not present

## 2024-09-24 LAB — LIPID PANEL
Cholesterol: 136 mg/dL (ref 28–200)
HDL: 42.3 mg/dL
LDL Cholesterol: 61 mg/dL (ref 10–99)
NonHDL: 93.61
Total CHOL/HDL Ratio: 3
Triglycerides: 165 mg/dL — ABNORMAL HIGH (ref 10.0–149.0)
VLDL: 33 mg/dL (ref 0.0–40.0)

## 2024-09-24 LAB — TSH: TSH: 1.85 u[IU]/mL (ref 0.35–5.50)

## 2024-09-24 LAB — CBC WITH DIFFERENTIAL/PLATELET
Basophils Absolute: 0.1 K/uL (ref 0.0–0.1)
Basophils Relative: 0.6 % (ref 0.0–3.0)
Eosinophils Absolute: 0.5 K/uL (ref 0.0–0.7)
Eosinophils Relative: 4.4 % (ref 0.0–5.0)
HCT: 40.4 % (ref 36.0–46.0)
Hemoglobin: 13.3 g/dL (ref 12.0–15.0)
Lymphocytes Relative: 50 % — ABNORMAL HIGH (ref 12.0–46.0)
Lymphs Abs: 5.2 K/uL — ABNORMAL HIGH (ref 0.7–4.0)
MCHC: 32.9 g/dL (ref 30.0–36.0)
MCV: 86.1 fl (ref 78.0–100.0)
Monocytes Absolute: 0.5 K/uL (ref 0.1–1.0)
Monocytes Relative: 4.4 % (ref 3.0–12.0)
Neutro Abs: 4.2 K/uL (ref 1.4–7.7)
Neutrophils Relative %: 40.6 % — ABNORMAL LOW (ref 43.0–77.0)
Platelets: 252 K/uL (ref 150.0–400.0)
RBC: 4.69 Mil/uL (ref 3.87–5.11)
RDW: 15.4 % (ref 11.5–15.5)
WBC: 10.4 K/uL (ref 4.0–10.5)

## 2024-09-24 LAB — COMPREHENSIVE METABOLIC PANEL WITH GFR
ALT: 13 U/L (ref 3–35)
AST: 17 U/L (ref 5–37)
Albumin: 4.4 g/dL (ref 3.5–5.2)
Alkaline Phosphatase: 104 U/L (ref 39–117)
BUN: 11 mg/dL (ref 6–23)
CO2: 28 meq/L (ref 19–32)
Calcium: 10.3 mg/dL (ref 8.4–10.5)
Chloride: 104 meq/L (ref 96–112)
Creatinine, Ser: 0.6 mg/dL (ref 0.40–1.20)
GFR: 94.48 mL/min
Glucose, Bld: 90 mg/dL (ref 70–99)
Potassium: 3.7 meq/L (ref 3.5–5.1)
Sodium: 140 meq/L (ref 135–145)
Total Bilirubin: 0.6 mg/dL (ref 0.2–1.2)
Total Protein: 8 g/dL (ref 6.0–8.3)

## 2024-09-24 LAB — HEMOGLOBIN A1C: Hgb A1c MFr Bld: 6.9 % — ABNORMAL HIGH (ref 4.6–6.5)

## 2024-09-24 LAB — VITAMIN B12: Vitamin B-12: 1414 pg/mL — ABNORMAL HIGH (ref 211–911)

## 2024-09-24 NOTE — Progress Notes (Signed)
 Labs look great.  Can decrease dose of B12 if wants or continue as is

## 2024-09-24 NOTE — Progress Notes (Signed)
 "  Subjective:     Patient ID: Heidi Davis, female    DOB: 01/21/1961, 64 y.o.   MRN: [id number]  Chief Complaint  Patient presents with   Nail Problem    Pt states her toenails are falling off   Diabetes    Pt wants referral to Endo for diabetes    Discussed the use of AI scribe software for clinical note transcription with the patient, who gave verbal consent to proceed.  History of Present Illness Heidi Davis is a 64 year old female with diabetes who presents with toenail issues and neuropathy. Here w/her daughter who interprets  She is experiencing toenail issues, with nails falling off partially or completely, which has worsened since her last visit. Previously, she had discoloration and a burning sensation in her toenails. She consulted a podiatrist in the past year, but no significant findings were noted at that time. The current change in her toenail condition is causing concern.  She experiences a burning sensation in her toes. She is currently taking metformin  1000 mg twice daily and Ozempic  0.5 mg weekly to manage her diabetes. Her blood sugar levels have been stable, although she missed a recent appointment for blood work due to weather-related cancellations. Requesting referral to endocrinologist  She has been attending [employer] [facility] for exercise, although her attendance has been inconsistent due to cold weather. She engages in physical activity around the house to maintain her activity level.    Health Maintenance Due  Topic Date Due   HIV Screening  Never done    Past Medical History:  Diagnosis Date   Arthritis    Asthma    long times ago- 12 yrs ago. no meds   Diabetes mellitus    GERD (gastroesophageal reflux disease)    Hypertension     Past Surgical History:  Procedure Laterality Date   ABDOMINAL HYSTERECTOMY     total   COLONOSCOPY WITH PROPOFOL  N/A 11/06/2015   Procedure: COLONOSCOPY WITH PROPOFOL ;  Surgeon: Lamar Bunk, MD;   Location: [facility];  Service: Endoscopy;  Laterality: N/A;    Current Medications[1]  Allergies[2] ROS neg/noncontributory except as noted HPI/below      Objective:     BP 122/76 (BP Location: Right Arm, Patient Position: Sitting, Cuff Size: Large)   Pulse 60   Temp (!) 97 F (36.1 C) (Temporal)   Ht 5' 3 (1.6 m)   Wt 191 lb (86.6 kg)   SpO2 98%   BMI 33.83 kg/m  Wt Readings from Last 3 Encounters:  09/24/24 191 lb (86.6 kg)  05/04/24 194 lb 4 oz (88.1 kg)  05/02/24 193 lb (87.5 kg)    Physical Exam GENERAL: Well developed, well nourished, no acute distress. HEAD EYES EARS NOSE THROAT: Normocephalic, atraumatic, conjunctiva not injected, sclera nonicteric.  dorsalis pedis 2 plus bilaterally. EXTREMITIES: No edema, no clubbing, no cyanosis. MUSCULOSKELETAL: No gross abnormalities. NEUROLOGICAL: Alert and oriented x3, cranial nerves II through XII intact. PSYCHIATRIC: Normal mood, good eye contact. Toenails-some peeling, roughness.  None missing       Assessment & Plan:  Type 2 diabetes mellitus without complication, without long-term current use of insulin (HCC) -     CBC with Differential/Platelet -     Comprehensive metabolic panel with GFR -     Hemoglobin A1c -     Vitamin B12 -     TSH -     Lipid panel -     Ambulatory referral to Endocrinology  Hyperlipidemia associated with type 2 diabetes mellitus (HCC) -     Comprehensive metabolic panel with GFR -     TSH -     Lipid panel    Assessment and Plan Assessment & Plan Nail dystrophy   Toenails, except the right third toenail, show partial detachment, suggesting possible dystrophic nails. This condition is possibly linked to diabetes but may be worsened by neuropathy. Good pulses confirm adequate circulation. She is referred to a podiatrist for further evaluation and management. B12 levels are checked to rule out deficiency as a contributing factor.  Type 2 diabetes mellitus with diabetic  neuropathy   Her type 2 diabetes is well-managed with metformin  and Ozempic , though neuropathy may contribute to nail dystrophy and a burning sensation in her toes. An A1c check was missed due to an appointment cancellation. She should continue metformin  1000 mg twice daily and Ozempic  0.5 mg weekly. An A1c test is ordered to assess current glycemic control. Regular exercise and physical activity are encouraged. Pt requesting referral to endocrine  General health maintenance   Missed appointments due to weather-related cancellations highlight the importance of regular follow-up and blood work. A follow-up appointment is scheduled in one month for a comprehensive evaluation. Resumption of a regular exercise regimen is encouraged.     Return in about 4 weeks (around 10/22/2024) for chronic follow-up.  Jenkins CHRISTELLA Carrel, MD     [1]  Current Outpatient Medications:    Accu-Chek Softclix Lancets lancets, by Does not apply route., Disp: , Rfl:    atorvastatin  (LIPITOR) 40 MG tablet, Take 1 tablet (40 mg total) by mouth daily., Disp: 90 tablet, Rfl: 1   Blood Glucose Monitoring Suppl (ACCU-CHEK GUIDE) w/Device KIT, See administration instructions., Disp: , Rfl:    glucose blood test strip, 1 each by Other route daily at 12 noon., Disp: 100 each, Rfl: 3   HYDROcodone -acetaminophen  (NORCO/VICODIN) 5-325 MG tablet, Take 1-2 tablets by mouth every 6 (six) hours as needed for moderate pain (pain score 4-6)., Disp: 20 tablet, Rfl: 0   ketoconazole  (NIZORAL ) 2 % cream, Apply to skin of toes when burning is noticed.-  use daily for 10 days., Disp: 60 g, Rfl: 2   lisinopril  (ZESTRIL ) 10 MG tablet, Take 1 tablet (10 mg total) by mouth daily., Disp: 90 tablet, Rfl: 1   meloxicam  (MOBIC ) 15 MG tablet, Take 1 tablet daily for 2 weeks.  If still in pain after 2 weeks, take 1 tablet daily for an additional 1 week., Disp: 30 tablet, Rfl: 0   metFORMIN  (GLUCOPHAGE ) 1000 MG tablet, TAKE 1 TABLET (1,000 MG TOTAL) BY MOUTH  TWICE A DAY WITH FOOD, Disp: 180 tablet, Rfl: 1   Semaglutide ,0.25 or 0.5MG /DOS, 2 MG/1.5ML SOPN, Inject 0.5 mg into the skin once a week., Disp: 1.5 mL, Rfl: 1 [2]  Allergies Allergen Reactions   Bee Pollen     06/20/2015 -   Pollen Extract     06/20/2015 -   "

## 2024-09-24 NOTE — Patient Instructions (Signed)
 See podiatrist  Referral to endo

## 2024-10-02 ENCOUNTER — Ambulatory Visit (INDEPENDENT_AMBULATORY_CARE_PROVIDER_SITE_OTHER): Admitting: Podiatry

## 2024-10-02 DIAGNOSIS — M79672 Pain in left foot: Secondary | ICD-10-CM

## 2024-10-02 DIAGNOSIS — M79671 Pain in right foot: Secondary | ICD-10-CM | POA: Diagnosis not present

## 2024-10-02 DIAGNOSIS — B351 Tinea unguium: Secondary | ICD-10-CM | POA: Diagnosis not present

## 2024-10-02 MED ORDER — TERBINAFINE HCL 250 MG PO TABS: 250.0000 mg | ORAL_TABLET | Freq: Every day | ORAL | 1 refills | Status: AC

## 2024-10-02 NOTE — Progress Notes (Signed)
" ° °  Subjective:    HPI Presents with complaint of thick painful nails that causes discomfort with  walking and wearing shoes.  Have been this way for many years and have been getting worse.  Type II bilateral diabetic.  Also complains of some numbness and tingling in the toes at times.   Objective:  Physical Exam   General: AAO x3, NAD  Vascular: DP and PT pulses palpable bilaterally.  Immedate capillary fill time digits. No significant lower extremity edema bilaterally.  Dermatological:  Onychomycotic mycotic changes nails 1 through 5 with discoloration nail and subungual debris and thickening of the nail with redness along the nail folds.  Tenderness with pressure on nail plates..  Neruologic:  Grossly intact B/L.  Slight decrease in vibratory sensation feet bilaterally   musculoskeletal:  Normal lower extremity muscle strength.  Assessment:  Painful onychomycotic nails 1 through 5 bilaterally. Pain feet b/l     Plan:  - New patient office visit level 3 for evaluation and management -Discussed with with patient onychomycosis and etiology and treatment.  Discussed topical versus oral agents.  Discussed risk and benefits of both.  No history of hepatic or renal disease.  Patient would like to start oral Lamisil  treatment.  Explained that we would check labs every 6 weeks during course of treatment to monitor for any side effects. -Rx: Lamisil  250 mg p.o. daily, refill x 1 -Labs ordered today for liver function tests to monitor for any hepatic side effects from the Lamisil .  Return 6 weeks Lamisil  3 and LFTs "

## 2024-10-03 LAB — HEPATIC FUNCTION PANEL
AG Ratio: 1.3 (calc) (ref 1.0–2.5)
ALT: 11 U/L (ref 6–29)
AST: 13 U/L (ref 10–35)
Albumin: 4.3 g/dL (ref 3.6–5.1)
Alkaline phosphatase (APISO): 112 U/L (ref 37–153)
Bilirubin, Direct: 0.1 mg/dL (ref 0.0–0.2)
Globulin: 3.3 g/dL (ref 1.9–3.7)
Indirect Bilirubin: 0.6 mg/dL (ref 0.2–1.2)
Total Bilirubin: 0.7 mg/dL (ref 0.2–1.2)
Total Protein: 7.6 g/dL (ref 6.1–8.1)

## 2025-02-20 ENCOUNTER — Ambulatory Visit: Admitting: "Endocrinology

## 2025-02-25 ENCOUNTER — Ambulatory Visit: Admitting: Endocrinology
